# Patient Record
Sex: Male | Born: 1945 | Race: Black or African American | Hispanic: No | Marital: Single | Smoking: Never smoker
Health system: Southern US, Community
[De-identification: ages and names within clinical notes are randomized; demographics above are authoritative.]

## PROBLEM LIST (undated history)

## (undated) DIAGNOSIS — C61 Malignant neoplasm of prostate: Secondary | ICD-10-CM

## (undated) DIAGNOSIS — I639 Cerebral infarction, unspecified: Secondary | ICD-10-CM

## (undated) DIAGNOSIS — IMO0002 Reserved for concepts with insufficient information to code with codable children: Secondary | ICD-10-CM

## (undated) DIAGNOSIS — IMO0001 Reserved for inherently not codable concepts without codable children: Secondary | ICD-10-CM

## (undated) DIAGNOSIS — C7951 Secondary malignant neoplasm of bone: Secondary | ICD-10-CM

## (undated) DIAGNOSIS — I10 Essential (primary) hypertension: Secondary | ICD-10-CM

## (undated) HISTORY — DX: Reserved for inherently not codable concepts without codable children: IMO0001

## (undated) HISTORY — PX: EYE SURGERY: SHX253

## (undated) HISTORY — DX: Reserved for concepts with insufficient information to code with codable children: IMO0002

---

## 1999-07-26 ENCOUNTER — Encounter: Payer: Self-pay | Admitting: Emergency Medicine

## 1999-07-26 ENCOUNTER — Emergency Department (HOSPITAL_COMMUNITY): Admission: EM | Admit: 1999-07-26 | Discharge: 1999-07-26 | Payer: Self-pay | Admitting: *Deleted

## 2000-03-18 ENCOUNTER — Emergency Department (HOSPITAL_COMMUNITY): Admission: EM | Admit: 2000-03-18 | Discharge: 2000-03-18 | Payer: Self-pay | Admitting: Emergency Medicine

## 2001-05-31 ENCOUNTER — Emergency Department (HOSPITAL_COMMUNITY): Admission: EM | Admit: 2001-05-31 | Discharge: 2001-05-31 | Payer: Self-pay | Admitting: Emergency Medicine

## 2001-06-25 ENCOUNTER — Emergency Department (HOSPITAL_COMMUNITY): Admission: EM | Admit: 2001-06-25 | Discharge: 2001-06-25 | Payer: Self-pay | Admitting: Emergency Medicine

## 2001-12-07 ENCOUNTER — Emergency Department (HOSPITAL_COMMUNITY): Admission: EM | Admit: 2001-12-07 | Discharge: 2001-12-07 | Payer: Self-pay | Admitting: Emergency Medicine

## 2002-04-05 ENCOUNTER — Emergency Department (HOSPITAL_COMMUNITY): Admission: EM | Admit: 2002-04-05 | Discharge: 2002-04-05 | Payer: Self-pay | Admitting: Emergency Medicine

## 2003-08-18 ENCOUNTER — Emergency Department (HOSPITAL_COMMUNITY): Admission: EM | Admit: 2003-08-18 | Discharge: 2003-08-18 | Payer: Self-pay | Admitting: Emergency Medicine

## 2004-04-30 ENCOUNTER — Emergency Department (HOSPITAL_COMMUNITY): Admission: EM | Admit: 2004-04-30 | Discharge: 2004-04-30 | Payer: Self-pay | Admitting: Emergency Medicine

## 2004-05-02 ENCOUNTER — Emergency Department (HOSPITAL_COMMUNITY): Admission: EM | Admit: 2004-05-02 | Discharge: 2004-05-02 | Payer: Self-pay | Admitting: Emergency Medicine

## 2005-10-12 ENCOUNTER — Observation Stay (HOSPITAL_COMMUNITY): Admission: EM | Admit: 2005-10-12 | Discharge: 2005-10-13 | Payer: Self-pay | Admitting: Emergency Medicine

## 2007-08-04 ENCOUNTER — Emergency Department (HOSPITAL_COMMUNITY): Admission: EM | Admit: 2007-08-04 | Discharge: 2007-08-04 | Payer: Self-pay | Admitting: Emergency Medicine

## 2007-10-06 ENCOUNTER — Emergency Department (HOSPITAL_COMMUNITY): Admission: EM | Admit: 2007-10-06 | Discharge: 2007-10-06 | Payer: Self-pay | Admitting: Emergency Medicine

## 2007-10-23 ENCOUNTER — Emergency Department (HOSPITAL_COMMUNITY): Admission: EM | Admit: 2007-10-23 | Discharge: 2007-10-24 | Payer: Self-pay | Admitting: Emergency Medicine

## 2007-10-31 ENCOUNTER — Emergency Department (HOSPITAL_COMMUNITY): Admission: EM | Admit: 2007-10-31 | Discharge: 2007-10-31 | Payer: Self-pay | Admitting: Emergency Medicine

## 2008-01-09 ENCOUNTER — Emergency Department (HOSPITAL_COMMUNITY): Admission: EM | Admit: 2008-01-09 | Discharge: 2008-01-09 | Payer: Self-pay | Admitting: Emergency Medicine

## 2010-02-01 ENCOUNTER — Emergency Department (HOSPITAL_COMMUNITY): Admission: EM | Admit: 2010-02-01 | Discharge: 2010-02-01 | Payer: Self-pay | Admitting: Emergency Medicine

## 2011-01-04 NOTE — H&P (Signed)
Ricky Terry, Ricky Terry NO.:  [id number]   MEDICAL RECORD NO.:  [id number]          PATIENT TYPE:  INP   LOCATION:  1823                         FACILITY:  [facility]   PHYSICIAN:  Michaelyn Barter, M.D. DATE OF BIRTH:  01/20/1946   DATE OF ADMISSION:  10/11/2005  DATE OF DISCHARGE:                                HISTORY & PHYSICAL   PRIMARY CARE PHYSICIAN:  Dr. Andi Devon.   CHIEF COMPLAINT:  Light-headed.   HISTORY OF PRESENT ILLNESS:  The patient is a 65 year old male with a past  medical history of diabetes mellitus, who states that he became light-headed  at approximately 1 o'clock today.  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  The  episode lasted for approximately 2-3 minutes and then resolved.  He later  went home to lay down at approximately 4 o'clock.  He woke up an hour or two  later and states that he jumped up quickly to get out of bed to go to the  bathroom, and when he initially jumped up out of bed, he felt light-headed.  Again, this only lasted for approximately two minutes and had completely  resolved by the time he went to the bathroom and returned.  He did not pass  out during either these events.  He did not stumble or fall.  He denies  having any chest pain, no shortness of breath, no nausea or vomiting, fevers  or chills.  He did have two loose stools today.  No blood was present in his  stools.  He currently states that he feels back to his baseline.  He went on  to state that he has not been eating foods or drinking fluids as regularly  as he should.  He has not started any new meds recently.  No polyuria, no  dysuria.   PAST MEDICAL HISTORY:  Diabetes mellitus diagnosed 16 years ago.   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   ALLERGIES:  No known allergies.   HOME MEDICATIONS:  The patient  states he takes MediGlybe.   SOCIAL HISTORY:  1.  Cigarettes:  The patient stopped smoking approximately two years ago.      He started smoking at the age of 59.  He smoked up to 10 sticks a day.  2.  Alcohol:  The patient stopped drinking 22 years ago.  Started drinking      at the age of 64.  He drank primarily on the weekends.  3.  Street drugs:  The patient denies.   REVIEW OF SYSTEMS:  As per HPI.   PHYSICAL EXAMINATION:  The patient is awake, he is cooperative, he shows no  obvious distress.  VITALS:  Temperature 97.9, blood pressure 142/78, heart  rate 86, respirations 18, O2 saturation is 98% on room air.  HEENT:  Normocephalic, atraumatic.  Anicteric.  Extraocular movements are  intact.  Pupils are equally reactive to light.  Right eye has a prosthesis  present.  Dentures are present within the upper and lower regions of the  patient's mouth.  NECK:  Supple. No JVD.  Strong carotid upstrokes bilaterally.  No bruits.  No supraclavicular bruits.  No lymphadenopathy.  Thyroid is not palpable.  CARDIAC:  S1, S2 is present.  Regular rate and rhythm.  No murmur, gallops  or rubs.  No parasternal heave.  PMI was not appreciated.  RESPIRATORY:  Lungs are clear.  ABDOMEN:  Soft, nontender, nondistended.  Positive bowel sounds.  No masses.  EXTREMITIES:  No edema.  NEUROLOGIC:  The patient is alert and oriented x3.  Cranial nerves II-XII  are intact.  MUSCULOSKELETAL:  Upper and lower extremity strength 5/5.   LABS:  ABG was completed, pH 7.383, PCO2 41.1, bicarb 24.5.  Hemoglobin  13.3, hematocrit 39.0.  PT 11.7, INR 0.8, PTT 28.  Sodium 137, potassium  3.9, chloride 107, BUN 17, creatinine 0.8, glucose 165.  CK MB point of  care, 3.4, troponin 9, point of care less than 0.05, myoglobin point of care  93.6.  Urinalysis negative.   ASSESSMENT/PLAN:  Problem 1.  Presyncope.  The etiology of this is  questionable.  From the description that the patient gives, it sounds as  though  there may be an orthostatic component to his presentation.  We will  evaluate the patient from a cardiovascular versus noncardiovascular  perspective.  Will cycle the patient's cardiac enzymes x3 every 8 hours  apart, we will check a 2-D echocardiogram and also carotid Dopplers.  Problem 2.  EKG changes.  When I reviewed the patient's EKG, it shows T  waves within leads V1 with T wave inversions in V3, V4, V5, and V6.  Likewise leads 2, 3, and AVF also have nice sized T wave inversions.  The  acuity of this EKG, however, is questionable.  There are no old EKGs to  compare this one to.  The patient  is asymptomatic with regards to not  complaining of achest pain or shortness of breath.  Of concern, however, is  the patient is diabetic and diabetics in general tend to have atypical  presentations of cardiovascular disease.  Will again cycle the patient's  cardiac enzymes x3 every 8 hours apart.  Will repeat the EKG and will  monitor closely for now.  In addition, we will also start the patient on an  aspirin, and check a fasting lipid profile  Problem 3. History of diabetes mellitus.  Will verify the patient's home  medications and dose, then resume.  Will provide Accu-Cheks for now in  addition to sliding scale insulin.  Problem 4. Elevated blood pressure.  Will consider starting antihypertensive  medications, either ACE inhibitor or ARB.  Problem 5.  GI prophylaxis.  Will provide Protonix.      Michaelyn Barter, M.D.  Electronically Signed     OR/MEDQ  D:  10/12/2005  T:  10/12/2005  Job:  [id number]   cc:   [name]. Renae Gloss, M.D.  Fax: [phone]

## 2011-01-04 NOTE — Discharge Summary (Signed)
Ricky Terry, Ricky Terry                ACCOUNT NO.:  [id number]   MEDICAL RECORD NO.:  [id number]          PATIENT TYPE:  OBV   LOCATION:  5506                         FACILITY:  [facility]   PHYSICIAN:  Ricky Terry, M.D.DATE OF BIRTH:  July 13, 1946   DATE OF ADMISSION:  10/11/2005  DATE OF DISCHARGE:  10/13/2005                                 DISCHARGE SUMMARY   FINAL DIAGNOSIS:  1.  Presyncope.  2.  Abnormal electrocardiogram.  3.  Diabetes mellitus, uncontrolled, type 2.  4.  Hypertension.  5.  Hyperlipidemia.   PROCEDURE:  None.   CONSULTATIONS:  Cardiology consult, Dr. Orpah Cobb who is covering for Dr.  Donia Guiles.   BRIEF HISTORY:  [name] is a 65 year old African American male with a  history of diabetes mellitus who presented with light headedness on the day  of admission.  He had two episodes of light headedness, but he did not pass  out.  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  The second episode was at home  on his way to the bathroom.  There was no associated chest pain, shortness  of breath, nausea and vomiting, fever.  Hence, he was admitted for further  evaluation.   PAST MEDICAL HISTORY:  Significant for diabetes mellitus for 16 years.   EKG showed normal sinus rhythm with T-wave inversion in all leads except AVR  and AVL.  There was no ST elevation.  His cardiac markers at the point of  care were all negative.   Vital signs revealed blood pressure 142/78, respiratory rate 18, O2  saturations 98%.  The patient was not orthostatic.   HOSPITAL COURSE:  Problem 1:  Pre-syncope.  [name] was admitted to telemetry and there  was no abnormal rhythm on telemetry for 48 hours.  He was chest pain free.  The patient was not orthostatic.  Cardiac panels x 3 were all negative.  Cardiology consult was obtained.  Dr. Algie Coffer saw the patient and  recommended stress test, however, it  was cancelled because of the weekend  limitations.  The patient wanted to go home, hence, it was arranged to have  outpatient stress test with Dr. Shana Chute.   Problem 2:  Diabetes mellitus.  Hemoglobin A1C was 9.3.  It was felt that  the patient's diabetic control was not optimal.  He has been on Metformin  5/500 one four times a day, Actos 30 mg was added for treatment.   Problem 3:  Hyperlipidemia.  This is a new diagnosis.  Fasting lipid profile  revealed total cholesterol 243, triglyceride 77, HDL 61, LDL 167.  He was  started on Lipitor 40 mg daily.   Problem 4:  Hypertension.  Initial blood pressure on admission was 142/78  and it ranged between 138 and 147 systolic during the course of  hospitalization.  The decision was made to start on ARB and Avapro 150 mg  was initiated.   CONDITION ON DISCHARGE:  The patient was ambulating without dizziness or  symptoms.  He was chest pain free.   RECOMMENDATIONS:  Outpatient stress test with Dr. Donia Guiles.   FOLLOW UP:  Follow up with Dr. Renae Gloss as previously scheduled.   DISCHARGE MEDICATIONS:  Aspirin 325 mg p.o. daily, Metformin 5/500 one four  times a day as before, Lipitor 40 mg daily, Actos 30 mg daily, Avapro 150 mg  daily.   DISCHARGE LABORATORY DATA:  Sodium 138, potassium 4.8, chloride 110, bicarb  23, BUN 16, creatinine 0.6, white count 6, hemoglobin 12.2, hematocrit 36.3,  platelets 246, MCV 79, total cholesterol 273, triglycerides 77, HDL 51, LDL  157, hemoglobin A1C 9.3.      Ricky B. Corky Downs, M.D.  Electronically Signed     MBB/MEDQ  D:  10/23/2005  T:  10/23/2005  Job:  [id number]   cc:   [name]. Renae Gloss, M.D.  Fax: [phone]   Osvaldo Shipper. Spruill, M.D.  Fax: [phone]

## 2011-03-10 ENCOUNTER — Emergency Department (HOSPITAL_COMMUNITY): Payer: Self-pay

## 2011-03-10 ENCOUNTER — Emergency Department (HOSPITAL_COMMUNITY)
Admission: EM | Admit: 2011-03-10 | Discharge: 2011-03-10 | Disposition: A | Discharge status: Home / Self Care | Payer: Self-pay | Attending: Emergency Medicine | Admitting: Emergency Medicine

## 2011-03-10 DIAGNOSIS — R112 Nausea with vomiting, unspecified: Secondary | ICD-10-CM | POA: Insufficient documentation

## 2011-03-10 DIAGNOSIS — E119 Type 2 diabetes mellitus without complications: Secondary | ICD-10-CM | POA: Insufficient documentation

## 2011-03-10 DIAGNOSIS — Z79899 Other long term (current) drug therapy: Secondary | ICD-10-CM | POA: Insufficient documentation

## 2011-03-10 DIAGNOSIS — I10 Essential (primary) hypertension: Secondary | ICD-10-CM | POA: Insufficient documentation

## 2011-03-10 DIAGNOSIS — R51 Headache: Secondary | ICD-10-CM | POA: Insufficient documentation

## 2011-03-10 DIAGNOSIS — H5440 Blindness, one eye, unspecified eye: Secondary | ICD-10-CM | POA: Insufficient documentation

## 2011-03-10 DIAGNOSIS — E785 Hyperlipidemia, unspecified: Secondary | ICD-10-CM | POA: Insufficient documentation

## 2011-03-10 LAB — DIFFERENTIAL
Basophils Absolute: 0 10*3/uL (ref 0.0–0.1)
Basophils Relative: 0 % (ref 0–1)
Eosinophils Absolute: 0 10*3/uL (ref 0.0–0.7)
Eosinophils Relative: 0 % (ref 0–5)
Lymphocytes Relative: 12 % (ref 12–46)
Lymphs Abs: 0.7 10*3/uL (ref 0.7–4.0)
Monocytes Absolute: 0.1 10*3/uL (ref 0.1–1.0)
Monocytes Relative: 2 % — ABNORMAL LOW (ref 3–12)
Neutro Abs: 5.3 10*3/uL (ref 1.7–7.7)
Neutrophils Relative %: 86 % — ABNORMAL HIGH (ref 43–77)

## 2011-03-10 LAB — BASIC METABOLIC PANEL
GFR calc Af Amer: >60 mL/min (ref >60)
GFR calc non Af Amer: >60 mL/min (ref >60)
Potassium: 4.1 mEq/L (ref 3.5–5.1)
Sodium: 133 mEq/L — ABNORMAL LOW (ref 135–145)

## 2011-03-10 LAB — CBC
MCHC: 32.3 g/dL (ref 30.0–36.0)
Platelets: ADEQUATE 10*3/uL (ref 150–400)
RDW: 13.1 % (ref 11.5–15.5)

## 2011-05-09 ENCOUNTER — Emergency Department (HOSPITAL_COMMUNITY)
Admission: EM | Admit: 2011-05-09 | Discharge: 2011-05-10 | Disposition: A | Discharge status: Other Acute Inpatient Hospital | Payer: Self-pay | Source: Home / Self Care | Attending: Emergency Medicine | Admitting: Emergency Medicine

## 2011-05-09 ENCOUNTER — Emergency Department (HOSPITAL_COMMUNITY): Payer: Self-pay

## 2011-05-09 DIAGNOSIS — R5381 Other malaise: Secondary | ICD-10-CM | POA: Insufficient documentation

## 2011-05-09 DIAGNOSIS — R209 Unspecified disturbances of skin sensation: Secondary | ICD-10-CM | POA: Insufficient documentation

## 2011-05-09 DIAGNOSIS — G459 Transient cerebral ischemic attack, unspecified: Secondary | ICD-10-CM | POA: Insufficient documentation

## 2011-05-09 DIAGNOSIS — I10 Essential (primary) hypertension: Secondary | ICD-10-CM | POA: Insufficient documentation

## 2011-05-09 LAB — CBC
MCH: 26.7 pg (ref 26.0–34.0)
Platelets: 208 10*3/uL (ref 150–400)
RBC: 4.57 MIL/uL (ref 4.22–5.81)
WBC: 5.8 10*3/uL (ref 4.0–10.5)

## 2011-05-09 LAB — COMPREHENSIVE METABOLIC PANEL
Albumin: 3.5 g/dL (ref 3.5–5.2)
Alkaline Phosphatase: 40 U/L (ref 39–117)
BUN: 21 mg/dL (ref 6–23)
Calcium: 9.2 mg/dL (ref 8.4–10.5)
Potassium: 3.8 mEq/L (ref 3.5–5.1)
Sodium: 134 mEq/L — ABNORMAL LOW (ref 135–145)
Total Protein: 6.9 g/dL (ref 6.0–8.3)

## 2011-05-09 LAB — PROTIME-INR: Prothrombin Time: 12.5 seconds (ref 11.6–15.2)

## 2011-05-10 ENCOUNTER — Inpatient Hospital Stay (HOSPITAL_COMMUNITY): Payer: Self-pay

## 2011-05-10 ENCOUNTER — Inpatient Hospital Stay (HOSPITAL_COMMUNITY)
Admission: AD | Admit: 2011-05-10 | Discharge: 2011-05-11 | DRG: 069 | Disposition: A | Discharge status: Home / Self Care | Payer: Self-pay | Source: Other Acute Inpatient Hospital | Attending: Family Medicine | Admitting: Family Medicine

## 2011-05-10 DIAGNOSIS — E669 Obesity, unspecified: Secondary | ICD-10-CM | POA: Diagnosis present

## 2011-05-10 DIAGNOSIS — Z794 Long term (current) use of insulin: Secondary | ICD-10-CM

## 2011-05-10 DIAGNOSIS — Z79899 Other long term (current) drug therapy: Secondary | ICD-10-CM

## 2011-05-10 DIAGNOSIS — Z9001 Acquired absence of eye: Secondary | ICD-10-CM

## 2011-05-10 DIAGNOSIS — E8881 Metabolic syndrome: Secondary | ICD-10-CM | POA: Diagnosis present

## 2011-05-10 DIAGNOSIS — G459 Transient cerebral ischemic attack, unspecified: Principal | ICD-10-CM | POA: Diagnosis present

## 2011-05-10 DIAGNOSIS — Z9119 Patient's noncompliance with other medical treatment and regimen: Secondary | ICD-10-CM

## 2011-05-10 DIAGNOSIS — I10 Essential (primary) hypertension: Secondary | ICD-10-CM | POA: Diagnosis present

## 2011-05-10 DIAGNOSIS — E785 Hyperlipidemia, unspecified: Secondary | ICD-10-CM | POA: Diagnosis present

## 2011-05-10 DIAGNOSIS — Z7982 Long term (current) use of aspirin: Secondary | ICD-10-CM

## 2011-05-10 DIAGNOSIS — E119 Type 2 diabetes mellitus without complications: Secondary | ICD-10-CM | POA: Diagnosis present

## 2011-05-10 DIAGNOSIS — Z91199 Patient's noncompliance with other medical treatment and regimen due to unspecified reason: Secondary | ICD-10-CM

## 2011-05-10 LAB — GLUCOSE, CAPILLARY
Glucose-Capillary: 172 mg/dL — ABNORMAL HIGH (ref 70–99)
Glucose-Capillary: 292 mg/dL — ABNORMAL HIGH (ref 70–99)
Glucose-Capillary: 229 mg/dL — ABNORMAL HIGH (ref 70–99)

## 2011-05-10 LAB — HEMOGLOBIN A1C
Hgb A1c MFr Bld: 9.6 % — ABNORMAL HIGH (ref <5.7)
Mean Plasma Glucose: 229 mg/dL — ABNORMAL HIGH (ref <117)

## 2011-05-10 LAB — LIPID PANEL
HDL: 60 mg/dL (ref >39)
Total CHOL/HDL Ratio: 5.1 RATIO
Triglycerides: 181 mg/dL — ABNORMAL HIGH (ref <150)

## 2011-05-10 LAB — APTT: aPTT: 25 seconds (ref 24–37)

## 2011-05-10 LAB — CARDIAC PANEL(CRET KIN+CKTOT+MB+TROPI): Total CK: 136 U/L (ref 7–232)

## 2011-05-11 ENCOUNTER — Inpatient Hospital Stay (HOSPITAL_COMMUNITY): Payer: Self-pay

## 2011-05-11 LAB — GLUCOSE, CAPILLARY
Glucose-Capillary: 242 mg/dL — ABNORMAL HIGH (ref 70–99)
Glucose-Capillary: 214 mg/dL — ABNORMAL HIGH (ref 70–99)

## 2011-05-11 LAB — HEMOGLOBIN A1C
Hgb A1c MFr Bld: 9.9 % — ABNORMAL HIGH (ref <5.7)
Mean Plasma Glucose: 237 mg/dL — ABNORMAL HIGH (ref <117)

## 2011-05-11 LAB — COMPREHENSIVE METABOLIC PANEL
BUN: 17 mg/dL (ref 6–23)
CO2: 31 mEq/L (ref 19–32)
Calcium: 10.3 mg/dL (ref 8.4–10.5)
Creatinine, Ser: 0.83 mg/dL (ref 0.50–1.35)
GFR calc Af Amer: >60 mL/min (ref >60)
GFR calc non Af Amer: >60 mL/min (ref >60)
Glucose, Bld: 229 mg/dL — ABNORMAL HIGH (ref 70–99)
Total Bilirubin: 0.3 mg/dL (ref 0.3–1.2)

## 2011-05-11 NOTE — Consult Note (Signed)
NAMEMANDELA, Ricky Terry NO.:  [id number]  MEDICAL RECORD NO.:  [id number]  LOCATION:  3031                         FACILITY:  [facility]  PHYSICIAN:  Weston Settle, MD   DATE OF BIRTH:  July 28, 1946  DATE OF CONSULTATION:  05/10/2011 DATE OF DISCHARGE:                                CONSULTATION   REQUESTING PHYSICIAN:  [facility].  REASON FOR CONSULT:  TIA.  HISTORY OF PRESENT ILLNESS:  The patient is a 65 year old African American male who has had 2 episodes of right face, arm and leg weakness and numbness, each lasting 5 minutes yesterday.  This was associated with slurred speech and he came to the [facility] ER and then was transferred to [facility].  CT scan was performed, which I have reviewed personally, indicated no acute hemorrhages.  There is no hypodensities. There is effacement of the left lateral ventricle.  This was present in the past as well and may simply be artifactual in nature.  His CBC and complete metabolic panel are essentially normal except for glucose of 247, hemoglobin of 12 with MCV of 80, total cholesterol 305, triglycerides 181, HDL 60, LDL 209.  He was not taking any statin therapy.  He was also on no antiplatelet therapy at home.  He was not aware of any hypertension history, but did know that he was diabetic and was on treatment for that.  Cardiac enzymes were negative.  The patient was started on aspirin here in the hospital.  PAST MEDICAL HISTORY: 1. Diabetes. 2. Hyperlipidemia.  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  CURRENT MEDICATION:  In the hospital: 1. Aspirin 325 mg daily. 2. Lovenox 40 mg subcutaneously daily.  ALLERGIES:  NO KNOWN DRUG ALLERGIES.  SOCIAL HISTORY:  Denies smoking, alcohol or illicit drugs.  FAMILY HISTORY:  Noncontributory.  REVIEW OF SYSTEMS:  Reveals no fever, no meningismus, no diplopia, no dysphagia, no chest pain or  shortness of breath, no diarrhea or constipation.  All review of systems are negative.  PHYSICAL EXAMINATION:  VITAL SIGNS:  Blood pressure is 138/82, pulse of 80, temperature 97 degrees Fahrenheit. HEART:  Regular rate and rhythm.  S1 and S2.  No murmurs. LUNGS:  Clear to auscultation.  There are no carotid bruits. NEUROLOGIC:  Awake and alert.  Affect is normal.  She is fully oriented. Language is fluent.  Comprehension, naming, and repetition are intact. Pupils are equal and reactive.  Extraocular movements are intact.  Face is symmetrical.  Tongue is midline. MUSCULOSKELETAL:  Strength is 5/5 bilaterally in upper and lower extremities and all muscle groups.  Reflexes are +2 and symmetrical. Sensation is intact to all modalities.  Coordination is fully intact. There is no Babinski sign.  There is no Hoffman sign. There are no skin rashes.  His gait is normal including his tandem gait.  IMPRESSION/PLAN:  This is a 65 year old with 2 definite risk factors for stroke including elevated cholesterol and diabetes, who presents with 2 brief periods of transient ischemic attack, likely involving the left internal capsule.  His blood pressure is currently well-controlled off medication and therefore, he may never  have had an essential hypertension.  His diabetic medications should be restarted and adjusted based on his hemoglobin A1c level.  I do recommend starting a statin for his hypercholesterolemia with a goal LDL being less than 100.  The patient does not smoke and denies any use of cocaine or amphetamines.  I do recommend doing an MR angiogram of the brain to assess his intracranial vasculature for any stenosis for risk factor stratification.  He is going to be in the MR machine and MRI could also be obtained at the same time, but expect that to be normal since this was a transient ischemic attack.  He should also receive testing for carotid Doppler ultrasound and transthoracic  echocardiogram once again to establish risk-factor stratification.  The patient is started on aspirin and I agree with that, but the dose should be decreased to 81 mg a day to reduce GI toxicity.          ______________________________ Weston Settle, MD     SE/MEDQ  D:  05/10/2011  T:  05/10/2011  Job:  [id number]  Electronically Signed by Weston Settle MD on 05/11/2011 04:24:15 PM

## 2011-05-14 LAB — GLUCOSE, CAPILLARY: Glucose-Capillary: 262 mg/dL — ABNORMAL HIGH (ref 70–99)

## 2011-05-15 LAB — URINALYSIS, ROUTINE W REFLEX MICROSCOPIC
Bilirubin Urine: NEGATIVE
Ketones, ur: NEGATIVE
Nitrite: NEGATIVE
Protein, ur: NEGATIVE
Urobilinogen, UA: 0.2

## 2011-05-15 LAB — POCT I-STAT, CHEM 8: Chloride: 104

## 2011-05-20 NOTE — Discharge Summary (Signed)
[name], [name] NO.:  [id number]  MEDICAL RECORD NO.:  [id number]  LOCATION:  3031                         FACILITY:  [facility]  PHYSICIAN:  Pleas Koch, MD        DATE OF BIRTH:  1946-08-15  DATE OF ADMISSION:  05/10/2011 DATE OF DISCHARGE:                              DISCHARGE SUMMARY   DISCHARGE DIAGNOSES: 1. Transient ischemic attack, likely involving left internal capsule. 2. Diabetes mellitus with an A1c of 9.6. 3. Hyperlipidemia with LDL of 209 and total cholesterol 305,     triglycerides 181. 4. Obesity and metabolic syndrome. 5. Hypertension which is moderately controlled only. 6. Medication noncompliance.  DISCHARGE MEDICATIONS:  Are as follows, 1. Metformin 1000 mg b.i.d.  I have discontinued his Januvia. 2. Crestor 20 mg at bedtime. 3. Insulin 70/30 Novolin 5 units b.i.d. (the patient to demonstrate     ease of use of this prior to discharge). 4. Aspirin 81 mg. 5. HCTZ 12.5 mg daily. 6. Blood glucose monitor, strips and lancet. 7. Hydrochlorothiazide 25 mg daily.  PERTINENT IMAGING STUDIES: 1. CT head May 09, 2011, showed no evidence of acute     intracranial abnormality. 2. MRI of head was not performed given the patient's extreme     claustrophobia and per Neurology's discretion, it can be pursued as     an outpatient. 3. Carotid ultrasound showed no ICA stenosis bilaterally. 4. Echocardiogram done on May 10, 2011, showed LVEF of 60% to     65% grade 1 diastolic dysfunction.  No regional wall motion     abnormalities in pattern, mild LVH.  HISTORY OF PRESENT ILLNESS:  Ricky Terry is a 65 year old African American male with two episodes of facial weakness who presented to [facility] Emergency Room 30 minutes to 1 hour with slurred speech and dense right hemiparesis and denied any palpitations, chest pain, or shortness of breath.  He had fully resolved on presentation to the emergency room, hence t-PA was administered.  The  patient was admitted by [facility] for further workup.  Pertinent positives on admission blood pressure 166/92, pulse 89, respirations 16, and temperature 93.  Facial symmetry, fluent speech.  Throat clear.  Uvula midline.  Right eye enucleation.  Neurologically, Babinski intact.  Intact strength bilaterally equal.  No murmurs, rubs, or gallops.  HOSPITAL COURSE: 1. This is 65 year old who had a ABCD 2 score of 5-6 presented with     TIA, which resolved completely.  He was worked up and had above     studies, which proved negative.  Unfortunately, he was very     claustrophobic and did not have an MRI.  Neurology was consulted     regarding the case and they recommended decreasing his dose of     aspirin from 325-81 mg with aggressive risk factor modification     inclusive of hypertension control, lipid control, and diabetes     control for microvascular issues.  He will need to follow up in     Neurology in the outpatient setting. 2. Hyperlipidemia.  His LDL cholesterol was actually 209, which was     high risk for stroke in addition to  microvascular complications.     He was started on Crestor and will continue on Crestor 20 mg at     bedtime.  If he is not able to afford this, a suitable alternative     would be Pravachol 40-80 mg per discussion with primary care     physician, Andi Devon. 3. Diabetes mellitus.  The patient states that he is conversant with     using insulin, but refuses to take it because it is too expensive.     I did thorough check of his medications and spoke with pharmacy.  I     discovered Januvia actually costs 100 dollars where as a vile of     70/30 Novolin insulin cost 24,080 cents.  I am discharging the     patient on that condition that the patient can give himself insulin     twice daily with 70/30 and insulin which I am starting at 5 units     b.i.d.  I have also given him prescription for blood glucose meter,     monitor, and strips.   The patient will use this strips and monitor     and will present himself to Dr. Renae Gloss with readings of his blood     sugars so that infant can either be up or down titrated.  The     patient is agreeable with this plan. 4. Diastolic dysfunction with EF as above.  He has diastolic     dysfunction and will likely need to have aggressive risk factor     modification in view of this.  He may benefit from being on a     nitrate versus, he may benefit from being on hydralazine versus an     ACE inhibitor for afterload reduction in the near future.  However,     I will leave this up to primary care physician, Dr. Renae Gloss who     knows him lot better than I do. 5. The patient is to call Dr. Renae Gloss for an appointment in 1-2 days     and get an appointment by Wednesday.  DISCHARGE PHYSICAL EXAMINATION:  VITAL SIGNS:  Temperature is 97.8, pulse of 86, respirations 18, blood pressure 146-158 over 80-76, and O2 sat is 98% on room air. GENERAL:  The patient was alert and oriented.  No chest pain, no shortness of breath, no nausea, no weakness, and no focal deficit. LUNGS:  CTAB. HEART:  S1, S2.  No murmurs, rubs, or gallops. ABDOMEN:  Soft, nontender, nondistended. LOWER EXTREMITIES:  Not swollen, non distended.  It was a pleasure taking care of this patient.  I spent over 30 minutes in time coordinating his care and discharge.          ______________________________ Pleas Koch, MD     JS/MEDQ  D:  05/11/2011  T:  05/11/2011  Job:  [id number]  cc:   [facility]  Electronically Signed by Pleas Koch MD on 05/20/2011 04:05:03 PM

## 2011-06-02 NOTE — H&P (Signed)
Ricky Terry, HOUSAND NO.:  [id number]  MEDICAL RECORD NO.:  [id number]  LOCATION:  WLED                         FACILITY:  [facility]  PHYSICIAN:  Houston Siren, MD           DATE OF BIRTH:  07-07-1946  DATE OF ADMISSION:  05/09/2011 DATE OF DISCHARGE:                             HISTORY & PHYSICAL   PRIMARY CARE PHYSICIAN:  [name]. Renae Gloss, MD  ADVANCE DIRECTIVE:  Full code.  REASON FOR ADMISSION:  TIA versus CVA of the dominant hemisphere.  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  He reports no headache, nausea, vomiting, or diaphoresis.  He denied any palpitations, chest pain, or shortness of breath.  Evaluation in the emergency room included an EKG which showed sinus rhythm at 88 with T-wave inversions in the precordial leads.  His head CT was negative.  He has a normal white count of 5800, hemoglobin of 12.2.  His INR is 0.91 and his blood sugar is 247.  Hospitalist was asked to admit the patient for TIA and stroke workup.  Currently, he is totally asymptomatic, back to his baseline.  PAST MEDICAL HISTORY:  Hypertension, hypercholesterolemia, diabetes, status post right eye enucleation, noncompliance.  CURRENT MEDICATIONS:  Januvia, metformin.  He is not on aspirin nor on any cholesterol medication.  FAMILY HISTORY:  Strong family history of cardiovascular disease including TIA, stroke.  SOCIAL HISTORY:  He denied tobacco, alcohol, or drug use.  He has children at his bedside.  ALLERGIES:  No known drug allergies.  PHYSICAL EXAMINATION:  VITAL SIGNS:  Blood pressure 156/82, pulse of 89, respiratory rate of 16, temperature 98.3. HEENT:  He has facial symmetry with fluent  speech.  Tongue is midline. He has upper denture.  Throat is clear.  Uvula elevates with phonation. His right eye is enucleated and has prosthesis.  Left eye reactive and his extraocular muscles intact. NECK:  Supple.  I did not hear any carotid bruit. CARDIAC:  Revealed S1 and S2, regular.  There was no murmur, rub, or gallop. LUNGS:  Clear. ABDOMEN:  Obese, nondistended, nontender. EXTREMITIES:  Showed no edema.  No calf tenderness.  He has good distal pulses bilaterally. SKIN:  Warm and dry. NEUROLOGIC: Babinski is flexion.  Strength equal bilaterally. PSYCHIATRIC:  Unremarkable as well.  PERTINENT DATA:  CAT scan is negative.  White count 5800, blood glucose 247, creatinine 0.85.  EKG, sinus rhythm.  IMPRESSION:  This is a 65 year old with ABCD2 score of 5-6, presents with transient ischemic attack or cerebrovascular accident of the dominant hemisphere.  He will need to be admitted to [facility] and will need a full stroke workup.  He was given an aspirin a day and he should continue on this.  We will check his lipid profile.  He will need echo of his heart, ultrasound of his carotid, MRI and MRA of his brain.  I told him it is important  to get his LDL to less than 70, and he needs behavior changes as we discussed.  He is stable otherwise, and we will give him insulin sliding scale.     Houston Siren, MD     PL/MEDQ  D:  05/09/2011  T:  05/09/2011  Job:  [id number]  Electronically Signed by Houston Siren  on 06/02/2011 02:55:15 PM

## 2011-07-06 ENCOUNTER — Other Ambulatory Visit: Payer: Self-pay

## 2011-07-06 ENCOUNTER — Emergency Department (HOSPITAL_COMMUNITY)
Admission: EM | Admit: 2011-07-06 | Discharge: 2011-07-06 | Disposition: A | Discharge status: Home / Self Care | Payer: Self-pay | Attending: Emergency Medicine | Admitting: Emergency Medicine

## 2011-07-06 ENCOUNTER — Encounter: Payer: Self-pay | Admitting: Emergency Medicine

## 2011-07-06 DIAGNOSIS — R42 Dizziness and giddiness: Secondary | ICD-10-CM | POA: Insufficient documentation

## 2011-07-06 DIAGNOSIS — Z79899 Other long term (current) drug therapy: Secondary | ICD-10-CM | POA: Insufficient documentation

## 2011-07-06 DIAGNOSIS — E119 Type 2 diabetes mellitus without complications: Secondary | ICD-10-CM | POA: Insufficient documentation

## 2011-07-06 DIAGNOSIS — R739 Hyperglycemia, unspecified: Secondary | ICD-10-CM

## 2011-07-06 HISTORY — DX: Cerebral infarction, unspecified: I63.9

## 2011-07-06 LAB — BASIC METABOLIC PANEL
CO2: 25 mEq/L (ref 19–32)
Calcium: 9.9 mg/dL (ref 8.4–10.5)
Chloride: 98 mEq/L (ref 96–112)
Creatinine, Ser: 0.74 mg/dL (ref 0.50–1.35)
GFR calc Af Amer: >90 mL/min (ref >90)
Sodium: 134 mEq/L — ABNORMAL LOW (ref 135–145)

## 2011-07-06 LAB — CBC
HCT: 37.3 % — ABNORMAL LOW (ref 39.0–52.0)
MCV: 80.9 fL (ref 78.0–100.0)
Platelets: 218 10*3/uL (ref 150–400)
RBC: 4.61 MIL/uL (ref 4.22–5.81)
RDW: 13.4 % (ref 11.5–15.5)
WBC: 5.8 10*3/uL (ref 4.0–10.5)

## 2011-07-06 LAB — DIFFERENTIAL
Basophils Absolute: 0 10*3/uL (ref 0.0–0.1)
Eosinophils Relative: 2 % (ref 0–5)
Lymphocytes Relative: 28 % (ref 12–46)
Lymphs Abs: 1.7 10*3/uL (ref 0.7–4.0)
Neutro Abs: 3.6 10*3/uL (ref 1.7–7.7)

## 2011-07-06 LAB — GLUCOSE, CAPILLARY: Glucose-Capillary: 283 mg/dL — ABNORMAL HIGH (ref 70–99)

## 2011-07-06 LAB — POCT I-STAT TROPONIN I: Troponin i, poc: 0.01 ng/mL (ref 0.00–0.08)

## 2011-07-06 MED ORDER — HYDROCHLOROTHIAZIDE 25 MG PO TABS: 25.0000 mg | ORAL_TABLET | Freq: Every day | ORAL | Status: DC

## 2011-07-06 NOTE — ED Notes (Signed)
Pt given discharge instructions and verbalizes understanding  

## 2011-07-06 NOTE — ED Provider Notes (Addendum)
History     CSN: [id number] Arrival date & time: 07/06/2011 12:39 AM   First MD Initiated Contact with Patient 07/06/11 0057      Chief Complaint  Patient presents with  . Dizziness  . Weakness    (Consider location/radiation/quality/duration/timing/severity/associated sxs/prior treatment) HPI  Past Medical History  Diagnosis Date  . Stroke   . Diabetes mellitus     History reviewed. No pertinent past surgical history.  Family History  Problem Relation Age of Onset  . Cancer Mother   . Hypertension Mother   . Hypertension Father   . Cancer Father   . Diabetes Sister   . Hypertension Sister   . Hypertension Brother   . Diabetes Brother     History  Substance Use Topics  . Smoking status: Never Smoker   . Smokeless tobacco: Not on file  . Alcohol Use: No      Review of Systems  Allergies  Review of patient's allergies indicates no known allergies.  Home Medications   Current Outpatient Rx  Name Route Sig Dispense Refill  . HYDROCHLOROTHIAZIDE 25 MG PO TABS Oral Take 25 mg by mouth daily.      Marland Kitchen METFORMIN HCL 1000 MG PO TABS Oral Take 1,000 mg by mouth 2 (two) times daily with a meal.        BP 155/92  Pulse 97  Temp(Src) 97.9 F (36.6 C) (Oral)  Resp 18  SpO2 97%  Physical Exam  Neurological:       Grip strength is equal, no pronator drift, cranial nerves II through XII intact as tested, gait is examined, and is completely normal. Motor strength 5 out of 5 in upper and lower extremities. Reflexes. Normal. Essentially normal physical exam. Coordination is normal    ED Course  Procedures (including critical care time)  Labs Reviewed  GLUCOSE, CAPILLARY - Abnormal; Notable for the following:    Glucose-Capillary 283 (*)    All other components within normal limits  BASIC METABOLIC PANEL - Abnormal; Notable for the following:    Sodium 134 (*)    Glucose, Bld 284 (*)    All other components within normal limits  CBC - Abnormal; Notable for  the following:    Hemoglobin 12.2 (*)    HCT 37.3 (*)    All other components within normal limits  DIFFERENTIAL  POCT I-STAT TROPONIN I  POCT CBG MONITORING  I-STAT TROPONIN I   No results found.   No diagnosis found.    MDM  Pt is seen and examined;  Initial history and physical completed.  Will follow.          Markan Cazarez A. Patrica Duel, MD 07/06/11 0203  3:03 AM   Date: 07/06/2011  Rate: 87  Rhythm: normal sinus rhythm  QRS Axis: normal  Intervals: normal  ST/T Wave abnormalities: nonspecific ST changes  Conduction Disutrbances:incomplete RBBB  Narrative Interpretation:   Old EKG Reviewed: unchanged Diffuse ST elevation, which is compared to February of 2007, unchanged. Diffuse T-wave inversion also unchanged, abnormal, but unchanged EKG from previous studies.   3:10 AM The patient remains asymptomatic. Will be discharged home in stable condition. Strongly encouraged to followup with his primary care doctor. Patient knows to return to the ED for any concerns or recurring symptoms.    Ronny Korff A. Patrica Duel, MD 07/06/11 [id number]

## 2011-07-06 NOTE — ED Notes (Signed)
Pt states he is having weakness in his lower extremities and feels dizzy  Pt states he had a recent stroke so he wanted to come in for evaluation

## 2011-07-06 NOTE — ED Notes (Signed)
Pt to ED with concerns of feeling dizziness and having some weakness in his legs that have now subsided. Pt states recently had a stroke and wanted to be eval. Pt also reports an elevation in his FSBS. Pt in gown. Pt awaits res eval

## 2011-10-25 ENCOUNTER — Emergency Department (HOSPITAL_COMMUNITY): Payer: Self-pay

## 2011-10-25 ENCOUNTER — Other Ambulatory Visit: Payer: Self-pay

## 2011-10-25 ENCOUNTER — Emergency Department (HOSPITAL_COMMUNITY)
Admission: EM | Admit: 2011-10-25 | Discharge: 2011-10-25 | Disposition: A | Discharge status: Home / Self Care | Payer: Self-pay | Attending: Emergency Medicine | Admitting: Emergency Medicine

## 2011-10-25 ENCOUNTER — Encounter (HOSPITAL_COMMUNITY): Payer: Self-pay | Admitting: Emergency Medicine

## 2011-10-25 DIAGNOSIS — R42 Dizziness and giddiness: Secondary | ICD-10-CM | POA: Insufficient documentation

## 2011-10-25 DIAGNOSIS — Z79899 Other long term (current) drug therapy: Secondary | ICD-10-CM | POA: Insufficient documentation

## 2011-10-25 DIAGNOSIS — R05 Cough: Secondary | ICD-10-CM | POA: Insufficient documentation

## 2011-10-25 DIAGNOSIS — I10 Essential (primary) hypertension: Secondary | ICD-10-CM | POA: Insufficient documentation

## 2011-10-25 DIAGNOSIS — Z8673 Personal history of transient ischemic attack (TIA), and cerebral infarction without residual deficits: Secondary | ICD-10-CM | POA: Insufficient documentation

## 2011-10-25 DIAGNOSIS — R079 Chest pain, unspecified: Secondary | ICD-10-CM | POA: Insufficient documentation

## 2011-10-25 DIAGNOSIS — R059 Cough, unspecified: Secondary | ICD-10-CM | POA: Insufficient documentation

## 2011-10-25 DIAGNOSIS — E119 Type 2 diabetes mellitus without complications: Secondary | ICD-10-CM | POA: Insufficient documentation

## 2011-10-25 HISTORY — DX: Essential (primary) hypertension: I10

## 2011-10-25 LAB — POCT I-STAT TROPONIN I

## 2011-10-25 LAB — POCT I-STAT, CHEM 8
BUN: 13 mg/dL (ref 6–23)
Chloride: 104 mEq/L (ref 96–112)
Creatinine, Ser: 0.7 mg/dL (ref 0.50–1.35)
Glucose, Bld: 249 mg/dL — ABNORMAL HIGH (ref 70–99)
Hemoglobin: 12.9 g/dL — ABNORMAL LOW (ref 13.0–17.0)
Potassium: 4.2 mEq/L (ref 3.5–5.1)
Sodium: 137 mEq/L (ref 135–145)

## 2011-10-25 LAB — DIFFERENTIAL
Eosinophils Absolute: 0.1 10*3/uL (ref 0.0–0.7)
Lymphocytes Relative: 36 % (ref 12–46)
Lymphs Abs: 1.9 10*3/uL (ref 0.7–4.0)
Monocytes Relative: 6 % (ref 3–12)
Neutro Abs: 2.9 10*3/uL (ref 1.7–7.7)
Neutrophils Relative %: 56 % (ref 43–77)

## 2011-10-25 LAB — CBC
Hemoglobin: 12.2 g/dL — ABNORMAL LOW (ref 13.0–17.0)
Platelets: 201 10*3/uL (ref 150–400)
RBC: 4.7 MIL/uL (ref 4.22–5.81)
WBC: 5.2 10*3/uL (ref 4.0–10.5)

## 2011-10-25 MED ORDER — LISINOPRIL 10 MG PO TABS: 10.0000 mg | ORAL_TABLET | Freq: Every day | ORAL | Status: DC

## 2011-10-25 NOTE — ED Notes (Signed)
Pt presented to the ER with c/o dizziness and HTN, pt states this morning BP was elevated in 180, pt has medication but not taking since he has some side effects, states he did inform his PCP, but they still did not change the meds.

## 2011-10-25 NOTE — ED Provider Notes (Signed)
And is now his second injection at the below-knee and a 20 cc less than 1 History     CSN: [id number]  Arrival date & time 10/25/11  2130   First MD Initiated Contact with Patient 10/25/11 0505      Chief Complaint  Patient presents with  . Dizziness  . Hypertension    (Consider location/radiation/quality/duration/timing/severity/associated sxs/prior treatment) Patient is a 66 y.o. male presenting with hypertension. The history is provided by the patient.  Hypertension This is a new problem. Associated symptoms include chest pain. Pertinent negatives include no abdominal pain, no headaches and no shortness of breath.   patient stated he felt dizzy had hypertension since last night. He states this morning his blood pressure was elevated 280. He is posterior be on hydrochlorothiazide, states she's been having side effects and he have not changed it. He states he has had an occasional episode of dull chest pain. He does not have chest pain now. He states he feels dizzy when his blood pressure goes up. Is no headache. No localizing numbness or weakness. No confusion.  Past Medical History  Diagnosis Date  . Stroke   . Diabetes mellitus   . Hypertension     History reviewed. No pertinent past surgical history.  Family History  Problem Relation Age of Onset  . Cancer Mother   . Hypertension Mother   . Hypertension Father   . Cancer Father   . Diabetes Sister   . Hypertension Sister   . Hypertension Brother   . Diabetes Brother     History  Substance Use Topics  . Smoking status: Never Smoker   . Smokeless tobacco: Not on file  . Alcohol Use: No      Review of Systems  Constitutional: Negative for activity change and appetite change.  HENT: Negative for neck stiffness.   Eyes: Negative for pain.  Respiratory: Negative for chest tightness and shortness of breath.   Cardiovascular: Positive for chest pain. Negative for leg swelling.  Gastrointestinal: Negative for  nausea, vomiting, abdominal pain and diarrhea.  Genitourinary: Negative for flank pain.  Musculoskeletal: Negative for back pain.  Skin: Negative for rash.  Neurological: Positive for dizziness. Negative for weakness, numbness and headaches.  Psychiatric/Behavioral: Negative for behavioral problems.    Allergies  Review of patient's allergies indicates no known allergies.  Home Medications   Current Outpatient Rx  Name Route Sig Dispense Refill  . HYDROCHLOROTHIAZIDE 12.5 MG PO CAPS Oral Take 12.5 mg by mouth daily.    Marland Kitchen METFORMIN HCL 1000 MG PO TABS Oral Take 1,000 mg by mouth 2 (two) times daily with a meal.      . LISINOPRIL 10 MG PO TABS Oral Take 1 tablet (10 mg total) by mouth daily. 14 tablet 0    BP 142/66  Pulse 79  Temp(Src) 97.3 F (36.3 C) (Oral)  Resp 16  Ht 5\' 6"  (1.676 m)  Wt 161 lb (73.029 kg)  BMI 25.99 kg/m2  SpO2 99%  Physical Exam  Nursing note and vitals reviewed. Constitutional: He is oriented to person, place, and time. He appears well-developed and well-nourished.  HENT:  Head: Normocephalic and atraumatic.  Eyes: EOM are normal. Pupils are equal, round, and reactive to light.  Cardiovascular: Normal rate, regular rhythm and normal heart sounds.   No murmur heard. Pulmonary/Chest: Effort normal and breath sounds normal.  Abdominal: Soft. Bowel sounds are normal. He exhibits no distension and no mass. There is no tenderness. There is no rebound and  no guarding.  Musculoskeletal: Normal range of motion. He exhibits no edema.  Neurological: He is alert and oriented to person, place, and time. No cranial nerve deficit.  Skin: Skin is warm and dry.  Psychiatric: He has a normal mood and affect.    ED Course  Procedures (including critical care time)  Labs Reviewed  CBC - Abnormal; Notable for the following:    Hemoglobin 12.2 (*)    HCT 37.4 (*)    All other components within normal limits  POCT I-STAT, CHEM 8 - Abnormal; Notable for the  following:    Glucose, Bld 249 (*)    Hemoglobin 12.9 (*)    HCT 38.0 (*)    All other components within normal limits  DIFFERENTIAL  POCT I-STAT TROPONIN I   Dg Chest 2 View  10/25/2011  *RADIOLOGY REPORT*  Clinical Data: Mid chest pain, cough, past history of stroke, diabetes, hypertension  CHEST - 2 VIEW  Comparison: None  Findings: Normal heart size, mediastinal contours, and pulmonary vascularity. Lungs clear. No pleural effusion or pneumothorax. Osseous structures normal appearance.  IMPRESSION: Normal exam.  Original Report Authenticated By: Lollie Marrow, M.D.     1. Hypertension     Date: 10/25/2011  Rate: 76  Rhythm: normal sinus rhythm  QRS Axis: normal  Intervals: normal  ST/T Wave abnormalities: nonspecific ST/T changes  Conduction Disutrbances:none  Narrative Interpretation: ST T wave changes in multiple leads. It has been stable between police to previous EKGs though.  Old EKG Reviewed: unchanged     MDM  Hypertension at home. His been off his medications because he states it feels like the light turned in his eyes when he takes it. No signs of endorgan damage. His EKG is abnormal but unchanged. He'll be started on lisinopril and sent home to follow with Dr. Renae Gloss.        Juliet Rude. Rubin Payor, MD 10/25/11 [phone]

## 2011-10-25 NOTE — Discharge Instructions (Signed)
Arterial Hypertension Arterial hypertension (high blood pressure) is a condition of elevated pressure in your blood vessels. Hypertension over a long period of time is a risk factor for strokes, heart attacks, and heart failure. It is also the leading cause of kidney (renal) failure.  CAUSES   In Adults -- Over 90% of all hypertension has no known cause. This is called essential or primary hypertension. In the other 10% of people with hypertension, the increase in blood pressure is caused by another disorder. This is called secondary hypertension. Important causes of secondary hypertension are:   Heavy alcohol use.   Obstructive sleep apnea.   Hyperaldosterosim (Conn's syndrome).   Steroid use.   Chronic kidney failure.   Hyperparathyroidism.   Medications.   Renal artery stenosis.   Pheochromocytoma.   Cushing's disease.   Coarctation of the aorta.   Scleroderma renal crisis.   Licorice (in excessive amounts).   Drugs (cocaine, methamphetamine).  Your caregiver can explain any items above that apply to you.  In Children -- Secondary hypertension is more common and should always be considered.   Pregnancy -- Few women of childbearing age have high blood pressure. However, up to 10% of them develop hypertension of pregnancy. Generally, this will not harm the woman. It may be a sign of 3 complications of pregnancy: preeclampsia, HELLP syndrome, and eclampsia. Follow up and control with medication is necessary.  SYMPTOMS   This condition normally does not produce any noticeable symptoms. It is usually found during a routine exam.   Malignant hypertension is a late problem of high blood pressure. It may have the following symptoms:   Headaches.   Blurred vision.   End-organ damage (this means your kidneys, heart, lungs, and other organs are being damaged).   Stressful situations can increase the blood pressure. If a person with normal blood pressure has their blood  pressure go up while being seen by their caregiver, this is often termed "white coat hypertension." Its importance is not known. It may be related with eventually developing hypertension or complications of hypertension.   Hypertension is often confused with mental tension, stress, and anxiety.  DIAGNOSIS  The diagnosis is made by 3 separate blood pressure measurements. They are taken at least 1 week apart from each other. If there is organ damage from hypertension, the diagnosis may be made without repeat measurements. Hypertension is usually identified by having blood pressure readings:  Above 140/90 mmHg measured in both arms, at 3 separate times, over a couple weeks.   Over 130/80 mmHg should be considered a risk factor and may require treatment in patients with diabetes.  Blood pressure readings over 120/80 mmHg are called "pre-hypertension" even in non-diabetic patients. To get a true blood pressure measurement, use the following guidelines. Be aware of the factors that can alter blood pressure readings.  Take measurements at least 1 hour after caffeine.   Take measurements 30 minutes after smoking and without any stress. This is another reason to quit smoking - it raises your blood pressure.   Use a proper cuff size. Ask your caregiver if you are not sure about your cuff size.   Most home blood pressure cuffs are automatic. They will measure systolic and diastolic pressures. The systolic pressure is the pressure reading at the start of sounds. Diastolic pressure is the pressure at which the sounds disappear. If you are elderly, measure pressures in multiple postures. Try sitting, lying or standing.   Sit at rest for a minimum of   5 minutes before taking measurements.   You should not be on any medications like decongestants. These are found in many cold medications.   Record your blood pressure readings and review them with your caregiver.  If you have hypertension:  Your caregiver  may do tests to be sure you do not have secondary hypertension (see "causes" above).   Your caregiver may also look for signs of metabolic syndrome. This is also called Syndrome X or Insulin Resistance Syndrome. You may have this syndrome if you have type 2 diabetes, abdominal obesity, and abnormal blood lipids in addition to hypertension.   Your caregiver will take your medical and family history and perform a physical exam.   Diagnostic tests may include blood tests (for glucose, cholesterol, potassium, and kidney function), a urinalysis, or an EKG. Other tests may also be necessary depending on your condition.  PREVENTION  There are important lifestyle issues that you can adopt to reduce your chance of developing hypertension:  Maintain a normal weight.   Limit the amount of salt (sodium) in your diet.   Exercise often.   Limit alcohol intake.   Get enough potassium in your diet. Discuss specific advice with your caregiver.   Follow a DASH diet (dietary approaches to stop hypertension). This diet is rich in fruits, vegetables, and low-fat dairy products, and avoids certain fats.  PROGNOSIS  Essential hypertension cannot be cured. Lifestyle changes and medical treatment can lower blood pressure and reduce complications. The prognosis of secondary hypertension depends on the underlying cause. Many people whose hypertension is controlled with medicine or lifestyle changes can live a normal, healthy life.  RISKS AND COMPLICATIONS  While high blood pressure alone is not an illness, it often requires treatment due to its short- and long-term effects on many organs. Hypertension increases your risk for:  CVAs or strokes (cerebrovascular accident).   Heart failure due to chronically high blood pressure (hypertensive cardiomyopathy).   Heart attack (myocardial infarction).   Damage to the retina (hypertensive retinopathy).   Kidney failure (hypertensive nephropathy).  Your caregiver can  explain list items above that apply to you. Treatment of hypertension can significantly reduce the risk of complications. TREATMENT   For overweight patients, weight loss and regular exercise are recommended. Physical fitness lowers blood pressure.   Mild hypertension is usually treated with diet and exercise. A diet rich in fruits and vegetables, fat-free dairy products, and foods low in fat and salt (sodium) can help lower blood pressure. Decreasing salt intake decreases blood pressure in a 1/3 of people.   Stop smoking if you are a smoker.  The steps above are highly effective in reducing blood pressure. While these actions are easy to suggest, they are difficult to achieve. Most patients with moderate or severe hypertension end up requiring medications to bring their blood pressure down to a normal level. There are several classes of medications for treatment. Blood pressure pills (antihypertensives) will lower blood pressure by their different actions. Lowering the blood pressure by 10 mmHg may decrease the risk of complications by as much as 25%. The goal of treatment is effective blood pressure control. This will reduce your risk for complications. Your caregiver will help you determine the best treatment for you according to your lifestyle. What is excellent treatment for one person, may not be for you. HOME CARE INSTRUCTIONS   Do not smoke.   Follow the lifestyle changes outlined in the "Prevention" section.   If you are on medications, follow the directions   carefully. Blood pressure medications must be taken as prescribed. Skipping doses reduces their benefit. It also puts you at risk for problems.   Follow up with your caregiver, as directed.   If you are asked to monitor your blood pressure at home, follow the guidelines in the "Diagnosis" section above.  SEEK MEDICAL CARE IF:   You think you are having medication side effects.   You have recurrent headaches or lightheadedness.     You have swelling in your ankles.   You have trouble with your vision.  SEEK IMMEDIATE MEDICAL CARE IF:   You have sudden onset of chest pain or pressure, difficulty breathing, or other symptoms of a heart attack.   You have a severe headache.   You have symptoms of a stroke (such as sudden weakness, difficulty speaking, difficulty walking).  MAKE SURE YOU:   Understand these instructions.   Will watch your condition.   Will get help right away if you are not doing well or get worse.  Document Released: 08/05/2005 Document Revised: 07/25/2011 Document Reviewed: 03/05/2007 [employer] Patient Information 2012 [employer]. 

## 2011-10-31 ENCOUNTER — Emergency Department (HOSPITAL_COMMUNITY)
Admission: EM | Admit: 2011-10-31 | Discharge: 2011-10-31 | Discharge status: Against Medical Advice | Payer: Self-pay | Attending: Emergency Medicine | Admitting: Emergency Medicine

## 2011-10-31 DIAGNOSIS — E119 Type 2 diabetes mellitus without complications: Secondary | ICD-10-CM | POA: Insufficient documentation

## 2011-10-31 DIAGNOSIS — I10 Essential (primary) hypertension: Secondary | ICD-10-CM | POA: Insufficient documentation

## 2011-10-31 DIAGNOSIS — Z8673 Personal history of transient ischemic attack (TIA), and cerebral infarction without residual deficits: Secondary | ICD-10-CM | POA: Insufficient documentation

## 2011-12-24 ENCOUNTER — Emergency Department (HOSPITAL_COMMUNITY)
Admission: EM | Admit: 2011-12-24 | Discharge: 2011-12-25 | Disposition: A | Discharge status: Home / Self Care | Payer: Self-pay | Attending: Emergency Medicine | Admitting: Emergency Medicine

## 2011-12-24 ENCOUNTER — Encounter (HOSPITAL_COMMUNITY): Payer: Self-pay | Admitting: Emergency Medicine

## 2011-12-24 DIAGNOSIS — Z79899 Other long term (current) drug therapy: Secondary | ICD-10-CM | POA: Insufficient documentation

## 2011-12-24 DIAGNOSIS — R0981 Nasal congestion: Secondary | ICD-10-CM

## 2011-12-24 DIAGNOSIS — R059 Cough, unspecified: Secondary | ICD-10-CM | POA: Insufficient documentation

## 2011-12-24 DIAGNOSIS — R51 Headache: Secondary | ICD-10-CM | POA: Insufficient documentation

## 2011-12-24 DIAGNOSIS — R05 Cough: Secondary | ICD-10-CM | POA: Insufficient documentation

## 2011-12-24 DIAGNOSIS — J3489 Other specified disorders of nose and nasal sinuses: Secondary | ICD-10-CM | POA: Insufficient documentation

## 2011-12-24 DIAGNOSIS — Z8673 Personal history of transient ischemic attack (TIA), and cerebral infarction without residual deficits: Secondary | ICD-10-CM | POA: Insufficient documentation

## 2011-12-24 DIAGNOSIS — E119 Type 2 diabetes mellitus without complications: Secondary | ICD-10-CM | POA: Insufficient documentation

## 2011-12-24 DIAGNOSIS — I10 Essential (primary) hypertension: Secondary | ICD-10-CM | POA: Insufficient documentation

## 2011-12-24 NOTE — ED Notes (Signed)
Pt states he has nasal and head congestion making it hard for him to breathe  Pt states he has a lot of pressure in his head

## 2011-12-25 MED ORDER — FLUTICASONE PROPIONATE 50 MCG/ACT NA SUSP: 2.0000 | Freq: Every day | NASAL | Status: DC

## 2011-12-25 MED ORDER — AZITHROMYCIN 250 MG PO TABS: ORAL_TABLET | ORAL | Status: DC

## 2011-12-25 MED ORDER — CETIRIZINE-PSEUDOEPHEDRINE ER 5-120 MG PO TB12: 1.0000 | ORAL_TABLET | Freq: Every day | ORAL | Status: DC

## 2011-12-25 MED ORDER — GUAIFENESIN 100 MG/5ML PO LIQD: 100.0000 mg | ORAL | Status: DC | PRN

## 2011-12-25 MED ORDER — AZITHROMYCIN 250 MG PO TABS: ORAL_TABLET | ORAL | Status: AC

## 2011-12-25 NOTE — ED Provider Notes (Signed)
History     CSN: [id number]  Arrival date & time 12/24/11  2254   First MD Initiated Contact with Patient 12/25/11 0047      Chief Complaint  Patient presents with  . Nasal Congestion    HPI  History provided by the patient. Patient is a 66 year old male with history of hypertension and diabetes who presents complaints of nasal congestion and sinus pressure for the past 2 days. Symptoms began acutely and have been persistent. Patient reports difficulty breathing through his nose. Patient also reports having associated headache and occasional cough. Patient has been using Claritin and Afrin to help with symptoms without change. She denies history of seasonal allergies or similar symptoms in the past. Patient denies any fever, chills, sweats, nausea vomiting. Patient denies any known sick contacts.    Past Medical History  Diagnosis Date  . Stroke   . Diabetes mellitus   . Hypertension     History reviewed. No pertinent past surgical history.  Family History  Problem Relation Age of Onset  . Cancer Mother   . Hypertension Mother   . Hypertension Father   . Cancer Father   . Diabetes Sister   . Hypertension Sister   . Hypertension Brother   . Diabetes Brother     History  Substance Use Topics  . Smoking status: Never Smoker   . Smokeless tobacco: Not on file  . Alcohol Use: No      Review of Systems  Constitutional: Negative for fever, chills and appetite change.  HENT: Positive for congestion, rhinorrhea and sinus pressure. Negative for ear pain.   Respiratory: Negative for cough.   Gastrointestinal: Negative for nausea, vomiting and abdominal pain.  Neurological: Positive for headaches.    Allergies  Review of patient's allergies indicates no known allergies.  Home Medications   Current Outpatient Rx  Name Route Sig Dispense Refill  . LISINOPRIL 10 MG PO TABS Oral Take 1 tablet (10 mg total) by mouth daily. 14 tablet 0  . METFORMIN HCL 1000 MG PO TABS  Oral Take 1,000 mg by mouth 2 (two) times daily with a meal.        BP 164/82  Pulse 94  Temp(Src) 97.8 F (36.6 C) (Oral)  Resp 18  SpO2 97%  Physical Exam  Nursing note and vitals reviewed. Constitutional: He is oriented to person, place, and time. He appears well-developed and well-nourished. No distress.  HENT:  Head: Normocephalic and atraumatic.  Right Ear: Tympanic membrane normal.  Left Ear: Tympanic membrane normal.  Mouth/Throat: Oropharynx is clear and moist.       Swelling of nasal mucosa with drainage. Poor air movement bilaterally. Tenderness over bilateral maxillary sinuses.  Neck: Normal range of motion. Neck supple.  Cardiovascular: Normal rate and regular rhythm.   Pulmonary/Chest: Effort normal and breath sounds normal. No stridor.  Lymphadenopathy:    He has no cervical adenopathy.  Neurological: He is alert and oriented to person, place, and time.  Skin: Skin is warm.  Psychiatric: He has a normal mood and affect. His behavior is normal.    ED Course  Procedures     1. Nasal congestion       MDM  1:10AM Pt seen and evaluated. Patient no acute distress. Patient is afebrile and nontoxic.        Angus Seller, [place] 12/25/11 [phone]

## 2011-12-25 NOTE — Discharge Instructions (Signed)
You were seen and evaluated for your complaints of nasal congestion and pressure. Please use medications recommended by your providers today to help treat your symptoms. Please followup with your primary care provider for additional evaluation and treatment.   Saline Nose Drops  To help clear a stuffy nose, put salt water (saline) nose drops in your infant's nose. This helps to loosen the secretions in the nose. Use a bulb syringe to clean the nose out:  Before feeding.   Before putting your infant down for naps.   No more than once every 3 hours to avoid irritating your infant's nostrils.  HOME CARE  Buy nose drops at your local drug store. You can also make nose drops yourself. Mix 1 cup of water with  teaspoon of salt. Stir. Store this mixture at room temperature. Make a new batch daily.   To use the drops:   Put 1 or 2 drops in each side of infant's nose with a clean medicine dropper. Do not use this dropper for any other medicine.   Squeeze the air out of the suction bulb before inserting it into your infant's nose.   While still squeezing the bulb flat, place the tip of the bulb into a nostril. Let air come back into the bulb. The suction will pull snot out of the nose and into the bulb.   Repeat on other nostril.   Squeeze the bulb several times into a tissue and wash the bulb tip in soapy water. Store the bulb with the tip side down on paper towel.   Use the bulb syringe with only the saline drops to avoid irritating your infant's nostrils.  GET HELP RIGHT AWAY IF:  The snot changes to green or yellow.   The snot gets thicker.   Your infant is 3 months or younger with a rectal temperature of 100.4 F (38 C) or higher.   Your infant is older than 3 months with a rectal temperature of 102 F (38.9 C) or higher.   The stuffy nose lasts 10 days or longer.   There is trouble breathing or feeding.  MAKE SURE YOU:  Understand these instructions.   Will watch your  infant's condition.   Will get help right away if your infant is not doing well or gets worse.  Document Released: 06/02/2009 Document Revised: 07/25/2011 Document Reviewed: 06/02/2009 [facility] Patient Information 2012 [place], [place].  Sinusitis Sinuses are air pockets within the bones of your face. The growth of bacteria within a sinus leads to infection. The infection prevents the sinuses from draining. This infection is called sinusitis. SYMPTOMS  There will be different areas of pain depending on which sinuses have become infected.  The maxillary sinuses often produce pain beneath the eyes.   Frontal sinusitis may cause pain in the middle of the forehead and above the eyes.  Other problems (symptoms) include:  Toothaches.   Colored, pus-like (purulent) drainage from the nose.   Swelling, warmth, and tenderness over the sinus areas may be signs of infection.  TREATMENT  Sinusitis is most often determined by an exam.X-rays may be taken. If x-rays have been taken, make sure you obtain your results or find out how you are to obtain them. Your caregiver may give you medications (antibiotics). These are medications that will help kill the bacteria causing the infection. You may also be given a medication (decongestant) that helps to reduce sinus swelling.  HOME CARE INSTRUCTIONS   Only take over-the-counter or prescription medicines for  pain, discomfort, or fever as directed by your caregiver.   Drink extra fluids. Fluids help thin the mucus so your sinuses can drain more easily.   Applying either moist heat or ice packs to the sinus areas may help relieve discomfort.   Use saline nasal sprays to help moisten your sinuses. The sprays can be found at your local drugstore.  SEEK IMMEDIATE MEDICAL CARE IF:  You have a fever.   You have increasing pain, severe headaches, or toothache.   You have nausea, vomiting, or drowsiness.   You develop unusual swelling around the face or  trouble seeing.  MAKE SURE YOU:   Understand these instructions.   Will watch your condition.   Will get help right away if you are not doing well or get worse.  Document Released: 08/05/2005 Document Revised: 07/25/2011 Document Reviewed: 03/04/2007 [facility] Patient Information 2012 [place], [place].     RESOURCE GUIDE  Dental Problems  Patients with Medicaid: [facility] [phone] [address]                                           [phone] [address] Phone:  [phone]                                                  Phone:  [phone]  If unable to pay or uninsured, contact:  [facility] or [facility]. to become qualified for the adult dental clinic.  Chronic Pain Problems Contact [facility]  [phone] Patients need to be referred by their primary care doctor.  Insufficient Money for Medicine Contact [employer]:  call "211" or [facility] 610-568-9539.  No Primary Care Doctor Call [facility]  [phone] Other agencies that provide inexpensive medical care    [facility]  [phone]    [facility]  [phone]    [facility]  [phone]    [facility]  [phone]    [facility]  [phone]    [facility]  [phone]  Psychological Services [facility]  [phone] [facility] Services  [phone] [facility]   [phone] (emergency services [phone])  Substance Abuse Resources [facility]  ([phone] [facility] (463)[phone] [facility] [phone] Floydene Flock (320)[phone] Residential & Outpatient Substance Abuse Program  [phone]  Abuse/Neglect [facility] Child Abuse Hotline (517) 322-6049 [facility] Child Abuse Hotline 503-589-1831 (After Hours)  Emergency Shelter [facility] 857-490-7212  Maternity Homes Room at  the [place] [facility] ([phone] [facility] [phone]  MRSA Hotline #:   (440)361-7805    [facility] Resources  [facility]     [employer]                          [facility] Dept. [address] [place]                       [address]      [address]  [place]                                                Cristobal Goldmann Phone:  [phone]                                   Phone:  [phone]                 Phone:  [phone]  [facility] Phone:  [phone]  [facility] Child Abuse Hotline 367-069-2081 [phone] (After Hours)

## 2011-12-25 NOTE — ED Notes (Signed)
Bed:WA01<BR> Expected date:<BR> Expected time:<BR> Means of arrival:<BR> Comments:<BR> Hold

## 2011-12-25 NOTE — ED Notes (Signed)
Pt reports that he has had nasal congestion and sinus pressure for the last couple of days. Pt states that he has taken OTC allergy medicine without relief. Pt denies fever, cough, or sore throat.

## 2011-12-28 NOTE — ED Provider Notes (Signed)
Medical screening examination/treatment/procedure(s) were performed by non-physician practitioner and as supervising physician I was immediately available for consultation/collaboration.   Ashleyann Shoun E Katharin Schneider, MD 12/28/11 0737 

## 2012-01-03 ENCOUNTER — Emergency Department (HOSPITAL_COMMUNITY)
Admission: EM | Admit: 2012-01-03 | Discharge: 2012-01-03 | Disposition: A | Discharge status: Home / Self Care | Payer: Self-pay | Attending: Emergency Medicine | Admitting: Emergency Medicine

## 2012-01-03 DIAGNOSIS — E119 Type 2 diabetes mellitus without complications: Secondary | ICD-10-CM | POA: Insufficient documentation

## 2012-01-03 DIAGNOSIS — H6090 Unspecified otitis externa, unspecified ear: Secondary | ICD-10-CM

## 2012-01-03 DIAGNOSIS — I10 Essential (primary) hypertension: Secondary | ICD-10-CM | POA: Insufficient documentation

## 2012-01-03 DIAGNOSIS — H9209 Otalgia, unspecified ear: Secondary | ICD-10-CM | POA: Insufficient documentation

## 2012-01-03 DIAGNOSIS — H9190 Unspecified hearing loss, unspecified ear: Secondary | ICD-10-CM | POA: Insufficient documentation

## 2012-01-03 DIAGNOSIS — H60399 Other infective otitis externa, unspecified ear: Secondary | ICD-10-CM | POA: Insufficient documentation

## 2012-01-03 DIAGNOSIS — Z79899 Other long term (current) drug therapy: Secondary | ICD-10-CM | POA: Insufficient documentation

## 2012-01-03 DIAGNOSIS — Z8673 Personal history of transient ischemic attack (TIA), and cerebral infarction without residual deficits: Secondary | ICD-10-CM | POA: Insufficient documentation

## 2012-01-03 DIAGNOSIS — R220 Localized swelling, mass and lump, head: Secondary | ICD-10-CM | POA: Insufficient documentation

## 2012-01-03 MED ORDER — IBUPROFEN 200 MG PO TABS
400.0000 mg | ORAL_TABLET | Freq: Once | ORAL | Status: AC
  Administered 2012-01-03: 400 mg via ORAL
  Filled 2012-01-03: qty 2

## 2012-01-03 MED ORDER — OFLOXACIN 0.3 % OT SOLN: 5.0000 [drp] | Freq: Two times a day (BID) | OTIC | Status: AC

## 2012-01-03 MED ORDER — OXYCODONE-ACETAMINOPHEN 5-325 MG PO TABS
2.0000 | ORAL_TABLET | Freq: Once | ORAL | Status: AC
  Administered 2012-01-03: 2 via ORAL
  Filled 2012-01-03: qty 2

## 2012-01-03 MED ORDER — AMOXICILLIN 500 MG PO CAPS: 1000.0000 mg | ORAL_CAPSULE | Freq: Two times a day (BID) | ORAL | Status: AC

## 2012-01-03 NOTE — Discharge Instructions (Signed)
Draining Ear Fluid (drainage) can come from your ear. This may be wax, yellowish-white fluid (pus), blood, or other fluids. An infection, injury, or irritation may cause fluid to drain from your ear.  HOME CARE  Only take medicine as told by your doctor. This may include ear drops.   Do not rub inside your ear with cotton-tipped swabs.   Do not swim until your doctor says it is okay.   Before you take a shower, cover a cotton ball with petroleum jelly. Put it in your ear. This will keep water out.   Stay away from smoke.   Make sure your shots (vaccinations) are up to date.   Wash your hands well.   Keep all doctor visits as told.  GET HELP RIGHT AWAY IF:   You have very bad ear pain or a headache.   You have a fever.   The patient is older than 3 months with a rectal temperature of 102 F (38.9 C) or higher.   The patient is 83 months old or younger with a rectal temperature of 100.4 F (38 C) or higher.   You throw up (vomit).   You feel dizzy.   You have twitching or shaking (seizure).   You have new hearing loss.   You have more fluid coming from the ear.   You have pain, a fever, or fluid drainage that does not get better within 48 hours of taking medine.   You are more tired than normal.  MAKE SURE YOU:   Understand these instructions.   Will watch your condition.   Will get help right away if you are not doing well or get worse.  Document Released: 01/23/2010 Document Revised: 07/25/2011 Document Reviewed: 01/23/2010 [facility] Patient Information 2012 [place], [place].Otitis Externa Swimmer's ear (otitis externa) is an infection in the outer ear canal. It can be caused by a germ or a fungus. It may be caused by:  Swimming in dirty water.   Water that stays in the ear after swimming.  HOME CARE  Put drops in the ear canal as told by your doctor.   Only take medicine as told by your doctor.  GET HELP RIGHT AWAY IF:   You have a temperature by mouth  above 102 F (38.9 C), not controlled by medicine.   There is ear pain after 3 days.  MAKE SURE YOU:   Understand these instructions.   Will watch this condition.   Will get help right away if you are not doing well or get worse.  Document Released: 01/22/2008 Document Revised: 07/25/2011 Document Reviewed: 01/22/2008 [facility] Patient Information 2012 [place], [place].

## 2012-01-03 NOTE — ED Provider Notes (Signed)
History    [name] with L ear pain. Onset while sleepign yesterday. Constant. Slight decrease hearing. No tinnitus. Some drainage from same ear. No fever or chills. No n/v.  CSN: [id number]  Arrival date & time 01/03/12  1046   First MD Initiated Contact with Patient 01/03/12 1052      Chief Complaint  Patient presents with  . Otalgia    (Consider location/radiation/quality/duration/timing/severity/associated sxs/prior treatment) HPI  Past Medical History  Diagnosis Date  . Stroke   . Diabetes mellitus   . Hypertension     No past surgical history on file.  Family History  Problem Relation Age of Onset  . Cancer Mother   . Hypertension Mother   . Hypertension Father   . Cancer Father   . Diabetes Sister   . Hypertension Sister   . Hypertension Brother   . Diabetes Brother     History  Substance Use Topics  . Smoking status: Never Smoker   . Smokeless tobacco: Not on file  . Alcohol Use: No      Review of Systems   Review of symptoms negative unless otherwise noted in HPI.   Allergies  Review of patient's allergies indicates no known allergies.  Home Medications   Current Outpatient Rx  Name Route Sig Dispense Refill  . CETIRIZINE-PSEUDOEPHEDRINE ER 5-120 MG PO TB12 Oral Take 1 tablet by mouth daily. 30 tablet 0  . FLUTICASONE PROPIONATE 50 MCG/ACT NA SUSP Nasal Place 2 sprays into the nose daily. 16 g 0  . LISINOPRIL 10 MG PO TABS Oral Take 1 tablet (10 mg total) by mouth daily. 14 tablet 0  . METFORMIN HCL 1000 MG PO TABS Oral Take 1,000 mg by mouth 2 (two) times daily with a meal.      . AMOXICILLIN 500 MG PO CAPS Oral Take 2 capsules (1,000 mg total) by mouth 2 (two) times daily. 10 capsule 0  . OFLOXACIN 0.3 % OT SOLN Otic Place 5 drops in ear(s) 2 (two) times daily. 5 mL 0    For 5 days    BP 146/59  Pulse 114  Temp(Src) 97.8 F (36.6 C) (Oral)  Resp 20  SpO2 98%  Physical Exam  Nursing note and vitals reviewed. Constitutional: He appears  well-developed and well-nourished. No distress.  HENT:  Head: Normocephalic and atraumatic.       R ext aud canal and Tm normal. L ext aud canal with purulent material and some mild canal edema. Visualized portion of TM appears intact.  Eyes: Conjunctivae are normal. Pupils are equal, round, and reactive to light. Right eye exhibits no discharge. Left eye exhibits no discharge.  Neck: Neck supple.  Cardiovascular: Normal rate, regular rhythm and normal heart sounds.  Exam reveals no gallop and no friction rub.   No murmur heard. Pulmonary/Chest: Effort normal and breath sounds normal. No respiratory distress.  Musculoskeletal: He exhibits no edema and no tenderness.  Neurological: He is alert.  Skin: Skin is warm and dry.  Psychiatric: He has a normal mood and affect. His behavior is normal. Thought content normal.    ED Course  Procedures (including critical care time)  Labs Reviewed - No data to display No results found.   1. Otitis externa       MDM  [name] with L otitis externa. Abx and outpt fu. Return instructions discussed.       Raeford Razor, MD 01/03/12 [phone]

## 2012-01-03 NOTE — ED Notes (Signed)
Onset  Yesterday, severe left earache. States had pink drainage earlier today. States had a bad sinus infection 3-4 days ago. Was treated with abx.

## 2012-01-05 ENCOUNTER — Encounter (HOSPITAL_COMMUNITY): Payer: Self-pay

## 2012-01-05 ENCOUNTER — Emergency Department (HOSPITAL_COMMUNITY)
Admission: EM | Admit: 2012-01-05 | Discharge: 2012-01-05 | Disposition: A | Discharge status: Home / Self Care | Payer: Self-pay | Attending: Emergency Medicine | Admitting: Emergency Medicine

## 2012-01-05 DIAGNOSIS — R739 Hyperglycemia, unspecified: Secondary | ICD-10-CM

## 2012-01-05 DIAGNOSIS — Z79899 Other long term (current) drug therapy: Secondary | ICD-10-CM | POA: Insufficient documentation

## 2012-01-05 DIAGNOSIS — R42 Dizziness and giddiness: Secondary | ICD-10-CM | POA: Insufficient documentation

## 2012-01-05 DIAGNOSIS — I10 Essential (primary) hypertension: Secondary | ICD-10-CM | POA: Insufficient documentation

## 2012-01-05 DIAGNOSIS — H6690 Otitis media, unspecified, unspecified ear: Secondary | ICD-10-CM | POA: Insufficient documentation

## 2012-01-05 DIAGNOSIS — E1169 Type 2 diabetes mellitus with other specified complication: Secondary | ICD-10-CM | POA: Insufficient documentation

## 2012-01-05 LAB — DIFFERENTIAL
Basophils Absolute: 0 10*3/uL (ref 0.0–0.1)
Basophils Relative: 0 % (ref 0–1)
Eosinophils Relative: 0 % (ref 0–5)
Monocytes Absolute: 0.4 10*3/uL (ref 0.1–1.0)
Neutro Abs: 5.9 10*3/uL (ref 1.7–7.7)

## 2012-01-05 LAB — COMPREHENSIVE METABOLIC PANEL
AST: 14 U/L (ref 0–37)
Albumin: 3.4 g/dL — ABNORMAL LOW (ref 3.5–5.2)
Calcium: 9.4 mg/dL (ref 8.4–10.5)
Chloride: 97 mEq/L (ref 96–112)
Creatinine, Ser: 0.73 mg/dL (ref 0.50–1.35)
Total Protein: 7.7 g/dL (ref 6.0–8.3)

## 2012-01-05 LAB — CBC
HCT: 36.4 % — ABNORMAL LOW (ref 39.0–52.0)
MCHC: 32.7 g/dL (ref 30.0–36.0)
RDW: 13 % (ref 11.5–15.5)

## 2012-01-05 LAB — GLUCOSE, CAPILLARY: Glucose-Capillary: 308 mg/dL — ABNORMAL HIGH (ref 70–99)

## 2012-01-05 MED ORDER — MECLIZINE HCL 25 MG PO TABS: 25.0000 mg | ORAL_TABLET | Freq: Three times a day (TID) | ORAL | Status: AC | PRN

## 2012-01-05 MED ORDER — ONDANSETRON HCL 4 MG/2ML IJ SOLN
4.0000 mg | Freq: Once | INTRAMUSCULAR | Status: AC
  Administered 2012-01-05: 4 mg via INTRAVENOUS
  Filled 2012-01-05: qty 2

## 2012-01-05 MED ORDER — SODIUM CHLORIDE 0.9 % IV SOLN
1000.0000 mL | Freq: Once | INTRAVENOUS | Status: AC
  Administered 2012-01-05: 1000 mL via INTRAVENOUS

## 2012-01-05 MED ORDER — SODIUM CHLORIDE 0.9 % IV SOLN: 1000.0000 mL | INTRAVENOUS | Status: DC

## 2012-01-05 MED ORDER — ONDANSETRON 8 MG PO TBDP: 8.0000 mg | ORAL_TABLET | Freq: Three times a day (TID) | ORAL | Status: AC | PRN

## 2012-01-05 MED ORDER — MECLIZINE HCL 25 MG PO TABS
25.0000 mg | ORAL_TABLET | Freq: Once | ORAL | Status: AC
  Administered 2012-01-05: 25 mg via ORAL
  Filled 2012-01-05: qty 1

## 2012-01-05 MED ORDER — INSULIN REGULAR HUMAN 100 UNIT/ML IJ SOLN: 6.0000 [IU] | Freq: Once | INTRAMUSCULAR | Status: DC

## 2012-01-05 MED ORDER — HYDROCODONE-ACETAMINOPHEN 5-325 MG PO TABS: 1.0000 | ORAL_TABLET | Freq: Four times a day (QID) | ORAL | Status: AC | PRN

## 2012-01-05 MED ORDER — MORPHINE SULFATE 4 MG/ML IJ SOLN
4.0000 mg | Freq: Once | INTRAMUSCULAR | Status: AC
  Administered 2012-01-05: 4 mg via INTRAVENOUS
  Filled 2012-01-05: qty 1

## 2012-01-05 MED ORDER — INSULIN ASPART 100 UNIT/ML ~~LOC~~ SOLN
6.0000 [IU] | Freq: Once | SUBCUTANEOUS | Status: AC
  Administered 2012-01-05: 6 [IU] via SUBCUTANEOUS
  Filled 2012-01-05: qty 1

## 2012-01-05 NOTE — ED Notes (Signed)
Pt reports he was prescribed amoxicillin on 5/17 for a left ear infection and became nauseated after taking these pills. Pt reports he has vomiting 5 times today and currently denies nausea. Pt states he has been unable to keep his medications down today. Pt reports dizziness without vertigo.  Pt also reports generalized weakness and denies pain.

## 2012-01-05 NOTE — ED Provider Notes (Signed)
History     CSN: [id number]  Arrival date & time 01/05/12  1600   First MD Initiated Contact with Patient 01/05/12 1653      Chief Complaint  Patient presents with  . Weakness  . Emesis  . Nausea     HPI Patient presents to the emergency room with complaints of weakness, nausea and vomiting. He saw his doctor in the past week and was diagnosed with an ear infection. He has been taking amoxicillin and Floxin eardrops. Patient states he continues to have drainage from his ear. He did have trouble with nausea vomiting. He did notice that when he moved his head the symptoms became worse. He was not clearly having any trouble with the room spinning or a sensation of movement. Patient denies any fevers, chills, cough. He has not had any focal neurologic deficits. Patient does feel better when he is still. Past Medical History  Diagnosis Date  . Stroke   . Diabetes mellitus   . Hypertension     History reviewed. No pertinent past surgical history.  Family History  Problem Relation Age of Onset  . Cancer Mother   . Hypertension Mother   . Hypertension Father   . Cancer Father   . Diabetes Sister   . Hypertension Sister   . Hypertension Brother   . Diabetes Brother     History  Substance Use Topics  . Smoking status: Never Smoker   . Smokeless tobacco: Not on file  . Alcohol Use: No      Review of Systems  All other systems reviewed and are negative.    Allergies  Review of patient's allergies indicates no known allergies.  Home Medications   Current Outpatient Rx  Name Route Sig Dispense Refill  . AMOXICILLIN 500 MG PO CAPS Oral Take 2 capsules (1,000 mg total) by mouth 2 (two) times daily. 10 capsule 0  . CETIRIZINE-PSEUDOEPHEDRINE ER 5-120 MG PO TB12 Oral Take 1 tablet by mouth daily. 30 tablet 0  . FLUTICASONE PROPIONATE 50 MCG/ACT NA SUSP Nasal Place 2 sprays into the nose daily. 16 g 0  . IBUPROFEN 200 MG PO TABS Oral Take 200 mg by mouth every 6 (six)  hours as needed. Pain    . LISINOPRIL 10 MG PO TABS Oral Take 1 tablet (10 mg total) by mouth daily. 14 tablet 0  . METFORMIN HCL 1000 MG PO TABS Oral Take 1,000 mg by mouth 2 (two) times daily with a meal.      . OFLOXACIN 0.3 % OT SOLN Otic Place 5 drops in ear(s) 2 (two) times daily. 5 mL 0    For 5 days  . HYDROCODONE-ACETAMINOPHEN 5-325 MG PO TABS Oral Take 1-2 tablets by mouth every 6 (six) hours as needed for pain. 16 tablet 0  . MECLIZINE HCL 25 MG PO TABS Oral Take 1 tablet (25 mg total) by mouth 3 (three) times daily as needed. 21 tablet 21  . ONDANSETRON 8 MG PO TBDP Oral Take 1 tablet (8 mg total) by mouth every 8 (eight) hours as needed for nausea. 20 tablet 0    BP 141/66  Pulse 84  Temp(Src) 97.7 F (36.5 C) (Oral)  Resp 16  SpO2 96%  Physical Exam  Nursing note and vitals reviewed. Constitutional: He appears well-developed and well-nourished. No distress.  HENT:  Head: Normocephalic and atraumatic.  Right Ear: Tympanic membrane and external ear normal.  Left Ear: External ear normal. No foreign bodies. No mastoid tenderness. A  middle ear effusion is present.       Fluid within the left external auditory canal, unable to visualize the tympanic membrane or landmarks  Eyes: Conjunctivae are normal. Right eye exhibits no discharge. Left eye exhibits no discharge. No scleral icterus.  Neck: Neck supple. No tracheal deviation present.  Cardiovascular: Normal rate, regular rhythm and intact distal pulses.   Pulmonary/Chest: Effort normal and breath sounds normal. No stridor. No respiratory distress. He has no wheezes. He has no rales.  Abdominal: Soft. Bowel sounds are normal. He exhibits no distension. There is no tenderness. There is no rebound and no guarding.  Musculoskeletal: He exhibits no edema and no tenderness.  Neurological: He is alert. He has normal strength. No sensory deficit. Cranial nerve deficit:  no gross defecits noted. He exhibits normal muscle tone. He  displays no seizure activity. Coordination normal.  Skin: Skin is warm and dry. No rash noted.  Psychiatric: He has a normal mood and affect.    ED Course  Procedures (including critical care time)  Labs Reviewed  GLUCOSE, CAPILLARY - Abnormal; Notable for the following:    Glucose-Capillary 308 (*)    All other components within normal limits  CBC - Abnormal; Notable for the following:    Hemoglobin 11.9 (*)    HCT 36.4 (*)    MCH 25.9 (*)    All other components within normal limits  DIFFERENTIAL - Abnormal; Notable for the following:    Neutrophils Relative 78 (*)    All other components within normal limits  COMPREHENSIVE METABOLIC PANEL - Abnormal; Notable for the following:    Glucose, Bld 360 (*)    Albumin 3.4 (*)    Total Bilirubin 0.2 (*)    All other components within normal limits   No results found.   1. Otitis media   2. Vertigo   3. Hyperglycemia       MDM  Patient's symptoms are suggestive of an ear infection associated with injury or symptoms/vertigo. The patient does not have any mastoid tenderness. There is no swelling or redness to suggest a mastoiditis. Patient does have hyperglycemia. He is already on metformin. I will give him a dose of insulin subcutaneously. He also will be discharged home with prescription for meclizine and antinausea medications.        Celene Kras, MD 01/05/12 [phone]

## 2012-01-05 NOTE — ED Notes (Signed)
Pt in from home with weakness, nausea, and vomiting states onset this am denies pain states he took amoxicillin for an ear infection states that is when sx began

## 2012-06-23 ENCOUNTER — Encounter (HOSPITAL_COMMUNITY): Payer: Self-pay | Admitting: Emergency Medicine

## 2012-06-23 ENCOUNTER — Emergency Department (HOSPITAL_COMMUNITY): Payer: Self-pay

## 2012-06-23 ENCOUNTER — Emergency Department (HOSPITAL_COMMUNITY)
Admission: EM | Admit: 2012-06-23 | Discharge: 2012-06-23 | Disposition: A | Discharge status: Home / Self Care | Payer: Self-pay | Attending: Emergency Medicine | Admitting: Emergency Medicine

## 2012-06-23 DIAGNOSIS — M79609 Pain in unspecified limb: Secondary | ICD-10-CM | POA: Insufficient documentation

## 2012-06-23 DIAGNOSIS — Z79899 Other long term (current) drug therapy: Secondary | ICD-10-CM | POA: Insufficient documentation

## 2012-06-23 DIAGNOSIS — X58XXXA Exposure to other specified factors, initial encounter: Secondary | ICD-10-CM | POA: Insufficient documentation

## 2012-06-23 DIAGNOSIS — I10 Essential (primary) hypertension: Secondary | ICD-10-CM | POA: Insufficient documentation

## 2012-06-23 DIAGNOSIS — I6350 Cerebral infarction due to unspecified occlusion or stenosis of unspecified cerebral artery: Secondary | ICD-10-CM | POA: Insufficient documentation

## 2012-06-23 DIAGNOSIS — S058X9A Other injuries of unspecified eye and orbit, initial encounter: Secondary | ICD-10-CM | POA: Insufficient documentation

## 2012-06-23 DIAGNOSIS — Y939 Activity, unspecified: Secondary | ICD-10-CM | POA: Insufficient documentation

## 2012-06-23 DIAGNOSIS — E119 Type 2 diabetes mellitus without complications: Secondary | ICD-10-CM | POA: Insufficient documentation

## 2012-06-23 DIAGNOSIS — Y929 Unspecified place or not applicable: Secondary | ICD-10-CM | POA: Insufficient documentation

## 2012-06-23 DIAGNOSIS — S0500XA Injury of conjunctiva and corneal abrasion without foreign body, unspecified eye, initial encounter: Secondary | ICD-10-CM

## 2012-06-23 LAB — COMPREHENSIVE METABOLIC PANEL
ALT: 12 U/L (ref 0–53)
Alkaline Phosphatase: 59 U/L (ref 39–117)
Chloride: 97 mEq/L (ref 96–112)
GFR calc Af Amer: >90 mL/min (ref >90)
Glucose, Bld: 334 mg/dL — ABNORMAL HIGH (ref 70–99)
Potassium: 4.7 mEq/L (ref 3.5–5.1)
Sodium: 135 mEq/L (ref 135–145)
Total Bilirubin: 0.2 mg/dL — ABNORMAL LOW (ref 0.3–1.2)
Total Protein: 7.3 g/dL (ref 6.0–8.3)

## 2012-06-23 LAB — CBC WITH DIFFERENTIAL/PLATELET
Eosinophils Absolute: 0 10*3/uL (ref 0.0–0.7)
Hemoglobin: 13.3 g/dL (ref 13.0–17.0)
Lymphocytes Relative: 24 % (ref 12–46)
Lymphs Abs: 1.7 10*3/uL (ref 0.7–4.0)
MCH: 26.2 pg (ref 26.0–34.0)
Neutro Abs: 5.1 10*3/uL (ref 1.7–7.7)
Neutrophils Relative %: 69 % (ref 43–77)
Platelets: 235 10*3/uL (ref 150–400)
RBC: 5.07 MIL/uL (ref 4.22–5.81)
WBC: 7.4 10*3/uL (ref 4.0–10.5)

## 2012-06-23 LAB — URINALYSIS, ROUTINE W REFLEX MICROSCOPIC
Bilirubin Urine: NEGATIVE
Hgb urine dipstick: NEGATIVE
Specific Gravity, Urine: 1.043 — ABNORMAL HIGH (ref 1.005–1.030)
pH: 5 (ref 5.0–8.0)

## 2012-06-23 LAB — URINE MICROSCOPIC-ADD ON

## 2012-06-23 MED ORDER — FLUORESCEIN SODIUM 1 MG OP STRP
1.0000 | ORAL_STRIP | Freq: Once | OPHTHALMIC | Status: AC
  Administered 2012-06-23: 1 via OPHTHALMIC
  Filled 2012-06-23: qty 1

## 2012-06-23 MED ORDER — PROPARACAINE HCL 0.5 % OP SOLN
1.0000 [drp] | Freq: Once | OPHTHALMIC | Status: AC
  Administered 2012-06-23: 1 [drp] via OPHTHALMIC
  Filled 2012-06-23: qty 15

## 2012-06-23 MED ORDER — HYDROMORPHONE HCL PF 1 MG/ML IJ SOLN
1.0000 mg | Freq: Once | INTRAMUSCULAR | Status: AC
  Administered 2012-06-23: 1 mg via INTRAVENOUS
  Filled 2012-06-23: qty 1

## 2012-06-23 MED ORDER — ERYTHROMYCIN 5 MG/GM OP OINT
1.0000 "application " | TOPICAL_OINTMENT | Freq: Four times a day (QID) | OPHTHALMIC | Status: DC
  Administered 2012-06-23: 1 via OPHTHALMIC
  Filled 2012-06-23: qty 3.5

## 2012-06-23 MED ORDER — METFORMIN HCL 1000 MG PO TABS: 1000.0000 mg | ORAL_TABLET | Freq: Two times a day (BID) | ORAL | Status: DC

## 2012-06-23 MED ORDER — LISINOPRIL 10 MG PO TABS: 10.0000 mg | ORAL_TABLET | Freq: Every day | ORAL | Status: DC

## 2012-06-23 MED ORDER — HYDROCODONE-ACETAMINOPHEN 5-325 MG PO TABS: 1.0000 | ORAL_TABLET | Freq: Four times a day (QID) | ORAL | Status: DC | PRN

## 2012-06-23 NOTE — ED Notes (Addendum)
States bright lights bothering eyes since yesterday, also left side of face hurts when he lays on it. States hasn't taken BP meds for a couple days because it causes him to see flashing lights.

## 2012-06-23 NOTE — ED Provider Notes (Signed)
History     CSN: [id number]  Arrival date & time 06/23/12  1501   First MD Initiated Contact with Patient 06/23/12 1610      Chief Complaint  Patient presents with  . Photophobia    (Consider location/radiation/quality/duration/timing/severity/associated sxs/prior treatment) HPI The patient presents with concerns over ongoing high discomfort.  He notes over the past 2 days has had intermittent pain in his left thigh.  The pain is sharp, radiating towards the occiput.  The pain has worsened with light exposure, though present with no clear precipitant, and without any clear exacerbating or alleviating factors.  He denies acuity changes, other headache, confusion, disorientation, unilateral weakness or any facial asymmetry. 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  Past Medical History  Diagnosis Date  . Stroke   . Diabetes mellitus   . Hypertension     History reviewed. No pertinent past surgical history.  Family History  Problem Relation Age of Onset  . Cancer Mother   . Hypertension Mother   . Hypertension Father   . Cancer Father   . Diabetes Sister   . Hypertension Sister   . Hypertension Brother   . Diabetes Brother     History  Substance Use Topics  . Smoking status: Never Smoker   . Smokeless tobacco: Not on file  . Alcohol Use: No      Review of Systems  Constitutional:       Per HPI, otherwise negative  HENT:       Per HPI, otherwise negative  Eyes: Negative.   Respiratory:       Per HPI, otherwise negative  Cardiovascular:       Per HPI, otherwise negative  Gastrointestinal: Negative for vomiting.  Genitourinary: Negative.   Musculoskeletal:       Per HPI, otherwise negative  Skin: Negative.   Neurological: Negative for syncope.    Allergies  Review of patient's allergies indicates no known allergies.  Home Medications   Current Outpatient Rx  Name   Route  Sig  Dispense  Refill  . LISINOPRIL 10 MG PO TABS   Oral   Take 10 mg by mouth daily.         Marland Kitchen METFORMIN HCL 1000 MG PO TABS   Oral   Take 1,000 mg by mouth 2 (two) times daily with a meal.             BP 141/71  Pulse 98  Temp 98.6 F (37 C) (Oral)  Resp 16  SpO2 98%  Physical Exam  Nursing note and vitals reviewed. Constitutional: He is oriented to person, place, and time. He appears well-developed. No distress.  HENT:  Nose: Nose normal.       No ttp about the L temporal area, nor any facial deformities.  Eyes: Conjunctivae normal and EOM are normal.  Slit lamp exam:      The left eye shows corneal abrasion and fluorescein uptake. The left eye shows no corneal flare, no corneal ulcer, no foreign body, no hyphema and no hypopyon.       R eye is arteficial  Cardiovascular: Normal rate and regular rhythm.   Pulmonary/Chest: Effort normal. No stridor. No respiratory distress.  Abdominal: He exhibits no distension.  Musculoskeletal: He exhibits no edema.  Neurological: He is alert and oriented to person, place, and time.  Skin: Skin is warm and dry.  Psychiatric: He has a  normal mood and affect.    ED Course  Procedures (including critical care time)   Labs Reviewed  CBC WITH DIFFERENTIAL  COMPREHENSIVE METABOLIC PANEL  LIPASE, BLOOD  URINALYSIS, ROUTINE W REFLEX MICROSCOPIC  TROPONIN I   No results found.   No diagnosis found.   Date: 06/23/2012  Rate: 83  Rhythm: normal sinus rhythm  QRS Axis: normal  Intervals: normal  ST/T Wave abnormalities: nonspecific T wave changes  Conduction Disutrbances:none  Narrative Interpretation:   Old EKG Reviewed: unchanged  ABNORMAL - no changes from 10/2011  Cardiac: 75sr, normal  O2- 99%ra, normal   MDM  The patient presents with concerns of ongoing left arm pain, headache, as well as hypertension.  On exam the patient is in no distress.  He has no unilateral neurologic deficiencies, nor any facial  asymmetry. There is low suspicion of stroke. He also has no acuity loss of the affected eye, nor any temporal ttp, reassuring for low suspicion of temporal arteritis.  The patient does have a significant corneal abrasion on his left eye.  The remainder of his labs, EKG, chest x-ray are all relatively reassuring.  Although the patient notes he has not been taking his blood pressure medication, there is no evidence of endorgan effects.  Given the patient's prior surgical repair of his left eye cataract, we discussed the need for next a followup with his ophthalmologist.  He was provided analgesics, erythromycin, discharged in stable condition.       Gerhard Munch, MD 06/23/12 [phone]

## 2012-06-23 NOTE — ED Notes (Signed)
Pt verbalizes understanding 

## 2012-09-29 ENCOUNTER — Emergency Department (HOSPITAL_COMMUNITY)
Admission: EM | Admit: 2012-09-29 | Discharge: 2012-09-29 | Disposition: A | Discharge status: Home / Self Care | Payer: Self-pay | Attending: Emergency Medicine | Admitting: Emergency Medicine

## 2012-09-29 ENCOUNTER — Encounter (HOSPITAL_COMMUNITY): Payer: Self-pay | Admitting: Emergency Medicine

## 2012-09-29 ENCOUNTER — Emergency Department (HOSPITAL_COMMUNITY): Payer: Self-pay

## 2012-09-29 DIAGNOSIS — Z79899 Other long term (current) drug therapy: Secondary | ICD-10-CM | POA: Insufficient documentation

## 2012-09-29 DIAGNOSIS — I10 Essential (primary) hypertension: Secondary | ICD-10-CM | POA: Insufficient documentation

## 2012-09-29 DIAGNOSIS — R1013 Epigastric pain: Secondary | ICD-10-CM | POA: Insufficient documentation

## 2012-09-29 DIAGNOSIS — R739 Hyperglycemia, unspecified: Secondary | ICD-10-CM

## 2012-09-29 DIAGNOSIS — Z8673 Personal history of transient ischemic attack (TIA), and cerebral infarction without residual deficits: Secondary | ICD-10-CM | POA: Insufficient documentation

## 2012-09-29 DIAGNOSIS — R110 Nausea: Secondary | ICD-10-CM | POA: Insufficient documentation

## 2012-09-29 DIAGNOSIS — Z91199 Patient's noncompliance with other medical treatment and regimen due to unspecified reason: Secondary | ICD-10-CM | POA: Insufficient documentation

## 2012-09-29 DIAGNOSIS — IMO0001 Reserved for inherently not codable concepts without codable children: Secondary | ICD-10-CM | POA: Insufficient documentation

## 2012-09-29 DIAGNOSIS — R109 Unspecified abdominal pain: Secondary | ICD-10-CM

## 2012-09-29 DIAGNOSIS — Z9119 Patient's noncompliance with other medical treatment and regimen: Secondary | ICD-10-CM | POA: Insufficient documentation

## 2012-09-29 DIAGNOSIS — R229 Localized swelling, mass and lump, unspecified: Secondary | ICD-10-CM | POA: Insufficient documentation

## 2012-09-29 LAB — URINE MICROSCOPIC-ADD ON

## 2012-09-29 LAB — GLUCOSE, CAPILLARY
Glucose-Capillary: 349 mg/dL — ABNORMAL HIGH (ref 70–99)
Glucose-Capillary: 270 mg/dL — ABNORMAL HIGH (ref 70–99)

## 2012-09-29 LAB — URINALYSIS, ROUTINE W REFLEX MICROSCOPIC
Glucose, UA: >1000 mg/dL — AB
Hgb urine dipstick: NEGATIVE
Ketones, ur: NEGATIVE mg/dL
Protein, ur: NEGATIVE mg/dL

## 2012-09-29 LAB — COMPREHENSIVE METABOLIC PANEL
ALT: 18 U/L (ref 0–53)
AST: 14 U/L (ref 0–37)
Alkaline Phosphatase: 60 U/L (ref 39–117)
CO2: 26 mEq/L (ref 19–32)
Chloride: 95 mEq/L — ABNORMAL LOW (ref 96–112)
GFR calc non Af Amer: >90 mL/min (ref >90)
Sodium: 132 mEq/L — ABNORMAL LOW (ref 135–145)
Total Bilirubin: 0.2 mg/dL — ABNORMAL LOW (ref 0.3–1.2)

## 2012-09-29 LAB — TYPE AND SCREEN: ABO/RH(D): A POS

## 2012-09-29 LAB — ABO/RH: ABO/RH(D): A POS

## 2012-09-29 MED ORDER — DOCUSATE SODIUM 100 MG PO CAPS: 100.0000 mg | ORAL_CAPSULE | Freq: Two times a day (BID) | ORAL | Status: DC

## 2012-09-29 MED ORDER — IOHEXOL 300 MG/ML  SOLN
100.0000 mL | Freq: Once | INTRAMUSCULAR | Status: AC | PRN
  Administered 2012-09-29: 100 mL via INTRAVENOUS

## 2012-09-29 MED ORDER — SODIUM CHLORIDE 0.9 % IV SOLN
INTRAVENOUS | Status: DC
  Administered 2012-09-29: 2.9 [IU]/h via INTRAVENOUS
  Filled 2012-09-29: qty 1

## 2012-09-29 MED ORDER — POLYETHYLENE GLYCOL 3350 17 GM/SCOOP PO POWD: 17.0000 g | Freq: Every day | ORAL | Status: DC

## 2012-09-29 MED ORDER — HYDROCODONE-ACETAMINOPHEN 5-325 MG PO TABS: 1.0000 | ORAL_TABLET | ORAL | Status: DC | PRN

## 2012-09-29 MED ORDER — IOHEXOL 300 MG/ML  SOLN
50.0000 mL | Freq: Once | INTRAMUSCULAR | Status: AC | PRN
  Administered 2012-09-29: 50 mL via ORAL

## 2012-09-29 MED ORDER — PROMETHAZINE HCL 25 MG PO TABS: 25.0000 mg | ORAL_TABLET | Freq: Four times a day (QID) | ORAL | Status: DC | PRN

## 2012-09-29 MED ORDER — SODIUM CHLORIDE 0.9 % IV SOLN: 1000.0000 mL | INTRAVENOUS | Status: DC

## 2012-09-29 MED ORDER — METFORMIN HCL 1000 MG PO TABS: 1000.0000 mg | ORAL_TABLET | Freq: Two times a day (BID) | ORAL | Status: DC

## 2012-09-29 MED ORDER — SODIUM CHLORIDE 0.9 % IV SOLN
1000.0000 mL | Freq: Once | INTRAVENOUS | Status: AC
  Administered 2012-09-29: 1000 mL via INTRAVENOUS

## 2012-09-29 NOTE — ED Provider Notes (Signed)
Medical screening examination/treatment/procedure(s) were conducted as a shared visit with non-physician practitioner(s) and myself.  I personally evaluated the patient during the encounter  9:56 PM The patient is tolerating fluids at this time.  He has had nausea and upper abdominal pain for 2 days.  He has a mild elevation in his lipase.  This is likely mild but now resolving pancreatitis.  The patient has been tolerating fluids in the emergency department.  He was able to eat some food today.  CT scans without significant abnormality.  Discharge home in good condition.  Home with pain medicine and nausea medicine.  0.4 hour clear liquid diet.  He was hyperglycemic without DKA on arrival.  After IV fluids his blood sugars come down.  His hyperglycemia secondary to noncompliance with his medications because he had run out of his metformin.  The patient was written a prescription for metformin  Lyanne Co, MD 09/29/12 2156

## 2012-09-29 NOTE — ED Provider Notes (Signed)
History     CSN: [id number]  Arrival date & time 09/29/12  1554   First MD Initiated Contact with Patient 09/29/12 1652      Chief Complaint  Patient presents with  . Abdominal Pain    Upper abdomen pain and tenderness    (Consider location/radiation/quality/duration/timing/severity/associated sxs/prior treatment) HPI 67 year old male presents emergency department with chief complaint of nausea and abdominal pain.  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  His girlfriend urged him to come in today after he has had nausea and abdominal pain over the past 2 days.  He states that he was doubled over in pain earlier today, but that his pain resolved and he was able to eat a full meal at a local [employer] later.  He denies any nausea or pain at this time.   He denies any fevers, chills, soaking night sweats, unexplained weight loss.  He has a distant history of alcohol abuse but has not had any alcohol, tobacco, illicit drug use over the past 30 years.  Patient denies decrease in appetite, fatigue.  He complains of some abdominal distention.  Next urinary symptoms.  Patient denies vomiting, melena hematochezia or diarrhea. Past Medical History  Diagnosis Date  . Stroke   . Diabetes mellitus   . Hypertension     Past Surgical History  Procedure Laterality Date  . Eye surgery      Family History  Problem Relation Age of Onset  . Cancer Mother   . Hypertension Mother   . Hypertension Father   . Cancer Father   . Diabetes Sister   . Hypertension Sister   . Hypertension Brother   . Diabetes Brother     History  Substance Use Topics  . Smoking status: Never Smoker   . Smokeless tobacco: Not on file  . Alcohol Use: No      Review of Systems  Ten systems reviewed and are negative for acute change, except as noted in the HPI.   Allergies  Review of patient's allergies indicates no known allergies.  Home Medications   Current Outpatient Rx  Name  Route  Sig   Dispense  Refill  . metFORMIN (GLUCOPHAGE) 1000 MG tablet   Oral   Take 1 tablet (1,000 mg total) by mouth 2 (two) times daily with a meal.   60 tablet   0     BP 152/89  Pulse 99  Temp(Src) 97.9 F (36.6 C) (Oral)  Resp 17  SpO2 99%  Physical Exam  Physical Exam  Nursing note and vitals reviewed. Constitutional: He appears well-developed and well-nourished. No distress.  HENT:  Head: Normocephalic and atraumatic.  Eyes: Conjunctivae normal are normal. No scleral icterus.  Neck: Normal range of motion. Neck supple.  Cardiovascular: Normal rate, regular rhythm and normal heart sounds.   Pulmonary/Chest: Effort normal and breath sounds normal. No respiratory distress.  Abdominal: Palpable mass just inferior to the xiphoid process.  Tympanic distention.  Greater in bilateral upper quadrants.  Fluid wave negative.  Exquisite tendeness to palpation in the epigastrium.  No peritoneal signs.   Musculoskeletal: He exhibits no edema.  Neurological: He is alert.  Skin: Skin is warm and dry. He is not diaphoretic.  Psychiatric: His behavior is normal.    ED Course  Procedures (including critical care time)  Labs Reviewed  COMPREHENSIVE METABOLIC PANEL - Abnormal; Notable for the following:    Sodium 132 (*)    Chloride 95 (*)    Glucose,  Bld 594 (*)    Albumin 3.3 (*)    Total Bilirubin 0.2 (*)    All other components within normal limits  LIPASE, BLOOD - Abnormal; Notable for the following:    Lipase 164 (*)    All other components within normal limits  URINALYSIS, ROUTINE W REFLEX MICROSCOPIC - Abnormal; Notable for the following:    Specific Gravity, Urine 1.036 (*)    Glucose, UA >1000 (*)    All other components within normal limits  GLUCOSE, CAPILLARY - Abnormal; Notable for the following:    Glucose-Capillary 365 (*)    All other components within normal limits  GLUCOSE, CAPILLARY - Abnormal; Notable for the following:    Glucose-Capillary 349 (*)    All other  components within normal limits  GLUCOSE, CAPILLARY - Abnormal; Notable for the following:    Glucose-Capillary 270 (*)    All other components within normal limits  URINE MICROSCOPIC-ADD ON  TYPE AND SCREEN  ABO/RH   No results found.  CT Abdomen Pelvis W Contrast (Final result)  Result time: 09/29/12 19:41:53    Final result by Rad Results In Interface (09/29/12 19:41:53)    Narrative:   *RADIOLOGY REPORT*  Clinical Data: Upper abdominal pain. Sub-xyphoid swelling and tenderness. Intermittent nausea.  CT ABDOMEN AND PELVIS WITH CONTRAST  Technique: Multidetector CT imaging of the abdomen and pelvis was performed following the standard protocol during bolus administration of intravenous contrast.  Contrast: OMNIPAQUE IOHEXOL 300 MG/ML SOLN, 50mL OMNIPAQUE IOHEXOL 300 MG/ML SOLN  Comparison: None.  Findings: The liver, spleen, and adrenal glands appear normal. Slightly prominent dorsal pancreatic duct noted with suspicion for pancreas divisum. No peripancreatic stranding observed. Gallbladder mildly contracted. Mildly elongated xyphoid process noted without significant inflammation or abnormality in the vicinity of the xyphoid or just below the xyphoid.  No pathologic retroperitoneal or porta hepatis adenopathy is identified.  Appendix normal. No dilated small bowel noted. Prominence of stool throughout the colon suggests constipation. No pathologic pelvic adenopathy is identified.  Moderately distended urinary bladder noted. Prostate gland enlarged.  There is degenerative facet arthropathy bilaterally at L5-S1 causing considerable bilateral foraminal stenosis. There is also a central disc protrusion at the L5-S1 level. Mildly prominent but symmetric seminal vesicles noted.  The kidneys appear unremarkable, as do the proximal ureters.  No ureteral abnormality observed.  IMPRESSION:  1. No specific abnormality observed in the  xyphoid/subxyphoid region. 2. 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 3. Prominence of stool throughout the colon suggests constipation. 4. Prominent prostate gland. Moderately distended urinary bladder may be incidental. 5. Facet arthropathy causes bilateral foraminal stenosis at L5-S1, and there is also a central disc protrusion at this level.   Original Report Authenticated By: Gaylyn Rong, M.D.      1. Abdominal pain   2. Nausea   3. Hyperglycemia       MDM  6:06 PM BP 152/89  Pulse 99  Temp(Src) 97.9 F (36.6 C) (Oral)  Resp 17  SpO2 99% Patient with abdominal distention and mass.  He has no complaints of nausea or vomiting at this time.  We'll obtain labs and CT scan for further evaluation.      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  Patient has hyperglycemia and elevated lipase.  Begun on fluids and insulin drip.  No DKA. Patient care turned over to Dr. Patria Mane and PA Berkley Harvey who are aware of  the patient and have agreed to assume care of the patient.  The patient has no c/o pain or nausea at this time.  He is tolerating PO fluids.  Patient admits to noncompliance with his metformin as he was out of hi medications for the past week.  Arthor Captain, PA-C 10/02/12 [phone]

## 2012-09-29 NOTE — ED Notes (Signed)
Patient transported to CT 

## 2012-09-29 NOTE — ED Notes (Signed)
2 day hx of upper abdominal pain. Swollen, tender, raised area noted below xyphoid. C/o intermittent nausea

## 2012-10-02 NOTE — ED Provider Notes (Signed)
Medical screening examination/treatment/procedure(s) were conducted as a shared visit with non-physician practitioner(s) and myself.  I personally evaluated the patient during the encounter  Please see my other note for full details  Lyanne Co, MD 10/02/12 2342

## 2013-01-26 ENCOUNTER — Emergency Department (HOSPITAL_COMMUNITY)
Admission: EM | Admit: 2013-01-26 | Discharge: 2013-01-26 | Disposition: A | Discharge status: Home / Self Care | Payer: Self-pay | Attending: Emergency Medicine | Admitting: Emergency Medicine

## 2013-01-26 ENCOUNTER — Encounter (HOSPITAL_COMMUNITY): Payer: Self-pay

## 2013-01-26 DIAGNOSIS — Z8673 Personal history of transient ischemic attack (TIA), and cerebral infarction without residual deficits: Secondary | ICD-10-CM | POA: Insufficient documentation

## 2013-01-26 DIAGNOSIS — I10 Essential (primary) hypertension: Secondary | ICD-10-CM | POA: Insufficient documentation

## 2013-01-26 DIAGNOSIS — Z87828 Personal history of other (healed) physical injury and trauma: Secondary | ICD-10-CM | POA: Insufficient documentation

## 2013-01-26 DIAGNOSIS — E119 Type 2 diabetes mellitus without complications: Secondary | ICD-10-CM | POA: Insufficient documentation

## 2013-01-26 DIAGNOSIS — M25476 Effusion, unspecified foot: Secondary | ICD-10-CM | POA: Insufficient documentation

## 2013-01-26 DIAGNOSIS — M25473 Effusion, unspecified ankle: Secondary | ICD-10-CM | POA: Insufficient documentation

## 2013-01-26 DIAGNOSIS — M25472 Effusion, left ankle: Secondary | ICD-10-CM

## 2013-01-26 MED ORDER — NAPROXEN 500 MG PO TABS: 500.0000 mg | ORAL_TABLET | Freq: Two times a day (BID) | ORAL | Status: DC

## 2013-01-26 NOTE — ED Provider Notes (Signed)
History     CSN: [id number]  Arrival date & time 01/26/13  0218   First MD Initiated Contact with Patient 01/26/13 0253      Chief Complaint  Patient presents with  . Leg Pain   HPI  History provided by the patient. Patient is a 67 year old male with history of hypertension, diabetes, CVA and left ankle fracture who presents with complaints of left ankle and foot swelling. Patient first began to notice some swelling 2 days ago while sitting in church. He also reported having an occasional sharp pains at the base of his great toe. Pain was not constant and only occurred occasionally while walking. He denied having any injury or trauma to the foot or ankle. He denies having similar symptoms previously after having an old injury several years ago fracturing his ankle. He denies any swelling up into the proximal ankle or calf area. Denies any skin changes. Denies any chest pain or shortness of breath. He has used some elevation and ice at home which seems to help with swelling some. No other aggravating or alleviating factors. No other associated symptoms.    Past Medical History  Diagnosis Date  . Stroke   . Diabetes mellitus   . Hypertension     Past Surgical History  Procedure Laterality Date  . Eye surgery      Family History  Problem Relation Age of Onset  . Cancer Mother   . Hypertension Mother   . Hypertension Father   . Cancer Father   . Diabetes Sister   . Hypertension Sister   . Hypertension Brother   . Diabetes Brother     History  Substance Use Topics  . Smoking status: Never Smoker   . Smokeless tobacco: Not on file  . Alcohol Use: No      Review of Systems  Respiratory: Negative for shortness of breath.   Cardiovascular: Positive for leg swelling. Negative for chest pain.  Skin: Negative for rash.  Neurological: Negative for weakness and numbness.  All other systems reviewed and are negative.    Allergies  Review of patient's allergies indicates  no known allergies.  Home Medications   Current Outpatient Rx  Name  Route  Sig  Dispense  Refill  . HYDROcodone-acetaminophen (NORCO/VICODIN) 5-325 MG per tablet   Oral   Take 1 tablet by mouth every 4 (four) hours as needed for pain.   15 tablet   0   . metFORMIN (GLUCOPHAGE) 1000 MG tablet   Oral   Take 1 tablet (1,000 mg total) by mouth 2 (two) times daily with a meal.   60 tablet   0   . promethazine (PHENERGAN) 25 MG tablet   Oral   Take 1 tablet (25 mg total) by mouth every 6 (six) hours as needed for nausea.   30 tablet   0     BP 160/77  Pulse 91  Temp(Src) 98.5 F (36.9 C) (Oral)  Resp 18  Ht 5\' 7"  (1.702 m)  Wt 165 lb (74.844 kg)  BMI 25.84 kg/m2  SpO2 98%  Physical Exam  Nursing note and vitals reviewed. Constitutional: He is oriented to person, place, and time. He appears well-developed and well-nourished. No distress.  HENT:  Head: Normocephalic.  Cardiovascular: Normal rate and regular rhythm.   No murmur heard. Pulmonary/Chest: Effort normal and breath sounds normal. No respiratory distress. He has no wheezes. He has no rales.  Musculoskeletal: Normal range of motion. He exhibits edema. He exhibits no  tenderness.  There is mild swelling around the left ankle and dorsal foot. There is no tenderness to palpation or gross deformity. Normal dorsal pedal pulses. Normal sensations and cap refill in toes.  No pain or swelling up the proximal ankle or calf area.  Neurological: He is alert and oriented to person, place, and time.  Skin: Skin is warm. No rash noted. No erythema.  Psychiatric: He has a normal mood and affect. His behavior is normal.    ED Course  Procedures        1. Ankle swelling, left       MDM  3:20 AM patient seen and evaluated. Patient resting comfortably does not complain of any pain at this time. Appears well in no acute distress.  Pt with no injury or trauma.  No significant pains.  Appearance is very low suspicion  for DVT at this time.  Will order US doppler to completely rule this out.  Otherwise will have pt continue RICE treatments.  Pt agrees with this plan.      Angus Seller, PA-C 01/26/13 [phone]

## 2013-01-26 NOTE — ED Notes (Signed)
Pt states he developed swelling to his LLE on Sunday while he was sitting at [employer].  Pt states his leg still feels tight.  States he rested it yesterday after working and still noted that he had some swelling.

## 2013-01-29 NOTE — ED Provider Notes (Signed)
Medical screening examination/treatment/procedure(s) were performed by non-physician practitioner and as supervising physician I was immediately available for consultation/collaboration.   Tayvon Culley E Toree Edling, MD 01/29/13 2000 

## 2013-03-08 ENCOUNTER — Encounter (HOSPITAL_COMMUNITY): Payer: Self-pay | Admitting: *Deleted

## 2013-03-08 ENCOUNTER — Emergency Department (HOSPITAL_COMMUNITY)
Admission: EM | Admit: 2013-03-08 | Discharge: 2013-03-09 | Disposition: A | Discharge status: Home / Self Care | Payer: Self-pay | Attending: Emergency Medicine | Admitting: Emergency Medicine

## 2013-03-08 DIAGNOSIS — R739 Hyperglycemia, unspecified: Secondary | ICD-10-CM

## 2013-03-08 DIAGNOSIS — Z9114 Patient's other noncompliance with medication regimen: Secondary | ICD-10-CM

## 2013-03-08 DIAGNOSIS — Z8673 Personal history of transient ischemic attack (TIA), and cerebral infarction without residual deficits: Secondary | ICD-10-CM | POA: Insufficient documentation

## 2013-03-08 DIAGNOSIS — E1169 Type 2 diabetes mellitus with other specified complication: Secondary | ICD-10-CM | POA: Insufficient documentation

## 2013-03-08 DIAGNOSIS — Z91199 Patient's noncompliance with other medical treatment and regimen due to unspecified reason: Secondary | ICD-10-CM | POA: Insufficient documentation

## 2013-03-08 DIAGNOSIS — R42 Dizziness and giddiness: Secondary | ICD-10-CM | POA: Insufficient documentation

## 2013-03-08 DIAGNOSIS — M542 Cervicalgia: Secondary | ICD-10-CM | POA: Insufficient documentation

## 2013-03-08 DIAGNOSIS — Z9119 Patient's noncompliance with other medical treatment and regimen: Secondary | ICD-10-CM | POA: Insufficient documentation

## 2013-03-08 DIAGNOSIS — I10 Essential (primary) hypertension: Secondary | ICD-10-CM | POA: Insufficient documentation

## 2013-03-08 NOTE — ED Notes (Signed)
States he has been feeling lightheaded since Saturday- denies pain-

## 2013-03-09 ENCOUNTER — Encounter (HOSPITAL_COMMUNITY): Payer: Self-pay

## 2013-03-09 LAB — CBC WITH DIFFERENTIAL/PLATELET
Eosinophils Absolute: 0 10*3/uL (ref 0.0–0.7)
Hemoglobin: 13.1 g/dL (ref 13.0–17.0)
Lymphs Abs: 2 10*3/uL (ref 0.7–4.0)
MCH: 26.3 pg (ref 26.0–34.0)
Monocytes Relative: 5 % (ref 3–12)
Neutro Abs: 3.3 10*3/uL (ref 1.7–7.7)
Neutrophils Relative %: 59 % (ref 43–77)
Platelets: 232 10*3/uL (ref 150–400)
RBC: 4.98 MIL/uL (ref 4.22–5.81)
WBC: 5.6 10*3/uL (ref 4.0–10.5)

## 2013-03-09 LAB — COMPREHENSIVE METABOLIC PANEL
ALT: 16 U/L (ref 0–53)
BUN: 20 mg/dL (ref 6–23)
CO2: 26 mEq/L (ref 19–32)
Calcium: 9.6 mg/dL (ref 8.4–10.5)
Creatinine, Ser: 0.78 mg/dL (ref 0.50–1.35)
GFR calc Af Amer: >90 mL/min (ref >90)
GFR calc non Af Amer: >90 mL/min (ref >90)
Glucose, Bld: 430 mg/dL — ABNORMAL HIGH (ref 70–99)
Sodium: 132 mEq/L — ABNORMAL LOW (ref 135–145)
Total Protein: 6.4 g/dL (ref 6.0–8.3)

## 2013-03-09 LAB — URINALYSIS, ROUTINE W REFLEX MICROSCOPIC
Bilirubin Urine: NEGATIVE
Leukocytes, UA: NEGATIVE
Nitrite: NEGATIVE
Specific Gravity, Urine: 1.037 — ABNORMAL HIGH (ref 1.005–1.030)
Urobilinogen, UA: 0.2 mg/dL (ref 0.0–1.0)
pH: 5.5 (ref 5.0–8.0)

## 2013-03-09 LAB — URINE MICROSCOPIC-ADD ON: Urine-Other: NONE SEEN

## 2013-03-09 LAB — GLUCOSE, CAPILLARY: Glucose-Capillary: 232 mg/dL — ABNORMAL HIGH (ref 70–99)

## 2013-03-09 MED ORDER — SODIUM CHLORIDE 0.9 % IV BOLUS (SEPSIS)
1000.0000 mL | Freq: Once | INTRAVENOUS | Status: AC
  Administered 2013-03-09: 1000 mL via INTRAVENOUS

## 2013-03-09 MED ORDER — LISINOPRIL 20 MG PO TABS: 10.0000 mg | ORAL_TABLET | Freq: Every day | ORAL | Status: DC

## 2013-03-09 MED ORDER — HYDROCHLOROTHIAZIDE 25 MG PO TABS: 25.0000 mg | ORAL_TABLET | Freq: Every day | ORAL | Status: DC

## 2013-03-09 MED ORDER — METFORMIN HCL 1000 MG PO TABS: 500.0000 mg | ORAL_TABLET | Freq: Two times a day (BID) | ORAL | Status: DC

## 2013-03-09 MED ORDER — ONDANSETRON HCL 4 MG/2ML IJ SOLN
4.0000 mg | Freq: Once | INTRAMUSCULAR | Status: AC
  Administered 2013-03-09: 4 mg via INTRAVENOUS
  Filled 2013-03-09: qty 2

## 2013-03-09 MED ORDER — MECLIZINE HCL 25 MG PO TABS: 25.0000 mg | ORAL_TABLET | Freq: Three times a day (TID) | ORAL | Status: DC | PRN

## 2013-03-09 MED ORDER — INSULIN ASPART 100 UNIT/ML ~~LOC~~ SOLN
10.0000 [IU] | Freq: Once | SUBCUTANEOUS | Status: AC
  Administered 2013-03-09: 10 [IU] via INTRAVENOUS
  Filled 2013-03-09: qty 1

## 2013-03-09 MED ORDER — ARTIFICIAL TEARS OP OINT
TOPICAL_OINTMENT | Freq: Once | OPHTHALMIC | Status: AC
  Administered 2013-03-09: 03:00:00 via OPHTHALMIC
  Filled 2013-03-09: qty 3.5

## 2013-03-09 NOTE — ED Provider Notes (Signed)
History    CSN: [id number] Arrival date & time 03/08/13  2314  First MD Initiated Contact with Patient 03/08/13 2356     Chief Complaint  Patient presents with  . Dizziness   HPI Ricky Terry is a 67 y.o. male presenting with "dizziness."  He says he's had this years ago, nothing recently, he notices this first when he wakes up in the morning and changes position, as is sensation of the world spinning, and this goes away over time, it usually disappears over the course of the day. He's had no syncope, the sensation that he might pass out. Patient is a diabetic, he is status post small CVA, he also has hypertension, is noncompliant with his medications. He says he can't take insulin because it makes him "to vomit like a woman who is pregnant" and has not taken any blood pressure medicine the last 4 months because he cannot afford it. Patient is taking metformin. He denies any coughing, chest pain, fever, chills, dysuria, abdominal pain.  He does describe right-sided cervical paraspinous muscle tenderness and tightness it is worse when he turns his head. No headaches. He says he thinks this is due to driving long distances on Saturday-he drove to [place]. Patient would like to get some of his medications refilled including his blood pressure medicines and his metformin.  Past Medical History  Diagnosis Date  . Stroke   . Diabetes mellitus   . Hypertension    Past Surgical History  Procedure Laterality Date  . Eye surgery     Family History  Problem Relation Age of Onset  . Cancer Mother   . Hypertension Mother   . Hypertension Father   . Cancer Father   . Diabetes Sister   . Hypertension Sister   . Hypertension Brother   . Diabetes Brother    History  Substance Use Topics  . Smoking status: Never Smoker   . Smokeless tobacco: Not on file  . Alcohol Use: No    Review of Systems At least 10pt or greater review of systems completed and are negative except where specified in  the HPI.  Allergies  Review of patient's allergies indicates no known allergies.  Home Medications   Current Outpatient Rx  Name  Route  Sig  Dispense  Refill  . naproxen (NAPROSYN) 500 MG tablet   Oral   Take 1 tablet (500 mg total) by mouth 2 (two) times daily.   30 tablet   0    BP 155/77  Pulse 88  Temp(Src) 97.9 F (36.6 C) (Oral)  Resp 20  SpO2 97% Physical Exam  PHYSICAL EXAM: VITAL SIGNS:  . Filed Vitals:   03/08/13 2317  BP: 155/77  Pulse: 88  Temp: 97.9 F (36.6 C)  TempSrc: Oral  Resp: 20  SpO2: 97%   CONSTITUTIONAL: Awake, oriented, appears non-toxic HENT: Atraumatic, normocephalic, oral mucosa pink and moist, airway patent. Nares patent without drainage. External ears normal. 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  NECK: Trachea midline, non-tender, supple CARDIOVASCULAR: Normal heart rate, Normal rhythm, No murmurs, rubs, gallops PULMONARY/CHEST: Clear to auscultation, no rhonchi, wheezes, or rales. Symmetrical breath sounds. CHEST WALL: No lesions. Non-tender. ABDOMINAL: Non-distended, soft, non-tender - no rebound or guarding.  BS normal. NEUROLOGIC: ZH:YQMVHQ fields intact. PERRLA, EOMI.  Facial sensation equal to light touch bilaterally.  Good muscle bulk in the masseter muscle and good lateral movement of the jaw.  Facial expressions equal and good strength with  smile/frown and puffed cheeks.  Hearing grossly intact to finger rub test.  Uvula, tongue are midline with no deviation. Symmetrical palate elevation.  Trapezius and SCM muscles are 5/5 strength bilaterally.   DTR: Brachioradialis, biceps, patellar, Achilles tendon reflexes 2+ bilaterally.  No clonus. Strength: 5/5 strength flexors and extensors in the upper and lower extremities.  Grip strength, finger adduction/abduction 5/5. Sensation: Sensation intact distally to light touch, proprioception using position testing of 2nd digit and great toe EXTREMITIES: No clubbing,  cyanosis, or edema SKIN: Warm, Dry, No erythema, No rash   ED Course  Procedures (including critical care time) Labs Reviewed  GLUCOSE, CAPILLARY - Abnormal; Notable for the following:    Glucose-Capillary 367 (*)    All other components within normal limits  COMPREHENSIVE METABOLIC PANEL - Abnormal; Notable for the following:    Sodium 132 (*)    Glucose, Bld 430 (*)    Albumin 2.9 (*)    Total Bilirubin 0.1 (*)    All other components within normal limits  URINALYSIS, ROUTINE W REFLEX MICROSCOPIC - Abnormal; Notable for the following:    Specific Gravity, Urine 1.037 (*)    Glucose, UA >1000 (*)    All other components within normal limits  GLUCOSE, CAPILLARY - Abnormal; Notable for the following:    Glucose-Capillary 232 (*)    All other components within normal limits  CBC WITH DIFFERENTIAL  URINE MICROSCOPIC-ADD ON   No results found. 1. Vertigo   2. Hyperglycemia without ketosis   3. H/O medication noncompliance     MDM  Ricky Terry is a 67 y.o. male history of medication noncompliance and hyperglycemia presents with dizziness. In reviewing the patient's record, he also has a history of vertigo and was prescribed meclizine in the past. Patient currently not taking his antihypertensives, blood pressures actually not that bad, 155/77, glucose is elevated however. Neurologic exam is unremarkable, do not think this patient has hypertensive emergency secondary to elevated blood pressure again as his blood pressures are 155/77. Patient likely has some dehydration secondary to hyperglycemia. 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  Patient has hyperglycemia, he is not ketotic, not acidotic. Patient feeling much better after some IV hydration as well as insulin.  Patient says he typically  gets nauseous when using insulin, I suggest that perhaps he can use an antinausea medicine angle back to taking insulin as it is a much better control her of his blood sugar. I have refilled his prescriptions for antihypertensives and given him precautions with hydrochlorothiazide specifically for hypokalemia, and lisinopril specifically for angioedema. He can return to the emergency department for any adverse reactions. He is to followup with his primary care physician Dr. Renae Gloss in one to 2 weeks for recheck of his BMP to check potassium and glucose.  Patient understands accepts medical plan as it's been dictated, answered his questions to his satisfaction.  Jones Skene, MD 03/09/13 [phone]

## 2013-04-23 ENCOUNTER — Encounter (HOSPITAL_COMMUNITY): Payer: Self-pay | Admitting: Emergency Medicine

## 2013-04-23 ENCOUNTER — Observation Stay (HOSPITAL_COMMUNITY)
Admission: EM | Admit: 2013-04-23 | Discharge: 2013-04-25 | Disposition: A | Discharge status: Home / Self Care | Payer: Medicare Other | Attending: Internal Medicine | Admitting: Internal Medicine

## 2013-04-23 DIAGNOSIS — I10 Essential (primary) hypertension: Secondary | ICD-10-CM | POA: Insufficient documentation

## 2013-04-23 DIAGNOSIS — E785 Hyperlipidemia, unspecified: Secondary | ICD-10-CM | POA: Insufficient documentation

## 2013-04-23 DIAGNOSIS — R29898 Other symptoms and signs involving the musculoskeletal system: Secondary | ICD-10-CM | POA: Insufficient documentation

## 2013-04-23 DIAGNOSIS — G459 Transient cerebral ischemic attack, unspecified: Principal | ICD-10-CM | POA: Insufficient documentation

## 2013-04-23 DIAGNOSIS — E119 Type 2 diabetes mellitus without complications: Secondary | ICD-10-CM | POA: Insufficient documentation

## 2013-04-23 DIAGNOSIS — Z8673 Personal history of transient ischemic attack (TIA), and cerebral infarction without residual deficits: Secondary | ICD-10-CM | POA: Insufficient documentation

## 2013-04-23 DIAGNOSIS — F40298 Other specified phobia: Secondary | ICD-10-CM | POA: Insufficient documentation

## 2013-04-23 LAB — URINALYSIS, ROUTINE W REFLEX MICROSCOPIC
Glucose, UA: >1000 mg/dL — AB
Hgb urine dipstick: NEGATIVE
Leukocytes, UA: NEGATIVE
Specific Gravity, Urine: 1.031 — ABNORMAL HIGH (ref 1.005–1.030)
Urobilinogen, UA: 0.2 mg/dL (ref 0.0–1.0)

## 2013-04-23 LAB — CBC WITH DIFFERENTIAL/PLATELET
Basophils Absolute: 0 10*3/uL (ref 0.0–0.1)
Eosinophils Absolute: 0.1 10*3/uL (ref 0.0–0.7)
Eosinophils Relative: 1 % (ref 0–5)
Lymphocytes Relative: 38 % (ref 12–46)
MCH: 26.7 pg (ref 26.0–34.0)
MCV: 78.8 fL (ref 78.0–100.0)
Neutrophils Relative %: 54 % (ref 43–77)
Platelets: 223 10*3/uL (ref 150–400)
RDW: 13.3 % (ref 11.5–15.5)
WBC: 6 10*3/uL (ref 4.0–10.5)

## 2013-04-23 LAB — BASIC METABOLIC PANEL
Calcium: 9.2 mg/dL (ref 8.4–10.5)
GFR calc non Af Amer: >90 mL/min (ref >90)
Potassium: 3.7 mEq/L (ref 3.5–5.1)
Sodium: 133 mEq/L — ABNORMAL LOW (ref 135–145)

## 2013-04-23 LAB — URINE MICROSCOPIC-ADD ON

## 2013-04-23 NOTE — ED Notes (Addendum)
Pt states today @ 1700 became lightheaded, L arm felt heavy, denies numbness. Pt states he layed down now symptoms resolved. Pt states last time he had these symptoms he was + for CVA. Pt A & O, steady gait, denies pain

## 2013-04-23 NOTE — ED Notes (Signed)
Pt states he has no pain but he does have heaviness in left arm and dizziness,  States his blood "sugars" are usually high he checks them every 2 to 3 days.  Pt states he doesn't know his A1C

## 2013-04-23 NOTE — ED Notes (Signed)
Pt ambulated to bathroom without assistance 

## 2013-04-23 NOTE — ED Notes (Signed)
Pt aware of need of urine sample. Urinal at bedside 

## 2013-04-24 ENCOUNTER — Observation Stay (HOSPITAL_COMMUNITY): Payer: Medicare Other

## 2013-04-24 ENCOUNTER — Encounter (HOSPITAL_COMMUNITY): Payer: Self-pay | Admitting: Internal Medicine

## 2013-04-24 DIAGNOSIS — E119 Type 2 diabetes mellitus without complications: Secondary | ICD-10-CM

## 2013-04-24 DIAGNOSIS — I10 Essential (primary) hypertension: Secondary | ICD-10-CM

## 2013-04-24 DIAGNOSIS — G459 Transient cerebral ischemic attack, unspecified: Secondary | ICD-10-CM | POA: Diagnosis present

## 2013-04-24 DIAGNOSIS — I517 Cardiomegaly: Secondary | ICD-10-CM

## 2013-04-24 LAB — GLUCOSE, CAPILLARY: Glucose-Capillary: 270 mg/dL — ABNORMAL HIGH (ref 70–99)

## 2013-04-24 MED ORDER — INSULIN ASPART 100 UNIT/ML ~~LOC~~ SOLN
5.0000 [IU] | Freq: Once | SUBCUTANEOUS | Status: AC
  Administered 2013-04-24: 5 [IU] via SUBCUTANEOUS

## 2013-04-24 MED ORDER — HEPARIN SODIUM (PORCINE) 5000 UNIT/ML IJ SOLN
5000.0000 [IU] | Freq: Three times a day (TID) | INTRAMUSCULAR | Status: DC
  Administered 2013-04-24 – 2013-04-25 (×5): 5000 [IU] via SUBCUTANEOUS
  Filled 2013-04-24 (×7): qty 1

## 2013-04-24 MED ORDER — ATORVASTATIN CALCIUM 20 MG PO TABS
20.0000 mg | ORAL_TABLET | Freq: Every day | ORAL | Status: DC
  Administered 2013-04-24 – 2013-04-25 (×2): 20 mg via ORAL
  Filled 2013-04-24 (×3): qty 1

## 2013-04-24 MED ORDER — HYDROCHLOROTHIAZIDE 25 MG PO TABS
25.0000 mg | ORAL_TABLET | Freq: Every day | ORAL | Status: DC
  Administered 2013-04-24 – 2013-04-25 (×2): 25 mg via ORAL
  Filled 2013-04-24 (×2): qty 1

## 2013-04-24 MED ORDER — METFORMIN HCL 500 MG PO TABS
500.0000 mg | ORAL_TABLET | Freq: Two times a day (BID) | ORAL | Status: DC
  Administered 2013-04-24: 500 mg via ORAL
  Filled 2013-04-24 (×3): qty 1

## 2013-04-24 MED ORDER — METFORMIN HCL 500 MG PO TABS
500.0000 mg | ORAL_TABLET | Freq: Two times a day (BID) | ORAL | Status: DC
  Filled 2013-04-24: qty 1

## 2013-04-24 MED ORDER — IOHEXOL 350 MG/ML SOLN
50.0000 mL | Freq: Once | INTRAVENOUS | Status: AC | PRN
  Administered 2013-04-24: 50 mL via INTRAVENOUS

## 2013-04-24 MED ORDER — LISINOPRIL 10 MG PO TABS
10.0000 mg | ORAL_TABLET | Freq: Every day | ORAL | Status: DC
  Administered 2013-04-24 – 2013-04-25 (×2): 10 mg via ORAL
  Filled 2013-04-24 (×2): qty 1

## 2013-04-24 MED ORDER — INSULIN ASPART 100 UNIT/ML ~~LOC~~ SOLN
0.0000 [IU] | Freq: Three times a day (TID) | SUBCUTANEOUS | Status: DC
  Administered 2013-04-24: 15 [IU] via SUBCUTANEOUS
  Administered 2013-04-24: 5 [IU] via SUBCUTANEOUS
  Administered 2013-04-24: 3 [IU] via SUBCUTANEOUS
  Administered 2013-04-25 (×2): 15 [IU] via SUBCUTANEOUS

## 2013-04-24 MED ORDER — CLOPIDOGREL BISULFATE 75 MG PO TABS
75.0000 mg | ORAL_TABLET | Freq: Every day | ORAL | Status: DC
  Administered 2013-04-24 – 2013-04-25 (×2): 75 mg via ORAL
  Filled 2013-04-24 (×2): qty 1

## 2013-04-24 NOTE — ED Provider Notes (Signed)
CSN: [id number]     Arrival date & time 04/23/13  1942 History   First MD Initiated Contact with Patient 04/23/13 2003     Chief Complaint  Patient presents with  . Dizziness   (Consider location/radiation/quality/duration/timing/severity/associated sxs/prior Treatment) HPI Comments: Pt comes in with cc of LUE weakness. Has hx of stroke, DM, HTN - no residuals per patient. Pt reports that around 6 he had sudden onset of LUE heaviness. Sx lasted for about 15 minutes. No other sx with that. Denies numbness, tingling, weakness, headaches, visual complains, gait problems. No chest pain, dib. Symptoms resolved spontaneously.   The history is provided by the patient.    Past Medical History  Diagnosis Date  . Stroke   . Diabetes mellitus   . Hypertension    Past Surgical History  Procedure Laterality Date  . Eye surgery     Family History  Problem Relation Age of Onset  . Cancer Mother   . Hypertension Mother   . Hypertension Father   . Cancer Father   . Diabetes Sister   . Hypertension Sister   . Hypertension Brother   . Diabetes Brother    History  Substance Use Topics  . Smoking status: Never Smoker   . Smokeless tobacco: Not on file  . Alcohol Use: No    Review of Systems  Constitutional: Negative for fever, chills and activity change.  HENT: Negative for neck pain.   Eyes: Negative for visual disturbance.  Respiratory: Negative for cough, chest tightness and shortness of breath.   Cardiovascular: Negative for chest pain.  Gastrointestinal: Negative for abdominal distention.  Genitourinary: Negative for dysuria, enuresis and difficulty urinating.  Musculoskeletal: Negative for arthralgias.  Neurological: Positive for dizziness. Negative for syncope, weakness, light-headedness, numbness and headaches.  Psychiatric/Behavioral: Negative for confusion.    Allergies  Review of patient's allergies indicates no known allergies.  Home Medications   Current Outpatient  Rx  Name  Route  Sig  Dispense  Refill  . aspirin 81 MG chewable tablet   Oral   Chew 81 mg by mouth daily.         . hydrochlorothiazide (HYDRODIURIL) 25 MG tablet   Oral   Take 1 tablet (25 mg total) by mouth daily.   30 tablet   0   . lisinopril (PRINIVIL,ZESTRIL) 20 MG tablet   Oral   Take 0.5 tablets (10 mg total) by mouth daily.   30 tablet   0   . metFORMIN (GLUCOPHAGE) 1000 MG tablet   Oral   Take 0.5 tablets (500 mg total) by mouth 2 (two) times daily with a meal.   60 tablet   0   . meclizine (ANTIVERT) 25 MG tablet   Oral   Take 1 tablet (25 mg total) by mouth 3 (three) times daily as needed for dizziness.   30 tablet   0   . naproxen (NAPROSYN) 500 MG tablet   Oral   Take 1 tablet (500 mg total) by mouth 2 (two) times daily.   30 tablet   0    BP 143/78  Pulse 91  Temp(Src) 97.7 F (36.5 C) (Oral)  Resp 16  Ht 5\' 6"  (1.676 m)  Wt 165 lb (74.844 kg)  BMI 26.64 kg/m2  SpO2 98% Physical Exam  Nursing note and vitals reviewed. Constitutional: He is oriented to person, place, and time. He appears well-developed.  HENT:  Head: Normocephalic and atraumatic.  Eyes: Conjunctivae and EOM are normal. Pupils are equal,  round, and reactive to light.  Neck: Normal range of motion. Neck supple.  Cardiovascular: Normal rate and regular rhythm.   Pulmonary/Chest: Effort normal and breath sounds normal.  Abdominal: Soft. Bowel sounds are normal. He exhibits no distension. There is no tenderness. There is no rebound and no guarding.  Neurological: He is alert and oriented to person, place, and time. No cranial nerve deficit. Coordination normal.  Cerebellar exam is normal (finger to nose) Sensory exam normal for bilateral upper and lower extremities - and patient is able to discriminate between sharp and dull. Motor exam is 4+/5 for bilateral upper and lower extremity, may be slight grip weakness on the LUE.   Skin: Skin is warm.    ED Course  Procedures  (including critical care time) Labs Review Labs Reviewed  GLUCOSE, CAPILLARY - Abnormal; Notable for the following:    Glucose-Capillary 281 (*)    All other components within normal limits  CBC WITH DIFFERENTIAL - Abnormal; Notable for the following:    Hemoglobin 12.5 (*)    HCT 36.9 (*)    All other components within normal limits  BASIC METABOLIC PANEL - Abnormal; Notable for the following:    Sodium 133 (*)    Glucose, Bld 304 (*)    All other components within normal limits  URINALYSIS, ROUTINE W REFLEX MICROSCOPIC - Abnormal; Notable for the following:    Specific Gravity, Urine 1.031 (*)    Glucose, UA >1000 (*)    Protein, ur 100 (*)    All other components within normal limits  TROPONIN I  URINE MICROSCOPIC-ADD ON   Imaging Review No results found.  MDM   1. TIA (transient ischemic attack)   2. DM (diabetes mellitus)   3. HTN (hypertension)     Pt comes in with cc of left upper extremity heaviness. No cardiac sx. Hx of strokes - and so called Neuro for concerns for TIA. Dr. Roseanne Reno recommends admission. ABCD2 score is 3.  Will admit and transfer as per Neuro request.   Derwood Kaplan, MD 04/24/13 [phone]

## 2013-04-24 NOTE — Progress Notes (Signed)
*  PRELIMINARY RESULTS* Echocardiogram 2D Echocardiogram has been performed.  Ricky Terry 04/24/2013, 10:12 AM

## 2013-04-24 NOTE — Evaluation (Signed)
Occupational Therapy Evaluation Patient Details Name: Ricky Terry MRN: [id number] DOB: Sep 24, 1945 Today's Date: 04/24/2013 Time: 1191-4782 OT Time Calculation (min): 13 min  OT Assessment / Plan / Recommendation History of present illness Adm for transient LUE weakness and dizzines, being worked up for TIA.    Clinical Impression   Pt admitted with above. Pt reports symptoms have resolved. Presenting to OT back at baseline. Educated pt on stroke signs and symptoms. No further acute OT needed. Signing off.    OT Assessment  Patient does not need any further OT services    Follow Up Recommendations  No OT follow up    Barriers to Discharge      Equipment Recommendations  None recommended by OT    Recommendations for Other Services    Frequency       Precautions / Restrictions Precautions Precautions: Fall Restrictions Weight Bearing Restrictions: No   Pertinent Vitals/Pain See vitals    ADL  Upper Body Bathing: Performed;Independent Where Assessed - Upper Body Bathing: Unsupported sitting Lower Body Bathing: Simulated;Independent Where Assessed - Lower Body Bathing: Unsupported sit to stand Upper Body Dressing: Performed;Independent Where Assessed - Upper Body Dressing: Unsupported sitting Lower Body Dressing: Performed;Independent Where Assessed - Lower Body Dressing: Unsupported sit to stand Toilet Transfer: Simulated;Independent Toilet Transfer Method: Sit to Barista:  (bed) Tub/Shower Transfer: Performed;Independent Tub/Shower Transfer Method: Ambulating Transfers/Ambulation Related to ADLs: independent ADL Comments: Educated pt on stroke signs and symptoms.    OT Diagnosis:    OT Problem List:   OT Treatment Interventions:     OT Goals(Current goals can be found in the care plan section)    Visit Information  Last OT Received On: 04/24/13 Assistance Needed: +1 History of Present Illness: Adm for transient LUE weakness and  dizzines, being worked up for TIA.        Prior Functioning     Home Living Family/patient expects to be discharged to:: Private residence Living Arrangements: Alone Available Help at Discharge: Family;Available PRN/intermittently Type of Home: Apartment Home Access: Level entry Home Layout: One level Home Equipment: None Prior Function Level of Independence: Independent Comments: drives for meals on wheels Communication Communication: No difficulties Dominant Hand: Right         Vision/Perception Vision - History Baseline Vision: Wears glasses all the time Patient Visual Report: No change from baseline   Cognition  Cognition Arousal/Alertness: Awake/alert Behavior During Therapy: WFL for tasks assessed/performed Overall Cognitive Status: Within Functional Limits for tasks assessed    Extremity/Trunk Assessment Upper Extremity Assessment Upper Extremity Assessment: Overall WFL for tasks assessed Lower Extremity Assessment Lower Extremity Assessment: Overall WFL for tasks assessed     Mobility Bed Mobility Bed Mobility: Supine to Sit Supine to Sit: 7: Independent Transfers Transfers: Sit to Stand;Stand to Sit Sit to Stand: 7: Independent;From bed Stand to Sit: 7: Independent;To bed     Exercise     Balance High Level Balance High Level Balance Activites: Backward walking;Direction changes;Turns;Sudden stops;Head turns High Level Balance Comments: no difficulties with above challenges   End of Session OT - End of Session Activity Tolerance: Patient tolerated treatment well Patient left: in bed Nurse Communication: Mobility status  GO Functional Assessment Tool Used: clinical judgement Functional Limitation: Self care Self Care Current Status (N5621): 0 percent impaired, limited or restricted Self Care Goal Status (H0865): 0 percent impaired, limited or restricted Self Care Discharge Status [phone]): 0 percent impaired, limited or restricted    04/24/2013 Cipriano Mile OTR/L  Pager [phone] Office [phone]  Cipriano Mile 04/24/2013, 4:16 PM

## 2013-04-24 NOTE — Consult Note (Signed)
Referring Physician: Dr. Izola Price    Chief Complaint: Transient weakness of left upper extremity.  HPI: Ricky Terry is an 67 y.o. male with a history of diabetes mellitus, hypertension and hyperlipidemia as well as history of TIA in 2012, presenting with transient weakness involving left upper extremity with onset at 5 PM today. Symptoms lasted 5-10 minutes then resolved with no recurrence. Patient has been taking aspirin 81 mg per day. CT scan of his head showed no acute intracranial abnormality. NIH stroke score at this point is 0. He had no associated changes in speech and had no lower extremity symptoms.  LSN: 5 PM on 04/23/2013 tPA Given: No: Symptoms resolved MRankin: 0  Past Medical History  Diagnosis Date  . Stroke   . Diabetes mellitus   . Hypertension     Family History  Problem Relation Age of Onset  . Cancer Mother   . Hypertension Mother   . Hypertension Father   . Cancer Father   . Diabetes Sister   . Hypertension Sister   . Hypertension Brother   . Diabetes Brother      Medications:  I have reviewed the patient's current medications. Prior to Admission:  Prescriptions prior to admission  Medication Sig Dispense Refill  . aspirin 81 MG chewable tablet Chew 81 mg by mouth daily.      . hydrochlorothiazide (HYDRODIURIL) 25 MG tablet Take 1 tablet (25 mg total) by mouth daily.  30 tablet  0  . lisinopril (PRINIVIL,ZESTRIL) 20 MG tablet Take 0.5 tablets (10 mg total) by mouth daily.  30 tablet  0  . metFORMIN (GLUCOPHAGE) 1000 MG tablet Take 0.5 tablets (500 mg total) by mouth 2 (two) times daily with a meal.  60 tablet  0  . meclizine (ANTIVERT) 25 MG tablet Take 1 tablet (25 mg total) by mouth 3 (three) times daily as needed for dizziness.  30 tablet  0  . naproxen (NAPROSYN) 500 MG tablet Take 1 tablet (500 mg total) by mouth 2 (two) times daily.  30 tablet  0    ROS: History obtained from the patient  General ROS: negative for - chills, fatigue, fever,  night sweats, weight gain or weight loss Psychological ROS: negative for - behavioral disorder, hallucinations, memory difficulties, mood swings or suicidal ideation Ophthalmic ROS: negative for - blurry vision, double vision, eye pain or loss of vision ENT ROS: negative for - epistaxis, nasal discharge, oral lesions, sore throat, tinnitus or vertigo Allergy and Immunology ROS: negative for - hives or itchy/watery eyes Hematological and Lymphatic ROS: negative for - bleeding problems, bruising or swollen lymph nodes Endocrine ROS: negative for - galactorrhea, hair pattern changes, polydipsia/polyuria or temperature intolerance Respiratory ROS: negative for - cough, hemoptysis, shortness of breath or wheezing Cardiovascular ROS: negative for - chest pain, dyspnea on exertion, edema or irregular heartbeat Gastrointestinal ROS: negative for - abdominal pain, diarrhea, hematemesis, nausea/vomiting or stool incontinence Genito-Urinary ROS: negative for - dysuria, hematuria, incontinence or urinary frequency/urgency Musculoskeletal ROS: negative for - joint swelling or muscular weakness Neurological ROS: as noted in HPI Dermatological ROS: negative for rash and skin lesion changes  Physical Examination: Blood pressure 141/53, pulse 86, temperature 98.7 F (37.1 C), temperature source Oral, resp. rate 18, height 5\' 6"  (1.676 m), weight 74.844 kg (165 lb), SpO2 100.00%.  Neurologic Examination: Mental Status: Alert, oriented, thought content appropriate.  Speech fluent without evidence of aphasia. Able to follow commands without difficulty. Cranial Nerves: II-Visual fields were normal. III/IV/VI-the pupils of  normal size and reacted normally to light; as prosthesis on the right. Extraocular movements of left were normal. V/VII-no facial numbness and no facial weakness. VIII-normal. X-normal speech. Motor: 5/5 bilaterally with normal tone and bulk Sensory: Normal throughout. Deep Tendon  Reflexes: 2+ and symmetric. Plantars: Mute bilaterally Cerebellar: Normal finger-to-nose testing. Carotid auscultation: Normal  Ct Head Wo Contrast  04/24/2013   *RADIOLOGY REPORT*  Clinical Data: Lightheaded, dizziness, left arm heaviness since 1700, history stroke, hypertension, diabetes  CT HEAD WITHOUT CONTRAST  Technique:  Contiguous axial images were obtained from the base of the skull through the vertex without contrast.  Comparison: 05/09/2011  Findings: Asymmetry of the frontal horns of lateral ventricles, smaller on left, unchanged. Ventricular morphology otherwise normal. No midline shift or mass effect. No intracranial hemorrhage, mass lesion, or evidence of acute infarction. No extra-axial fluid collections. Bones and sinuses unremarkable. Right eye prosthesis noted.  IMPRESSION: No acute intracranial abnormalities.   Original Report Authenticated By: Ulyses Southward, M.D.    Assessment: 67 y.o. male with significant risk factors for stroke, presenting with probable recurrent transient ischemic attack.  Stroke Risk Factors - diabetes mellitus, family history, hyperlipidemia and hypertension  Plan: 1. HgbA1c, fasting lipid panel 2. MRI, MRA  of the brain without contrast-refused due to extremity claustrophobia 3. PT consult, OT consult, Speech consult 4. Echocardiogram 5. Carotid dopplers 6. Prophylactic therapy-Antiplatelet med: Plavix 75 mg per day 7. Risk factor modification 8. Telemetry monitoring     C.R. Roseanne Reno, MD [facility] [phone]  04/24/2013, 5:37 AM

## 2013-04-24 NOTE — Progress Notes (Signed)
Utilization Review completed.  

## 2013-04-24 NOTE — Evaluation (Signed)
Physical Therapy Evaluation Patient Details Name: Ricky Terry MRN: [id number] DOB: [birth year] Today's Date: 04/24/2013 Time: 3086-5784 PT Time Calculation (min): 9 min  PT Assessment / Plan / Recommendation History of Present Illness  Adm for transient LUE weakness and dizzines, being worked up for TIA.   Clinical Impression  Presents to PT back at baseline. No further concerns with mobility or weakness. Educated him on sxs of CVA as well as benefits of regular aerobic exercise to prevent future stroke or TIA.     PT Assessment  Patent does not need any further PT services    Follow Up Recommendations  No PT follow up    Does the patient have the potential to tolerate intense rehabilitation      Barriers to Discharge        Equipment Recommendations  None recommended by PT    Recommendations for Other Services     Frequency      Precautions / Restrictions Precautions Precautions: Fall Restrictions Weight Bearing Restrictions: No   Pertinent Vitals/Pain Denies pain     Mobility  Bed Mobility Bed Mobility: Supine to Sit Supine to Sit: 7: Independent Transfers Transfers: Stand to Sit;Sit to Stand Sit to Stand: 7: Independent Stand to Sit: 7: Independent Ambulation/Gait Ambulation/Gait Assistance: 7: Independent Ambulation Distance (Feet): 200 Feet Assistive device: None Gait Pattern: Within Functional Limits Gait velocity: WFL Stairs: Yes Stairs Assistance: 7: Independent Stair Management Technique: No rails Number of Stairs: 5 Modified Rankin (Stroke Patients Only) Pre-Morbid Rankin Score: No symptoms Modified Rankin: No symptoms        PT Goals(Current goals can be found in the care plan section) Acute Rehab PT Goals PT Goal Formulation: No goals set, d/c therapy  Visit Information  Last PT Received On: 04/24/13 Assistance Needed: +1 History of Present Illness: Adm for transient LUE weakness and dizzines, being worked up for TIA.         Prior Functioning  Home Living Family/patient expects to be discharged to:: Private residence Living Arrangements: Alone Available Help at Discharge: Family;Available PRN/intermittently Type of Home: Apartment Home Access: Level entry Home Layout: One level Home Equipment: None Prior Function Level of Independence: Independent Comments: drives for meals on wheels Communication Communication: No difficulties    Cognition  Cognition Arousal/Alertness: Awake/alert Behavior During Therapy: WFL for tasks assessed/performed Overall Cognitive Status: Within Functional Limits for tasks assessed    Extremity/Trunk Assessment Upper Extremity Assessment Upper Extremity Assessment: Defer to OT evaluation Lower Extremity Assessment Lower Extremity Assessment: Overall WFL for tasks assessed   Balance High Level Balance High Level Balance Activites: Backward walking;Direction changes;Turns;Sudden stops;Head turns High Level Balance Comments: no difficulties with above challenges  End of Session PT - End of Session Equipment Utilized During Treatment: Gait belt Activity Tolerance: Patient tolerated treatment well Patient left: Other (comment) (in room )  GP Functional Limitation: Mobility: Walking and moving around Mobility: Walking and Moving Around Current Status [phone]): 0 percent impaired, limited or restricted Mobility: Walking and Moving Around Goal Status [phone]): 0 percent impaired, limited or restricted Mobility: Walking and Moving Around Discharge Status [phone]): 0 percent impaired, limited or restricted   Katelyn Kohlmeyer H 04/24/2013, 3:42 PM

## 2013-04-24 NOTE — H&P (Signed)
[facility] History and Physical  Ricky Terry ZOX:[id number] DOB: [birth year]    PCP:   Alva Garnet., MD   Chief Complaint: left upper extremity weakness for 10 minutes.  HPI: Ricky Terry is an 67 y.o. 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  He didn't have any slurred speech nor facial droop.  He denied palpitation, HA, lower extremity weakness.  Symptoms resolved upon arrival to ER (not TPA candidate).  He was admitted 2012, and at that time, coratid US showed higher velocity of the Right MCA and Right Basilar arteries of unclear clinical significant.  He has claustrophobia and didn't agree to MRI at that time.  He still refuses MRI at this time, despite being offered sedatives.  He understand the limitation of CT vs MRI and is still adamant about this.  He denied tobacco or drug use, and said he has been compliant with ASA.  EDP consulted [facility] and asked hospitalist to admit for further TIA work up.  Rewiew of Systems:  Constitutional: Negative for malaise, fever and chills. No significant weight loss or weight gain Eyes: Negative for eye pain, redness and discharge,  visual changes, or flashes of light. ENMT: Negative for ear pain, hoarseness, nasal congestion, sinus pressure and sore throat. No headaches; tinnitus, drooling, or problem swallowing. Cardiovascular: Negative for chest pain, palpitations, diaphoresis, dyspnea and peripheral edema. ; No orthopnea, PND Respiratory: Negative for cough, hemoptysis, wheezing and stridor. No pleuritic chestpain. Gastrointestinal: Negative for nausea, vomiting, diarrhea, constipation, abdominal pain, melena, blood in stool, hematemesis, jaundice and rectal bleeding.    Genitourinary: Negative for frequency, dysuria,  incontinence,flank pain and hematuria; Musculoskeletal: Negative for back pain and neck pain. Negative for swelling and trauma.;  Skin: . Negative for pruritus, rash, abrasions, bruising and skin lesion.; ulcerations Neuro: Negative for headache, lightheadedness and neck stiffness. Negative  altered level of consciousness , altered mental status, extremity weakness, burning feet, involuntary movement, seizure and syncope.  Psych: negative for anxiety, depression, insomnia, tearfulness, panic attacks, hallucinations, paranoia, suicidal or homicidal ideation    Past Medical History  Diagnosis Date  . Stroke   . Diabetes mellitus   . Hypertension     Past Surgical History  Procedure Laterality Date  . Eye surgery      Medications:  HOME MEDS: Prior to Admission medications   Medication Sig Start Date End Date Taking? Authorizing Provider  aspirin 81 MG chewable tablet Chew 81 mg by mouth daily.   Yes Historical Provider, MD  hydrochlorothiazide (HYDRODIURIL) 25 MG tablet Take 1 tablet (25 mg total) by mouth daily. 03/09/13  Yes John-Adam Bonk, MD  lisinopril (PRINIVIL,ZESTRIL) 20 MG tablet Take 0.5 tablets (10 mg total) by mouth daily. 03/09/13  Yes John-Adam Bonk, MD  metFORMIN (GLUCOPHAGE) 1000 MG tablet Take 0.5 tablets (500 mg total) by mouth 2 (two) times daily with a meal. 03/09/13  Yes John-Adam Bonk, MD  meclizine (ANTIVERT) 25 MG tablet Take 1 tablet (25 mg total) by mouth 3 (three) times daily as needed for dizziness. 03/09/13   John-Adam Bonk, MD  naproxen (NAPROSYN) 500 MG tablet Take 1 tablet (500 mg total) by mouth 2 (two) times daily. 01/26/13   Angus Seller, PA-C     Allergies:  No Known Allergies  Social History:  reports that he has never smoked. He does not have any smokeless tobacco history on file. He reports that he does not drink alcohol or use illicit drugs.  Family History: Family History  Problem Relation Age of Onset  . Cancer Mother   . Hypertension  Mother   . Hypertension Father   . Cancer Father   . Diabetes Sister   . Hypertension Sister   . Hypertension Brother   . Diabetes Brother      Physical Exam: Filed Vitals:   04/23/13 1959  BP: 143/78  Pulse: 91  Temp: 97.7 F (36.5 C)  TempSrc: Oral  Resp: 16  Height: 5\' 6"  (1.676 m)  Weight: 74.844 kg (165 lb)  SpO2: 98%   Blood pressure 143/78, pulse 91, temperature 97.7 F (36.5 C), temperature source Oral, resp. rate 16, height 5\' 6"  (1.676 m), weight 74.844 kg (165 lb), SpO2 98.00%.  GEN:  Pleasant patient lying in the stretcher in no acute distress; cooperative with exam. PSYCH:  alert and oriented x4; does not appear anxious or depressed; affect is appropriate. HEENT: Mucous membranes pink and anicteric; OD:  S/p enucleation. EOM intact OS.;no cervical lymphadenopathy nor thyromegaly or carotid bruit; no JVD; There were no stridor. Neck is very supple. Breasts:: Not examined CHEST WALL: No tenderness CHEST: Normal respiration, clear to auscultation bilaterally.  HEART: Regular rate and rhythm.  There are no murmur, rub, or gallops.   BACK: No kyphosis or scoliosis; no CVA tenderness ABDOMEN: soft and non-tender; no masses, no organomegaly, normal abdominal bowel sounds; no pannus; no intertriginous candida. There is no rebound and no distention. Rectal Exam: Not done EXTREMITIES: No bone or joint deformity; age-appropriate arthropathy of the hands and knees; no edema; no ulcerations.  There is no calf tenderness. Genitalia: not examined PULSES: 2+ and symmetric SKIN: Normal hydration no rash or ulceration CNS: Cranial nerves 2-12 grossly intact no focal lateralizing neurologic deficit.  Speech is fluent; uvula elevated with phonation, facial symmetry and tongue midline. DTR are normal bilaterally, cerebella exam is intact, barbinski is negative and strength is slightly weakner on the left. No sensory loss.   Labs on Admission:  Basic Metabolic Panel:  Recent  Labs Lab 04/23/13 2053  NA 133*  K 3.7  CL 100  CO2 23  GLUCOSE 304*  BUN 17  CREATININE 0.73  CALCIUM 9.2   Liver Function Tests: No results found for this basename: AST, ALT, ALKPHOS, BILITOT, PROT, ALBUMIN,  in the last 168 hours No results found for this basename: LIPASE, AMYLASE,  in the last 168 hours No results found for this basename: AMMONIA,  in the last 168 hours CBC:  Recent Labs Lab 04/23/13 2053  WBC 6.0  NEUTROABS 3.3  HGB 12.5*  HCT 36.9*  MCV 78.8  PLT 223   Cardiac Enzymes:  Recent Labs Lab 04/23/13 2053  TROPONINI <0.30    CBG:  Recent Labs Lab 04/23/13 2010  GLUCAP 281*     Radiological Exams on Admission: No results found.  Assessment/Plan Present on Admission:  . TIA (transient ischemic attack) . DM (diabetes mellitus) . HTN (hypertension) EYE ENUCLEATION OD CLAUSTROPHOBIA.  PLAN: Will admit and transfer him to [facility] for further TIA work up.  He will get repeated carotid doppler (tortuosity of the vessels can mimic higher velocity especially with no evidence of obtructive plaques)  an ECHO.  Will get plain head CT, but no MRI.  Neurology will make further recommendation.  Because he essentially failed ASA, will  change it to Plavix.  For his DM, will continue Metformin, and add SSI.  He is stable, full code, and will be admitted to [facility] service.  Thank you for allowing me to participate in his care, once more.  Other plans as per orders.  Code Status: FULL Unk Lightning, MD. [facility] Pager [phone] 7pm to 7am.  04/24/2013, 12:20 AM

## 2013-04-24 NOTE — Progress Notes (Signed)
Stroke Team Progress Note  HISTORY Ricky Terry is a 67 y.o. male with a history of diabetes mellitus, hypertension and hyperlipidemia as well as history of TIA in 2012, presenting with transient weakness involving left upper extremity with onset at 5 PM 04/23/2013. Symptoms lasted 5-10 minutes then resolved with no recurrence. Patient had been taking aspirin 81 mg per day. CT scan of his head showed no acute intracranial abnormality. NIH stroke score was seen by Dr. Roseanne Reno was 0. He had no associated changes in speech and had no lower extremity symptoms.   LSN: 5 PM on 04/23/2013  tPA Given: No: Symptoms resolved  MRankin: 0 .  SUBJECTIVE   OBJECTIVE Most recent Vital Signs: Filed Vitals:   04/23/13 1959 04/24/13 0141 04/24/13 0307 04/24/13 0522  BP: 143/78 163/86 138/60 141/53  Pulse: 91 81 84 86  Temp: 97.7 F (36.5 C) 97.6 F (36.4 C) 98.4 F (36.9 C) 98.7 F (37.1 C)  TempSrc: Oral Oral Oral Oral  Resp: 16 18 18 18   Height: 5\' 6"  (1.676 m)     Weight: 74.844 kg (165 lb)     SpO2: 98% 98% 97% 100%   CBG (last 3)   Recent Labs  04/23/13 2010 04/24/13 0113  GLUCAP 281* 270*    IV Fluid Intake:     MEDICATIONS  . clopidogrel  75 mg Oral Q breakfast  . heparin  5,000 Units Subcutaneous Q8H  . hydrochlorothiazide  25 mg Oral Daily  . insulin aspart  0-15 Units Subcutaneous TID WC  . lisinopril  10 mg Oral Daily  . metFORMIN  500 mg Oral BID WC   PRN:    Diet:  Carb Control thin liquids Activity:  Bathroom privileges with assistance DVT Prophylaxis: Subcutaneous heparin  CLINICALLY SIGNIFICANT STUDIES Basic Metabolic Panel:   Recent Labs Lab 04/23/13 2053  NA 133*  K 3.7  CL 100  CO2 23  GLUCOSE 304*  BUN 17  CREATININE 0.73  CALCIUM 9.2   Liver Function Tests: No results found for this basename: AST, ALT, ALKPHOS, BILITOT, PROT, ALBUMIN,  in the last 168 hours CBC:   Recent Labs Lab 04/23/13 2053  WBC 6.0  NEUTROABS 3.3  HGB 12.5*  HCT  36.9*  MCV 78.8  PLT 223   Coagulation: No results found for this basename: LABPROT, INR,  in the last 168 hours Cardiac Enzymes:   Recent Labs Lab 04/23/13 2053  TROPONINI <0.30   Urinalysis:   Recent Labs Lab 04/23/13 2227  COLORURINE YELLOW  LABSPEC 1.031*  PHURINE 5.5  GLUCOSEU >1000*  HGBUR NEGATIVE  BILIRUBINUR NEGATIVE  KETONESUR NEGATIVE  PROTEINUR 100*  UROBILINOGEN 0.2  NITRITE NEGATIVE  LEUKOCYTESUR NEGATIVE   Lipid Panel    Component Value Date/Time   CHOL 305* 05/10/2011 0405   TRIG 181* 05/10/2011 0405   HDL 60 05/10/2011 0405   CHOLHDL 5.1 05/10/2011 0405   VLDL 36 05/10/2011 0405   LDLCALC 209* 05/10/2011 0405   HgbA1C  Lab Results  Component Value Date   HGBA1C 9.9* 05/11/2011    Urine Drug Screen:   No results found for this basename: labopia,  cocainscrnur,  labbenz,  amphetmu,  thcu,  labbarb    Alcohol Level: No results found for this basename: ETH,  in the last 168 hours  Ct Head Wo Contrast 04/24/2013 No acute intracranial abnormalities.      MRI of the brain  canceled  MRA of the brain  canceled  2D Echocardiogram  pending  Carotid Doppler  pending  CXR    EKG  sinus rhythm rate 80 beats per minute  Therapy Recommendations pending  Physical Exam    Neurologic Examination:  Mental Status:  Alert, oriented, thought content appropriate. Speech fluent without evidence of aphasia. Able to follow commands without difficulty.  Cranial Nerves:  II-Visual fields were normal.  III/IV/VI-the pupils of normal size and reacted normally to light; as prosthesis on the right. Extraocular movements of left were normal.  V/VII-no facial numbness and no facial weakness.  VIII-normal.  X-normal speech.  Motor: 5/5 bilaterally with normal tone and bulk  Sensory: Normal throughout.  Deep Tendon Reflexes: 2+ and symmetric.  Plantars: Mute bilaterally  Cerebellar: Normal finger-to-nose testing.  Carotid auscultation:  Normal    ASSESSMENT Mr. Ricky Terry is a 67 y.o. male presenting with transient left upper extremity paresis.Suspect right brain TIA. TPA was not given as the patient's symptoms quickly resolved prior to being seen. CT scan of the head was negative for infarct.  On aspirin 81 mg orally every day prior to admission. Now on clopidogrel 75 mg orally every day for secondary stroke prevention. Patient with resultant resolution of deficits. Work up underway.   TIA in 2012  Diabetes mellitus - hemoglobin A1c 9.9  Hypertension - lisinopril and hydrochlorothiazide  Hyperlipidemia - cholesterol 305 LDL 209 - no statin prior to admission. Currently not on a statin.  Previous stroke  The patient declined MRI secondary to claustrophobia.  Status post right enucleation.  Hospital day # 1  TREATMENT/PLAN  Continue clopidogrel 75 mg orally every day for secondary stroke prevention.  Will start Lipitor  Await 2-D echo and carotid Dopplers.  Awake therapist's evaluations  Risk factor modifications  Will order CTA since pt. Declines MRI secondary to claustrophobia.   Delton See PA-C [facility] Pager [phone] 04/24/2013, 8:08 AM  I have personally obtained a history, examined the patient, evaluated imaging results, and formulated the assessment and plan of care. I agree with the above. Delia Heady, MD

## 2013-04-24 NOTE — Progress Notes (Signed)
Pt seen and examined at bedside, please see earlier admission note by Dr. Conley Rolls. Pt admitted for stroke work up. Refusing MRI. 2 D ECHO and carotid doppler pending. Anticipate d/c tomorrow AM.  Debbora Presto, MD  [facility] Pager [phone]  If 7PM-7AM, please contact night-coverage [email or web address] Password [email or web address]

## 2013-04-25 DIAGNOSIS — G459 Transient cerebral ischemic attack, unspecified: Secondary | ICD-10-CM

## 2013-04-25 LAB — CBC
Hemoglobin: 13.2 g/dL (ref 13.0–17.0)
RBC: 4.87 MIL/uL (ref 4.22–5.81)

## 2013-04-25 LAB — BASIC METABOLIC PANEL
CO2: 25 mEq/L (ref 19–32)
Glucose, Bld: 300 mg/dL — ABNORMAL HIGH (ref 70–99)
Potassium: 3.7 mEq/L (ref 3.5–5.1)
Sodium: 131 mEq/L — ABNORMAL LOW (ref 135–145)

## 2013-04-25 LAB — GLUCOSE, CAPILLARY
Glucose-Capillary: 394 mg/dL — ABNORMAL HIGH (ref 70–99)
Glucose-Capillary: 376 mg/dL — ABNORMAL HIGH (ref 70–99)

## 2013-04-25 LAB — LIPID PANEL
LDL Cholesterol: UNDETERMINED mg/dL (ref 0–99)
Triglycerides: 1339 mg/dL — ABNORMAL HIGH (ref <150)
VLDL: UNDETERMINED mg/dL (ref 0–40)

## 2013-04-25 MED ORDER — CLOPIDOGREL BISULFATE 75 MG PO TABS: 75.0000 mg | ORAL_TABLET | Freq: Every day | ORAL | Status: DC

## 2013-04-25 MED ORDER — ATORVASTATIN CALCIUM 20 MG PO TABS: 20.0000 mg | ORAL_TABLET | Freq: Every day | ORAL | Status: DC

## 2013-04-25 NOTE — Progress Notes (Signed)
Stroke Team Progress Note  HISTORY Ricky Terry is a 67 y.o. male with a history of diabetes mellitus, hypertension and hyperlipidemia as well as history of TIA in 2012, presenting with transient weakness involving left upper extremity with onset at 5 PM 04/23/2013. Symptoms lasted 5-10 minutes then resolved with no recurrence. Patient had been taking aspirin 81 mg per day. CT scan of his head showed no acute intracranial abnormality. NIH stroke score when seen by Dr. Roseanne Reno was 0. He had no associated changes in speech and had no lower extremity symptoms.   LSN: 5 PM on 04/23/2013  tPA Given: No: Symptoms resolved  MRankin: 0 .  SUBJECTIVE The patient's wife is present this morning. The patient has no complaints. He feels he is back to baseline.  OBJECTIVE Most recent Vital Signs: Filed Vitals:   04/24/13 1740 04/24/13 2200 04/25/13 0200 04/25/13 0600  BP: 133/62 140/76 135/72 120/66  Pulse: 87 85 82 73  Temp: 97.6 F (36.4 C) 98 F (36.7 C) 98 F (36.7 C) 97.4 F (36.3 C)  TempSrc: Oral     Resp: 18 18 18 18   Height:      Weight:    71.4 kg (157 lb 6.5 oz)  SpO2: 100% 99% 100% 96%   CBG (last 3)   Recent Labs  04/24/13 1706 04/24/13 2154 04/25/13 0706  GLUCAP 154* 349* 394*    IV Fluid Intake:     MEDICATIONS  . atorvastatin  20 mg Oral q1800  . clopidogrel  75 mg Oral Q breakfast  . heparin  5,000 Units Subcutaneous Q8H  . hydrochlorothiazide  25 mg Oral Daily  . insulin aspart  0-15 Units Subcutaneous TID WC  . lisinopril  10 mg Oral Daily  . [START ON 04/26/2013] metFORMIN  500 mg Oral BID WC   PRN:    Diet:  Carb Control thin liquids Activity:  Bathroom privileges with assistance DVT Prophylaxis: Subcutaneous heparin  CLINICALLY SIGNIFICANT STUDIES Basic Metabolic Panel:   Recent Labs Lab 04/23/13 2053 04/25/13 0535  NA 133* 131*  K 3.7 3.7  CL 100 97  CO2 23 25  GLUCOSE 304* 300*  BUN 17 16  CREATININE 0.73 0.77  CALCIUM 9.2 9.6   Liver  Function Tests: No results found for this basename: AST, ALT, ALKPHOS, BILITOT, PROT, ALBUMIN,  in the last 168 hours CBC:   Recent Labs Lab 04/23/13 2053 04/25/13 0535  WBC 6.0 5.8  NEUTROABS 3.3  --   HGB 12.5* 13.2  HCT 36.9* 38.4*  MCV 78.8 78.9  PLT 223 210   Coagulation: No results found for this basename: LABPROT, INR,  in the last 168 hours Cardiac Enzymes:   Recent Labs Lab 04/23/13 2053  TROPONINI <0.30   Urinalysis:   Recent Labs Lab 04/23/13 2227  COLORURINE YELLOW  LABSPEC 1.031*  PHURINE 5.5  GLUCOSEU >1000*  HGBUR NEGATIVE  BILIRUBINUR NEGATIVE  KETONESUR NEGATIVE  PROTEINUR 100*  UROBILINOGEN 0.2  NITRITE NEGATIVE  LEUKOCYTESUR NEGATIVE   Lipid Panel    Component Value Date/Time   CHOL 305* 05/10/2011 0405   TRIG 181* 05/10/2011 0405   HDL 60 05/10/2011 0405   CHOLHDL 5.1 05/10/2011 0405   VLDL 36 05/10/2011 0405   LDLCALC 209* 05/10/2011 0405   HgbA1C  Lab Results  Component Value Date   HGBA1C 9.9* 05/11/2011    Urine Drug Screen:   No results found for this basename: labopia,  cocainscrnur,  labbenz,  amphetmu,  thcu,  labbarb  Alcohol Level: No results found for this basename: ETH,  in the last 168 hours  Ct Head Wo Contrast 04/24/2013 No acute intracranial abnormalities.      MRI of the brain  Canceled.  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  Mild diffuse cerebrovascular disease. Please see full report for details.  2D Echocardiogram  ejection fraction 60-65%. No cardiac source of embolism identified.  Carotid Doppler  pending  CXR    EKG  sinus rhythm rate 80 beats per minute  Therapy Recommendations -  No physical or occupational therapy followup recommended  Physical Exam    Neurologic Examination:  Mental Status:  Alert, oriented, thought content appropriate. Speech fluent without  evidence of aphasia. Able to follow commands without difficulty.  Cranial Nerves:  II-Visual fields were normal.  III/IV/VI-the pupils of normal size and reacted normally to light; as prosthesis on the right. Extraocular movements of left were normal.  V/VII-no facial numbness and no facial weakness.  VIII-normal.  X-normal speech.  Motor: 5/5 bilaterally with normal tone and bulk  Sensory: Normal throughout.  Deep Tendon Reflexes: 2+ and symmetric.  Plantars: Mute bilaterally  Cerebellar: Normal finger-to-nose testing.  Carotid auscultation: Normal    ASSESSMENT Mr. Ricky Terry is a 67 y.o. male presenting with transient left upper extremity paresis.Suspect right brain TIA. TPA was not given as the patient's symptoms quickly resolved prior to being seen. CT scan of the head was negative for infarct.  On aspirin 81 mg orally every day prior to admission. Now on clopidogrel 75 mg orally every day for secondary stroke prevention. Patient with resultant resolution of deficits. Work up underway.   TIA in 2012  Diabetes mellitus - hemoglobin A1c 9.9  Hypertension - lisinopril and hydrochlorothiazide  Hyperlipidemia - cholesterol 305 LDL 209 - no statin prior to admission. Currently not on a statin.  Previous stroke  The patient declined MRI secondary to claustrophobia.  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  Hospital day # 2  TREATMENT/PLAN  Continue clopidogrel 75 mg orally every day for secondary stroke prevention.  Lipitor added  Await carotid Dopplers.  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 - Hold metformin for 48 hours after contrast.  Patient referred discharge after carotid Dopplers.   Followup Dr. Pearlean Brownie 2 months.   The stroke team will sign off at this time. Please call with questions or if we can be of further  assistance.  Delton See PA-C [facility] Pager [phone] 04/25/2013, 8:16 AM  I have personally obtained a history, examined the patient, evaluated imaging results, and formulated the assessment and plan of care. I agree with the above.  Delia Heady, MD

## 2013-04-25 NOTE — Progress Notes (Signed)
Pt IV removed, discharge instructions given and education completed. Pt reports giving urine sample at [facility] and did not wish to give another but was ready to be discharge. Pt had to concerns and RN addressed questions about follow up appts and new and old medications. Pt reported being comfortable with discharge.

## 2013-04-25 NOTE — Progress Notes (Signed)
Utilization Review completed.  

## 2013-04-25 NOTE — Progress Notes (Signed)
VASCULAR LAB PRELIMINARY  PRELIMINARY  PRELIMINARY  PRELIMINARY  Carotid Dopplers completed.    Preliminary report:  1-39% ICA stenosis.  Vertebral artery flow is antegrade.  Elva Breaker, RVT 04/25/2013, 9:51 AM

## 2013-04-25 NOTE — Discharge Summary (Signed)
Physician Discharge Summary  Ricky Terry:[id number] DOB: Jan 20, 1946 DOA: 04/23/2013  PCP: Alva Garnet., MD  Admit date: 04/23/2013 Discharge date: 04/25/2013  Recommendations for Outpatient Follow-up:  1. Pt will need to follow up with PCP in 2-3 weeks post discharge 2. Please obtain BMP to evaluate electrolytes and kidney function 3. Please also check CBC to evaluate Hg and Hct levels 4. Please note that pt has an appointment to follow up with Dr. Pearlean Brownie, stroke specialist  5. Please note that pt has had A1C and lipid panel check piror to discharge but did not want to wait for the results, please discuss the results on follow up visit 6. There is questionable compliance with Metformin but A1C is not known and therefor this was not discussed as pt insisted on leaving before the results came back, may need Insulin if A1C is > 9-10 7. He was discharge on statin as well but lipid panel report has not been received prior to discharge    Discharge Diagnoses:  Principal Problem:   TIA (transient ischemic attack) Active Problems:   DM (diabetes mellitus)   HTN (hypertension)  Discharge Condition: Stable  Diet recommendation: Heart healthy diet discussed in details   Brief narrative:  67 y.o. 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 He didn't have any slurred speech nor facial droop. He denied palpitation, HA, lower extremity weakness. Symptoms resolved upon arrival to ER (not TPA candidate). He was admitted 2012, and at that time, coratid US showed higher velocity of the Right MCA and Right Basilar arteries of unclear clinical significant. He has claustrophobia and didn't agree to MRI at that time. He still refuses MRI at this time, despite being offered sedatives. He understand the limitation  of CT vs MRI and is still adamant about this. He denied tobacco or drug use, and said he has been compliant with ASA. EDP consulted [facility] and asked hospitalist to admit for further TIA work up.   Principal Problem:  TIA (transient ischemic attack)  - stroke noted in the right cuadte part of hte brain, pt made aware - neurology team foolowed nad provided recommendations - pt will be doischarged home as his sympotm now resolved  - he was started on statin and educated on compliance  - he was also advised to continue Lisinopril  - he was also advised to continue Plavix and aspirin  Active Problems:  DM (diabetes mellitus)  - metformin for now but needs to have follow up on A1C that was obtained prior to discharge  HTN (hypertension)  - continue Lisinopril and HCTZ  Consultants:  Neurology  Procedures/Studies:  Ct Angio Head W/cm &/or Wo Cm 04/24/2013 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 Moderate narrowing and irregularity of the cavernous segment of the right internal carotid artery. Mild narrowing and irregularity of the cavernous segment of the left internal carotid artery. The Mild narrowing M1 segment middle cerebral artery bilaterally. Mild narrowing right middle cerebral artery bifurcation. Mild narrowing and irregularity middle cerebral artery branch vessels bilaterally. Mild narrowing irregularity A1 segment right anterior cerebral artery. Mild narrowing A2 segment anterior cerebral artery bilaterally. Mild narrowing and irregularity of the basilar artery.  Ct Head Wo Contrast  04/24/2013 No acute intracranial abnormalities.  Antibiotics:  None   Code Status: Full  Family Communication: Pt at bedside       Discharge Exam: Filed Vitals:   04/25/13 0600  BP: 120/66  Pulse: 73  Temp: 97.4 F (36.3 C)  Resp: 18   Filed Vitals:   04/24/13 1740 04/24/13 2200 04/25/13 0200 04/25/13  0600  BP: 133/62 140/76 135/72 120/66  Pulse: 87 85 82 73  Temp: 97.6 F (36.4 C) 98 F (36.7 C) 98 F (36.7 C) 97.4 F (36.3 C)  TempSrc: Oral     Resp: 18 18 18 18   Height:      Weight:    71.4 kg (157 lb 6.5 oz)  SpO2: 100% 99% 100% 96%    General: Pt is alert, follows commands appropriately, not in acute distress Cardiovascular: Regular rate and rhythm, S1/S2 +, no murmurs, no rubs, no gallops Respiratory: Clear to auscultation bilaterally, no wheezing, no crackles, no rhonchi Abdominal: Soft, non tender, non distended, bowel sounds +, no guarding Extremities: no edema, no cyanosis, pulses palpable bilaterally DP and PT Neuro: Grossly nonfocal  Discharge Instructions  Discharge Orders   Future Orders Complete By Expires   Diet - low sodium heart healthy  As directed    Increase activity slowly  As directed        Medication List         aspirin 81 MG chewable tablet  Chew 81 mg by mouth daily.     atorvastatin 20 MG tablet  Commonly known as:  LIPITOR  Take 1 tablet (20 mg total) by mouth daily at 6 PM.     clopidogrel 75 MG tablet  Commonly known as:  PLAVIX  Take 1 tablet (75 mg total) by mouth daily with breakfast.     hydrochlorothiazide 25 MG tablet  Commonly known as:  HYDRODIURIL  Take 1 tablet (25 mg total) by mouth daily.     lisinopril 20 MG tablet  Commonly known as:  PRINIVIL,ZESTRIL  Take 0.5 tablets (10 mg total) by mouth daily.     meclizine 25 MG tablet  Commonly known as:  ANTIVERT  Take 1 tablet (25 mg total) by mouth 3 (three) times daily as needed for dizziness.     metFORMIN 1000 MG tablet  Commonly known as:  GLUCOPHAGE  Take 0.5 tablets (500 mg total) by mouth 2 (two) times daily with a meal.     naproxen 500 MG tablet  Commonly known as:  NAPROSYN  Take 1 tablet (500 mg total) by mouth 2 (two) times daily.           Follow-up Information   Follow up with Gates Rigg, MD In 2 months. (Call Monday or Tuesday for  an appointment in 2 months)    Specialties:  Neurology, Radiology   Contact information:   [address] [place] [address] [phone]       Follow up with Alva Garnet., MD In 2 weeks.   Specialty:  Internal Medicine   Contact information:   [address] [place] [address] [phone]       Follow up with Debbora Presto, MD. (As needed if symptoms worsen)    Specialty:  Internal Medicine   Contact information:   [address] [place] [address] [phone]        The results of significant diagnostics from this hospitalization (including imaging, microbiology, ancillary and laboratory) are listed below for reference.  Microbiology: No results found for this or any previous visit (from the past 240 hour(s)).   Labs: Basic Metabolic Panel:  Recent Labs Lab 04/23/13 2053 04/25/13 0535  NA 133* 131*  K 3.7 3.7  CL 100 97  CO2 23 25  GLUCOSE 304* 300*  BUN 17 16  CREATININE 0.73 0.77  CALCIUM 9.2 9.6   CBC:  Recent Labs Lab 04/23/13 2053 04/25/13 0535  WBC 6.0 5.8  NEUTROABS 3.3  --   HGB 12.5* 13.2  HCT 36.9* 38.4*  MCV 78.8 78.9  PLT 223 210   Cardiac Enzymes:  Recent Labs Lab 04/23/13 2053  TROPONINI <0.30   CBG:  Recent Labs Lab 04/24/13 1153 04/24/13 1706 04/24/13 2154 04/25/13 0706 04/25/13 1116  GLUCAP 236* 154* 349* 394* 376*     SIGNED: Time coordinating discharge: Over 30 minutes  Debbora Presto, MD  [facility] 04/25/2013, 12:53 PM Pager [phone]  If 7PM-7AM, please contact night-coverage [email or web address] Password [email or web address]

## 2013-06-10 ENCOUNTER — Ambulatory Visit (INDEPENDENT_AMBULATORY_CARE_PROVIDER_SITE_OTHER): Payer: Self-pay | Admitting: Physician Assistant

## 2013-06-10 VITALS — BP 138/76 | HR 100 | Temp 97.9°F | Resp 20 | Ht 64.25 in | Wt 154.8 lb

## 2013-06-10 DIAGNOSIS — Z7189 Other specified counseling: Secondary | ICD-10-CM

## 2013-06-10 NOTE — Progress Notes (Signed)
   [address] [place] [address]   Phone [phone]  Subjective:    Patient ID: Ricky Terry, male    DOB: 05/10/1946, 67 y.o.   MRN: [id number]  HPI Pt presents to clinic to be excused from jury duty.  He states that his Metformin makes him sleepy.  He has a PCP but he owes her money and she will not fill out the form for him.   Review of Systems     Objective:   Physical Exam        Assessment & Plan:  I d/w pt that Metformin does not typically cause drowsiness and that would not be a good excuse to miss jury duty and that I was unable to provide the excuse for the patient.  I refunded his money.

## 2013-09-15 ENCOUNTER — Ambulatory Visit: Payer: Medicare Other

## 2013-09-22 ENCOUNTER — Ambulatory Visit: Payer: Medicare Other

## 2013-09-29 ENCOUNTER — Ambulatory Visit: Payer: Medicare Other

## 2013-10-27 ENCOUNTER — Ambulatory Visit: Payer: Medicare Other

## 2013-11-03 ENCOUNTER — Ambulatory Visit: Payer: Medicare Other

## 2013-11-10 ENCOUNTER — Ambulatory Visit: Payer: Medicare Other

## 2013-12-09 ENCOUNTER — Other Ambulatory Visit (HOSPITAL_COMMUNITY): Payer: Self-pay | Admitting: Urology

## 2013-12-09 DIAGNOSIS — C61 Malignant neoplasm of prostate: Secondary | ICD-10-CM

## 2013-12-14 ENCOUNTER — Ambulatory Visit (HOSPITAL_COMMUNITY)
Admission: RE | Admit: 2013-12-14 | Discharge: 2013-12-14 | Disposition: A | Discharge status: Home / Self Care | Payer: Medicare Other | Source: Ambulatory Visit | Attending: Urology | Admitting: Urology

## 2013-12-14 ENCOUNTER — Encounter (HOSPITAL_COMMUNITY)
Admission: RE | Admit: 2013-12-14 | Discharge: 2013-12-14 | Disposition: A | Discharge status: Home / Self Care | Payer: Medicare Other | Source: Ambulatory Visit | Attending: Urology | Admitting: Urology

## 2013-12-14 DIAGNOSIS — C7952 Secondary malignant neoplasm of bone marrow: Secondary | ICD-10-CM

## 2013-12-14 DIAGNOSIS — C61 Malignant neoplasm of prostate: Secondary | ICD-10-CM | POA: Insufficient documentation

## 2013-12-14 DIAGNOSIS — C7951 Secondary malignant neoplasm of bone: Secondary | ICD-10-CM | POA: Insufficient documentation

## 2013-12-14 MED ORDER — TECHNETIUM TC 99M MEDRONATE IV KIT
25.8000 | PACK | Freq: Once | INTRAVENOUS | Status: AC | PRN
  Administered 2013-12-14: 25.8 via INTRAVENOUS

## 2014-02-12 ENCOUNTER — Emergency Department (HOSPITAL_COMMUNITY)
Admission: EM | Admit: 2014-02-12 | Discharge: 2014-02-13 | Disposition: A | Discharge status: Home / Self Care | Payer: Medicare Other | Attending: Emergency Medicine | Admitting: Emergency Medicine

## 2014-02-12 DIAGNOSIS — I10 Essential (primary) hypertension: Secondary | ICD-10-CM | POA: Insufficient documentation

## 2014-02-12 DIAGNOSIS — R42 Dizziness and giddiness: Secondary | ICD-10-CM | POA: Insufficient documentation

## 2014-02-12 DIAGNOSIS — R739 Hyperglycemia, unspecified: Secondary | ICD-10-CM

## 2014-02-12 DIAGNOSIS — Z8673 Personal history of transient ischemic attack (TIA), and cerebral infarction without residual deficits: Secondary | ICD-10-CM | POA: Insufficient documentation

## 2014-02-12 DIAGNOSIS — E119 Type 2 diabetes mellitus without complications: Secondary | ICD-10-CM | POA: Insufficient documentation

## 2014-02-12 DIAGNOSIS — Z79899 Other long term (current) drug therapy: Secondary | ICD-10-CM | POA: Insufficient documentation

## 2014-02-12 DIAGNOSIS — Z7902 Long term (current) use of antithrombotics/antiplatelets: Secondary | ICD-10-CM | POA: Insufficient documentation

## 2014-02-13 ENCOUNTER — Encounter (HOSPITAL_COMMUNITY): Payer: Self-pay | Admitting: Emergency Medicine

## 2014-02-13 LAB — I-STAT CHEM 8, ED
BUN: 16 mg/dL (ref 6–23)
CALCIUM ION: 1.18 mmol/L (ref 1.13–1.30)
CHLORIDE: 102 meq/L (ref 96–112)
Creatinine, Ser: 0.9 mg/dL (ref 0.50–1.35)
Glucose, Bld: 275 mg/dL — ABNORMAL HIGH (ref 70–99)
HEMATOCRIT: 40 % (ref 39.0–52.0)
Hemoglobin: 13.6 g/dL (ref 13.0–17.0)
Potassium: 5 mEq/L (ref 3.7–5.3)
Sodium: 137 mEq/L (ref 137–147)
TCO2: 23 mmol/L (ref 0–100)

## 2014-02-13 LAB — CBG MONITORING, ED: Glucose-Capillary: 269 mg/dL — ABNORMAL HIGH (ref 70–99)

## 2014-02-13 NOTE — ED Notes (Signed)
Gave Patient 240 ml of black coffee

## 2014-02-13 NOTE — ED Provider Notes (Signed)
CSN: [id number]     Arrival date & time 02/12/14  2253 History   First MD Initiated Contact with Patient 02/13/14 0128     Chief Complaint  Patient presents with  . Dizziness     (Consider location/radiation/quality/duration/timing/severity/associated sxs/prior Treatment) HPI Comments: 68 year old male with history of diabetes and high blood pressure presents after a transient episode of lightheadedness prior to arrival. Patient took his metformin as he normally does however did not check his blood sugar and fell lightheaded with standing which improved after lying flat. Patient has had this briefly in the past. Currently patient has no symptoms and "feels great ".  Patient denies chest pain, shortness of breath, vertigo symptoms, head injuries, blood in the stools or other new medication. Patient tolerating liquids through the day. No cardiac history.  Patient is a 68 y.o. male presenting with dizziness. The history is provided by the patient.  Dizziness Associated symptoms: no chest pain, no headaches, no shortness of breath and no vomiting     Past Medical History  Diagnosis Date  . Stroke   . Diabetes mellitus   . Hypertension    Past Surgical History  Procedure Laterality Date  . Eye surgery     Family History  Problem Relation Age of Onset  . Cancer Mother   . Hypertension Mother   . Hypertension Father   . Cancer Father   . Diabetes Sister   . Hypertension Sister   . Hypertension Brother   . Diabetes Brother    History  Substance Use Topics  . Smoking status: Never Smoker   . Smokeless tobacco: Not on file  . Alcohol Use: No    Review of Systems  Constitutional: Negative for fever and chills.  HENT: Negative for congestion.   Eyes: Negative for visual disturbance.  Respiratory: Negative for shortness of breath.   Cardiovascular: Negative for chest pain.  Gastrointestinal: Negative for vomiting and abdominal pain.  Genitourinary: Negative for dysuria and  flank pain.  Musculoskeletal: Negative for back pain, neck pain and neck stiffness.  Skin: Negative for rash.  Neurological: Positive for light-headedness. Negative for headaches.      Allergies  Review of patient's allergies indicates no known allergies.  Home Medications   Prior to Admission medications   Medication Sig Start Date End Date Taking? Authorizing Provider  acetaminophen (TYLENOL) 500 MG tablet Take 1,000 mg by mouth every 6 (six) hours as needed (for pain.).   Yes Historical Provider, MD  atorvastatin (LIPITOR) 40 MG tablet Take 40 mg by mouth at bedtime.   Yes Historical Provider, MD  cholecalciferol (VITAMIN D) 400 UNITS TABS tablet Take 400 Units by mouth daily.   Yes Historical Provider, MD  clopidogrel (PLAVIX) 75 MG tablet Take 1 tablet (75 mg total) by mouth daily with breakfast. 04/25/13  Yes Theodis Blaze, MD  losartan (COZAAR) 50 MG tablet Take 50 mg by mouth daily.   Yes Historical Provider, MD  metFORMIN (GLUCOPHAGE) 1000 MG tablet Take 1,000 mg by mouth 2 (two) times daily.   Yes Historical Provider, MD   BP 161/77  Pulse 88  Temp(Src) 97.5 F (36.4 C) (Oral)  Resp 20  Wt 165 lb (74.844 kg)  SpO2 100% Physical Exam  Nursing note and vitals reviewed. Constitutional: He is oriented to person, place, and time. He appears well-developed and well-nourished.  HENT:  Head: Normocephalic and atraumatic.  Eyes: Conjunctivae are normal. Right eye exhibits no discharge. Left eye exhibits no discharge.  Neck: Normal  range of motion. Neck supple. No tracheal deviation present.  Cardiovascular: Normal rate and regular rhythm.   Pulmonary/Chest: Effort normal and breath sounds normal.  Abdominal: Soft. He exhibits no distension. There is no tenderness. There is no guarding.  Musculoskeletal: He exhibits no edema.  Neurological: He is alert and oriented to person, place, and time.  5+ strength in UE and LE with f/e at major joints. Sensation to palpation intact in  UE and LE. CNs 2-12 grossly intact.  EOMFI.  PERRL.   Finger nose and coordination intact bilateral.   Visual fields intact to finger testing.   Skin: Skin is warm. No rash noted.  Psychiatric: He has a normal mood and affect.    ED Course  Procedures (including critical care time) Labs Review Labs Reviewed  CBG MONITORING, ED - Abnormal; Notable for the following:    Glucose-Capillary 269 (*)    All other components within normal limits  I-STAT CHEM 8, ED - Abnormal; Notable for the following:    Glucose, Bld 275 (*)    All other components within normal limits    Imaging Review No results found.   EKG Interpretation None      MDM   Final diagnoses:  Hyperglycemia  Lightheadedness   Well-appearing male early asymptomatic. Glucose mildly elevated on arrival however patient does not have symptoms of DKA. Discussed obtaining chem 8 to check hemoglobin and potassium. Followup outpatient discussed and oral fluids given.  Patient has dealt a gap of 2 in very well-appearing followup outpatient discussed. Results and differential diagnosis were discussed with the patient/parent/guardian. Close follow up outpatient was discussed, comfortable with the plan.   Medications - No data to display  Filed Vitals:   02/12/14 2356  BP: 161/77  Pulse: 88  Temp: 97.5 F (36.4 C)  TempSrc: Oral  Resp: 20  Weight: 165 lb (74.844 kg)  SpO2: 100%        Mariea Clonts, MD 02/13/14 [phone]

## 2014-02-13 NOTE — ED Notes (Addendum)
Pt c/o lightheadedness after taking medication and lying down, pt states he felt like his sugar was low but did not check sugar at home. A & O, NAD

## 2014-02-13 NOTE — Discharge Instructions (Signed)
Stay well hydrated. If you were given medicines take as directed.  If you are on coumadin or contraceptives realize their levels and effectiveness is altered by many different medicines.  If you have any reaction (rash, tongues swelling, other) to the medicines stop taking and see a physician.   Please follow up as directed and return to the ER or see a physician for new or worsening symptoms.  Thank you. Filed Vitals:   02/12/14 2356 02/13/14 0236  BP: 161/77 189/92  Pulse: 88 87  Temp: 97.5 F (36.4 C)   TempSrc: Oral   Resp: 20 16  Weight: 165 lb (74.844 kg)   SpO2: 100% 100%

## 2014-03-27 ENCOUNTER — Encounter (HOSPITAL_COMMUNITY): Payer: Self-pay | Admitting: Emergency Medicine

## 2014-03-27 ENCOUNTER — Emergency Department (HOSPITAL_COMMUNITY)
Admission: EM | Admit: 2014-03-27 | Discharge: 2014-03-27 | Disposition: A | Discharge status: Home / Self Care | Payer: Medicare Other | Attending: Emergency Medicine | Admitting: Emergency Medicine

## 2014-03-27 DIAGNOSIS — R5381 Other malaise: Secondary | ICD-10-CM | POA: Diagnosis present

## 2014-03-27 DIAGNOSIS — E119 Type 2 diabetes mellitus without complications: Secondary | ICD-10-CM | POA: Insufficient documentation

## 2014-03-27 DIAGNOSIS — I10 Essential (primary) hypertension: Secondary | ICD-10-CM | POA: Diagnosis not present

## 2014-03-27 DIAGNOSIS — R739 Hyperglycemia, unspecified: Secondary | ICD-10-CM

## 2014-03-27 DIAGNOSIS — E860 Dehydration: Secondary | ICD-10-CM | POA: Diagnosis not present

## 2014-03-27 DIAGNOSIS — R42 Dizziness and giddiness: Secondary | ICD-10-CM | POA: Diagnosis not present

## 2014-03-27 DIAGNOSIS — Z8673 Personal history of transient ischemic attack (TIA), and cerebral infarction without residual deficits: Secondary | ICD-10-CM | POA: Insufficient documentation

## 2014-03-27 DIAGNOSIS — Z79899 Other long term (current) drug therapy: Secondary | ICD-10-CM | POA: Insufficient documentation

## 2014-03-27 DIAGNOSIS — Z7902 Long term (current) use of antithrombotics/antiplatelets: Secondary | ICD-10-CM | POA: Diagnosis not present

## 2014-03-27 DIAGNOSIS — R5383 Other fatigue: Secondary | ICD-10-CM

## 2014-03-27 LAB — CBC WITH DIFFERENTIAL/PLATELET
BASOS PCT: 0 % (ref 0–1)
Basophils Absolute: 0 10*3/uL (ref 0.0–0.1)
Eosinophils Absolute: 0 10*3/uL (ref 0.0–0.7)
Eosinophils Relative: 0 % (ref 0–5)
HCT: 37.8 % — ABNORMAL LOW (ref 39.0–52.0)
Hemoglobin: 12.4 g/dL — ABNORMAL LOW (ref 13.0–17.0)
Lymphocytes Relative: 22 % (ref 12–46)
Lymphs Abs: 1.6 10*3/uL (ref 0.7–4.0)
MCH: 25.5 pg — ABNORMAL LOW (ref 26.0–34.0)
MCHC: 32.8 g/dL (ref 30.0–36.0)
MCV: 77.8 fL — ABNORMAL LOW (ref 78.0–100.0)
Monocytes Absolute: 0.4 10*3/uL (ref 0.1–1.0)
Monocytes Relative: 6 % (ref 3–12)
NEUTROS PCT: 72 % (ref 43–77)
Neutro Abs: 5 10*3/uL (ref 1.7–7.7)
PLATELETS: 273 10*3/uL (ref 150–400)
RBC: 4.86 MIL/uL (ref 4.22–5.81)
RDW: 14.2 % (ref 11.5–15.5)
WBC: 7.1 10*3/uL (ref 4.0–10.5)

## 2014-03-27 LAB — BASIC METABOLIC PANEL
ANION GAP: 18 — AB (ref 5–15)
BUN: 30 mg/dL — ABNORMAL HIGH (ref 6–23)
CO2: 22 mEq/L (ref 19–32)
Calcium: 10.8 mg/dL — ABNORMAL HIGH (ref 8.4–10.5)
Chloride: 98 mEq/L (ref 96–112)
Creatinine, Ser: 1.12 mg/dL (ref 0.50–1.35)
GFR, EST AFRICAN AMERICAN: 78 mL/min — AB (ref >90)
GFR, EST NON AFRICAN AMERICAN: 67 mL/min — AB (ref >90)
Glucose, Bld: 287 mg/dL — ABNORMAL HIGH (ref 70–99)
POTASSIUM: 5 meq/L (ref 3.7–5.3)
SODIUM: 138 meq/L (ref 137–147)

## 2014-03-27 LAB — CBG MONITORING, ED: GLUCOSE-CAPILLARY: 271 mg/dL — AB (ref 70–99)

## 2014-03-27 NOTE — ED Notes (Signed)
PA West at bedside  

## 2014-03-27 NOTE — ED Provider Notes (Signed)
CSN: [id number]     Arrival date & time 03/27/14  2016 History   First MD Initiated Contact with Patient 03/27/14 2043     Chief Complaint  Patient presents with  . Weakness     (Consider location/radiation/quality/duration/timing/severity/associated sxs/prior Treatment) HPI  Pt with hx DM, HTN, stroke, presents with episode of lightheadedness and weakness that occurred today while outside in the heat helping a friend change a tire. Felt that he almost passed out.  Has had 2 days of waking up sweating at night.  Has also had pressure without pain in his head.  Currently he feels "great" and is completely asymptomatic.  Started glipizide in addition to his normal medications 2 days ago.  Denies fevers, chills, body aches, focal neurologic deficits including focal weakness, CP, SOB cough, URI symptoms, abdominal pain, N/V/D, urinary symptoms, leg swelling.  Prior to his symptoms today he ate a cheeseburger, french fries, and drank a [employer] soda.    Past Medical History  Diagnosis Date  . Stroke   . Diabetes mellitus   . Hypertension    Past Surgical History  Procedure Laterality Date  . Eye surgery     Family History  Problem Relation Age of Onset  . Cancer Mother   . Hypertension Mother   . Hypertension Father   . Cancer Father   . Diabetes Sister   . Hypertension Sister   . Hypertension Brother   . Diabetes Brother    History  Substance Use Topics  . Smoking status: Never Smoker   . Smokeless tobacco: Not on file  . Alcohol Use: No    Review of Systems  All other systems reviewed and are negative.     Allergies  Review of patient's allergies indicates no known allergies.  Home Medications   Prior to Admission medications   Medication Sig Start Date End Date Taking? Authorizing Provider  acetaminophen (TYLENOL) 500 MG tablet Take 1,000 mg by mouth every 6 (six) hours as needed (for pain.).    Historical Provider, MD  atorvastatin (LIPITOR) 40 MG tablet Take 40 mg  by mouth at bedtime.    Historical Provider, MD  cholecalciferol (VITAMIN D) 400 UNITS TABS tablet Take 400 Units by mouth daily.    Historical Provider, MD  clopidogrel (PLAVIX) 75 MG tablet Take 1 tablet (75 mg total) by mouth daily with breakfast. 04/25/13   Theodis Blaze, MD  losartan (COZAAR) 50 MG tablet Take 50 mg by mouth daily.    Historical Provider, MD  metFORMIN (GLUCOPHAGE) 1000 MG tablet Take 1,000 mg by mouth 2 (two) times daily.    Historical Provider, MD   BP 139/65  Pulse 99  Temp(Src) 97.8 F (36.6 C) (Oral)  Resp 20  Wt 152 lb (68.947 kg)  SpO2 98% Physical Exam  Nursing note and vitals reviewed. Constitutional: He appears well-developed and well-nourished. No distress.  HENT:  Head: Normocephalic and atraumatic.  Eyes: Conjunctivae are normal.  Neck: Neck supple.  Cardiovascular: Normal rate and regular rhythm.   Pulmonary/Chest: Effort normal and breath sounds normal. No respiratory distress. He has no wheezes. He has no rales.  Abdominal: Soft. He exhibits no distension and no mass. There is no tenderness. There is no rebound and no guarding.  Neurological: He is alert. He exhibits normal muscle tone.  CN II-XII intact, EOMs intact, no pronator drift, grip strengths equal bilaterally; strength 5/5 in all extremities, sensation intact in all extremities; finger to nose, heel to shin, rapid alternating movements  normal; gait is normal.     Skin: He is not diaphoretic.  Psychiatric: He has a normal mood and affect. His behavior is normal. Thought content normal.    ED Course  Procedures (including critical care time) Labs Review Labs Reviewed  CBC WITH DIFFERENTIAL - Abnormal; Notable for the following:    Hemoglobin 12.4 (*)    HCT 37.8 (*)    MCV 77.8 (*)    MCH 25.5 (*)    All other components within normal limits  BASIC METABOLIC PANEL - Abnormal; Notable for the following:    Glucose, Bld 287 (*)    BUN 30 (*)    Calcium 10.8 (*)    GFR calc non Af  Amer 67 (*)    GFR calc Af Amer 78 (*)    Anion gap 18 (*)    All other components within normal limits  CBG MONITORING, ED - Abnormal; Notable for the following:    Glucose-Capillary 271 (*)    All other components within normal limits    Imaging Review No results found.   EKG Interpretation   Date/Time:  Sunday March 27 2014 22:00:41 EDT Ventricular Rate:  89 PR Interval:  141 QRS Duration: 76 QT Interval:  358 QTC Calculation: 436 R Axis:   51 Text Interpretation:  Sinus rhythm Abnormal T, consider ischemia, diffuse  leads No significant change since last tracing Confirmed by YAO  MD, DAVID  ([id number]) on 03/27/2014 10:15:40 PM      9:55 PM Dr Darl Householder made aware of the patient.   MDM   Final diagnoses:  Lightheadedness  Mild dehydration  Hyperglycemia   Afebrile, nontoxic patient who is currently asymptomatic presenting with episode of lightheadedness and generalized weakness today while working out in the heat in his church clothes.  Has also had two days of head pressure and sweating at night.  Started glipizide today.  Labs remarkable for hyperglycemia and dehydration.  Pt prefers and agrees to PO hydration. Discussed diabetic diet at length. D/C home.  PCP follow up.  Discussed result, findings, treatment, and follow up  with patient.  Pt given return precautions.  Pt verbalizes understanding and agrees with plan.        Clayton Bibles, PA-C 03/27/14 2249

## 2014-03-27 NOTE — ED Notes (Signed)
Pt arrived to the ED with a complaint of weakness in his leg and night sweats.  Pt states that he is a diabetic but only takes oral prescriptions.  Pt states Friday his CBG was 329.

## 2014-03-27 NOTE — Discharge Instructions (Signed)
Read the information below.  You may return to the Emergency Department at any time for worsening condition or any new symptoms that concern you.  Please drink plenty of water and rest.   Monitor your blood sugars closely and follow a diabetic diet.  If your symptoms continue, please discuss them with your primary care provider.  If you develop severe lightheadedness or dizziness, chest pain, weakness or numbness of your arms or legs, if you pass out or you have difficulty speaking or walking, return to the ER immediately for a recheck.     Dehydration, Adult Dehydration is when you lose more fluids from the body than you take in. Vital organs like the kidneys, brain, and heart cannot function without a proper amount of fluids and salt. Any loss of fluids from the body can cause dehydration.  CAUSES   Vomiting.  Diarrhea.  Excessive sweating.  Excessive urine output.  Fever. SYMPTOMS  Mild dehydration  Thirst.  Dry lips.  Slightly dry mouth. Moderate dehydration  Very dry mouth.  Sunken eyes.  Skin does not bounce back quickly when lightly pinched and released.  Dark urine and decreased urine production.  Decreased tear production.  Headache. Severe dehydration  Very dry mouth.  Extreme thirst.  Rapid, weak pulse (more than 100 beats per minute at rest).  Cold hands and feet.  Not able to sweat in spite of heat and temperature.  Rapid breathing.  Blue lips.  Confusion and lethargy.  Difficulty being awakened.  Minimal urine production.  No tears. DIAGNOSIS  Your caregiver will diagnose dehydration based on your symptoms and your exam. Blood and urine tests will help confirm the diagnosis. The diagnostic evaluation should also identify the cause of dehydration. TREATMENT  Treatment of mild or moderate dehydration can often be done at home by increasing the amount of fluids that you drink. It is best to drink small amounts of fluid more often. Drinking too  much at one time can make vomiting worse. Refer to the home care instructions below. Severe dehydration needs to be treated at the hospital where you will probably be given intravenous (IV) fluids that contain water and electrolytes. HOME CARE INSTRUCTIONS   Ask your caregiver about specific rehydration instructions.  Drink enough fluids to keep your urine clear or pale yellow.  Drink small amounts frequently if you have nausea and vomiting.  Eat as you normally do.  Avoid:  Foods or drinks high in sugar.  Carbonated drinks.  Juice.  Extremely hot or cold fluids.  Drinks with caffeine.  Fatty, greasy foods.  Alcohol.  Tobacco.  Overeating.  Gelatin desserts.  Wash your hands well to avoid spreading bacteria and viruses.  Only take over-the-counter or prescription medicines for pain, discomfort, or fever as directed by your caregiver.  Ask your caregiver if you should continue all prescribed and over-the-counter medicines.  Keep all follow-up appointments with your caregiver. SEEK MEDICAL CARE IF:  You have abdominal pain and it increases or stays in one area (localizes).  You have a rash, stiff neck, or severe headache.  You are irritable, sleepy, or difficult to awaken.  You are weak, dizzy, or extremely thirsty. SEEK IMMEDIATE MEDICAL CARE IF:   You are unable to keep fluids down or you get worse despite treatment.  You have frequent episodes of vomiting or diarrhea.  You have blood or green matter (bile) in your vomit.  You have blood in your stool or your stool looks black and tarry.  You  have not urinated in 6 to 8 hours, or you have only urinated a small amount of very dark urine.  You have a fever.  You faint. MAKE SURE YOU:   Understand these instructions.  Will watch your condition.  Will get help right away if you are not doing well or get worse. Document Released: 08/05/2005 Document Revised: 10/28/2011 Document Reviewed:  03/25/2011 [facility] Patient Information 2015 [place], [place]. This information is not intended to replace advice given to you by your health care provider. Make sure you discuss any questions you have with your health care provider.  Blood Glucose Monitoring Monitoring your blood glucose (also know as blood sugar) helps you to manage your diabetes. It also helps you and your health care provider monitor your diabetes and determine how well your treatment plan is working. WHY SHOULD YOU MONITOR YOUR BLOOD GLUCOSE?  It can help you understand how food, exercise, and medicine affect your blood glucose.  It allows you to know what your blood glucose is at any given moment. You can quickly tell if you are having low blood glucose (hypoglycemia) or high blood glucose (hyperglycemia).  It can help you and your health care provider know how to adjust your medicines.  It can help you understand how to manage an illness or adjust medicine for exercise. WHEN SHOULD YOU TEST? Your health care provider will help you decide how often you should check your blood glucose. This may depend on the type of diabetes you have, your diabetes control, or the types of medicines you are taking. Be sure to write down all of your blood glucose readings so that this information can be reviewed with your health care provider. See below for examples of testing times that your health care provider may suggest. Type 1 Diabetes  Test 4 times a day if you are in good control, using an insulin pump, or perform multiple daily injections.  If your diabetes is not well controlled or if you are sick, you may need to monitor more often.  It is a good idea to also monitor:  Before and after exercise.  Between meals and 2 hours after a meal.  Occasionally between 2:00 a.m. and 3:00 a.m. Type 2 Diabetes  It can vary with each person, but generally, if you are on insulin, test 4 times a day.  If you take medicines by mouth  (orally), test 2 times a day.  If you are on a controlled diet, test once a day.  If your diabetes is not well controlled or if you are sick, you may need to monitor more often. HOW TO MONITOR YOUR BLOOD GLUCOSE Supplies Needed  Blood glucose meter.  Test strips for your meter. Each meter has its own strips. You must use the strips that go with your own meter.  A pricking needle (lancet).  A device that holds the lancet (lancing device).  A journal or log book to write down your results. Procedure  Wash your hands with soap and water. Alcohol is not preferred.  Prick the side of your finger (not the tip) with the lancet.  Gently milk the finger until a small drop of blood appears.  Follow the instructions that come with your meter for inserting the test strip, applying blood to the strip, and using your blood glucose meter. Other Areas to Get Blood for Testing Some meters allow you to use other areas of your body (other than your finger) to test your blood. These areas are  called alternative sites. The most common alternative sites are:  The forearm.  The thigh.  The back area of the lower leg.  The palm of the hand. The blood flow in these areas is slower. Therefore, the blood glucose values you get may be delayed, and the numbers are different from what you would get from your fingers. Do not use alternative sites if you think you are having hypoglycemia. Your reading will not be accurate. Always use a finger if you are having hypoglycemia. Also, if you cannot feel your lows (hypoglycemia unawareness), always use your fingers for your blood glucose checks. ADDITIONAL TIPS FOR GLUCOSE MONITORING  Do not reuse lancets.  Always carry your supplies with you.  All blood glucose meters have a 24-hour "hotline" number to call if you have questions or need help.  Adjust (calibrate) your blood glucose meter with a control solution after finishing a few boxes of strips. BLOOD  GLUCOSE RECORD KEEPING It is a good idea to keep a daily record or log of your blood glucose readings. Most glucose meters, if not all, keep your glucose records stored in the meter. Some meters come with the ability to download your records to your home computer. Keeping a record of your blood glucose readings is especially helpful if you are wanting to look for patterns. Make notes to go along with the blood glucose readings because you might forget what happened at that exact time. Keeping good records helps you and your health care provider to work together to achieve good diabetes management.  Document Released: 08/08/2003 Document Revised: 12/20/2013 Document Reviewed: 12/28/2012 [facility] Patient Information 2015 [place], [place]. This information is not intended to replace advice given to you by your health care provider. Make sure you discuss any questions you have with your health care provider.  Near-Syncope Near-syncope (commonly known as near fainting) is sudden weakness, dizziness, or feeling like you might pass out. During an episode of near-syncope, you may also develop pale skin, have tunnel vision, or feel sick to your stomach (nauseous). Near-syncope may occur when getting up after sitting or while standing for a long time. It is caused by a sudden decrease in blood flow to the brain. This decrease can result from various causes or triggers, most of which are not serious. However, because near-syncope can sometimes be a sign of something serious, a medical evaluation is required. The specific cause is often not determined. HOME CARE INSTRUCTIONS  Monitor your condition for any changes. The following actions may help to alleviate any discomfort you are experiencing:  Have someone stay with you until you feel stable.  Lie down right away and prop your feet up if you start feeling like you might faint. Breathe deeply and steadily. Wait until all the symptoms have passed. Most of these  episodes last only a few minutes. You may feel tired for several hours.   Drink enough fluids to keep your urine clear or pale yellow.   If you are taking blood pressure or heart medicine, get up slowly when seated or lying down. Take several minutes to sit and then stand. This can reduce dizziness.  Follow up with your health care provider as directed. SEEK IMMEDIATE MEDICAL CARE IF:   You have a severe headache.   You have unusual pain in the chest, abdomen, or back.   You are bleeding from the mouth or rectum, or you have black or tarry stool.   You have an irregular or very fast heartbeat.  You have repeated fainting or have seizure-like jerking during an episode.   You faint when sitting or lying down.   You have confusion.   You have difficulty walking.   You have severe weakness.   You have vision problems.  MAKE SURE YOU:   Understand these instructions.  Will watch your condition.  Will get help right away if you are not doing well or get worse. Document Released: 08/05/2005 Document Revised: 08/10/2013 Document Reviewed: 01/08/2013 [facility] Patient Information 2015 [place], [place]. This information is not intended to replace advice given to you by your health care provider. Make sure you discuss any questions you have with your health care provider.

## 2014-03-28 NOTE — ED Provider Notes (Signed)
Medical screening examination/treatment/procedure(s) were conducted as a shared visit with non-physician practitioner(s) and myself.  I personally evaluated the patient during the encounter.   EKG Interpretation   Date/Time:  Sunday March 27 2014 22:00:41 EDT Ventricular Rate:  89 PR Interval:  141 QRS Duration: 76 QT Interval:  358 QTC Calculation: 436 R Axis:   51 Text Interpretation:  Sinus rhythm Abnormal T, consider ischemia, diffuse  leads No significant change since last tracing Confirmed by Cherae Marton  MD, Faiz Weber  ([id number]) on 03/27/2014 10:15:40 PM      Ricky Terry is a 68 y.o. male hx of DM, HTN, stroke here with weakness, lightheadedness. He was out in the heat today and didn't drink much fluid. He then felt light headed and dizzy. No actual syncope, no chest pain and no hx of CAD. Labs showed mild hyperglycemia but patient not in DKA. Appears slightly dehydrated but was able to drink fluids. EKG unchanged. I told him to stay hydrated at home.    Wandra Arthurs, MD 03/28/14 [id number]

## 2014-12-19 ENCOUNTER — Emergency Department (HOSPITAL_COMMUNITY)
Admission: EM | Admit: 2014-12-19 | Discharge: 2014-12-19 | Disposition: A | Discharge status: Home / Self Care | Payer: PPO | Attending: Emergency Medicine | Admitting: Emergency Medicine

## 2014-12-19 ENCOUNTER — Encounter (HOSPITAL_COMMUNITY): Payer: Self-pay | Admitting: Emergency Medicine

## 2014-12-19 ENCOUNTER — Emergency Department (HOSPITAL_COMMUNITY): Payer: PPO

## 2014-12-19 DIAGNOSIS — E119 Type 2 diabetes mellitus without complications: Secondary | ICD-10-CM | POA: Insufficient documentation

## 2014-12-19 DIAGNOSIS — Z7982 Long term (current) use of aspirin: Secondary | ICD-10-CM | POA: Insufficient documentation

## 2014-12-19 DIAGNOSIS — M549 Dorsalgia, unspecified: Secondary | ICD-10-CM | POA: Diagnosis present

## 2014-12-19 DIAGNOSIS — Z79899 Other long term (current) drug therapy: Secondary | ICD-10-CM | POA: Diagnosis not present

## 2014-12-19 DIAGNOSIS — I10 Essential (primary) hypertension: Secondary | ICD-10-CM | POA: Insufficient documentation

## 2014-12-19 DIAGNOSIS — Z7902 Long term (current) use of antithrombotics/antiplatelets: Secondary | ICD-10-CM | POA: Insufficient documentation

## 2014-12-19 DIAGNOSIS — Z8673 Personal history of transient ischemic attack (TIA), and cerebral infarction without residual deficits: Secondary | ICD-10-CM | POA: Diagnosis not present

## 2014-12-19 DIAGNOSIS — M546 Pain in thoracic spine: Secondary | ICD-10-CM | POA: Diagnosis not present

## 2014-12-19 MED ORDER — HYDROCODONE-ACETAMINOPHEN 5-325 MG PO TABS: 1.0000 | ORAL_TABLET | Freq: Four times a day (QID) | ORAL | Status: DC | PRN

## 2014-12-19 NOTE — ED Provider Notes (Signed)
CSN: [id number]     Arrival date & time 12/19/14  1553 History  This chart was scribed for non-physician practitioner, Dahlia Bailiff, PA-C,working with Jola Schmidt, MD, by Marlowe Kays, ED Scribe. This patient was seen in room WTR6/WTR6 and the patient's care was started at 4:43 PM.  Chief Complaint  Patient presents with  . Back Pain   Patient is a 69 y.o. male presenting with back pain. The history is provided by the patient and medical records. No language interpreter was used.  Back Pain   HPI Comments:  Ricky Terry is a 69 y.o. male who presents to the Emergency Department complaining of moderate soreness of the right-sided mid back that began one week ago. He states he was lifting heavy objects recently and states it feels like it may be a pulled muscle. Pt reports that he has had shingles in the past and this pain does not feel anything like that. Patient reports increasing pain with range of motion. Lying and touching the area makes the pain worse. Deep breathing seems to exacerbate the pain as well. He denies any alleviating factors and has not taken anything to treat the pain. Ambulatory without issue. Denies rash, nausea, vomiting, numbness, weakness, tingling of the lower extremities, back pain, saddle anesthesia, trauma or fall. PMHx of CVA, DM and HTN.  Past Medical History  Diagnosis Date  . Stroke   . Diabetes mellitus   . Hypertension    Past Surgical History  Procedure Laterality Date  . Eye surgery     Family History  Problem Relation Age of Onset  . Cancer Mother   . Hypertension Mother   . Hypertension Father   . Cancer Father   . Diabetes Sister   . Hypertension Sister   . Hypertension Brother   . Diabetes Brother    History  Substance Use Topics  . Smoking status: Never Smoker   . Smokeless tobacco: Not on file  . Alcohol Use: No    Review of Systems  Musculoskeletal: Positive for back pain.    Allergies  Review of patient's allergies indicates  no known allergies.  Home Medications   Prior to Admission medications   Medication Sig Start Date End Date Taking? Authorizing Provider  acetaminophen (TYLENOL) 500 MG tablet Take 1,000 mg by mouth every 6 (six) hours as needed (for pain.).    Historical Provider, MD  aspirin 81 MG chewable tablet Chew 81 mg by mouth daily.   Yes Historical Provider, MD  atorvastatin (LIPITOR) 40 MG tablet Take 40 mg by mouth at bedtime.   Yes Historical Provider, MD  cholecalciferol (VITAMIN D) 400 UNITS TABS tablet Take 400 Units by mouth daily.   Yes Historical Provider, MD  clopidogrel (PLAVIX) 75 MG tablet Take 1 tablet (75 mg total) by mouth daily with breakfast. 04/25/13  Yes Theodis Blaze, MD  glipiZIDE (GLUCOTROL) 10 MG tablet Take 10 mg by mouth daily before breakfast.   Yes Historical Provider, MD  HYDROcodone-acetaminophen (NORCO/VICODIN) 5-325 MG per tablet Take 1-2 tablets by mouth every 6 (six) hours as needed. 12/19/14   Dahlia Bailiff, PA-C  losartan (COZAAR) 50 MG tablet Take 50 mg by mouth daily.   Yes Historical Provider, MD  metFORMIN (GLUCOPHAGE) 1000 MG tablet Take 1,000 mg by mouth 2 (two) times daily.   Yes Historical Provider, MD   Triage Vitals: BP 140/73 mmHg  Pulse 101  Temp(Src) 98.1 F (36.7 C) (Oral)  Resp 17  SpO2 99% Physical Exam  Constitutional: He is oriented to person, place, and time. He appears well-developed and well-nourished. No distress.  HENT:  Head: Normocephalic and atraumatic.  Mouth/Throat: Oropharynx is clear and moist. No oropharyngeal exudate.  Eyes: EOM are normal. Right eye exhibits no discharge. Left eye exhibits no discharge. No scleral icterus.  Neck: Normal range of motion.  Cardiovascular: Normal rate, regular rhythm and normal heart sounds.   No murmur heard. Pulmonary/Chest: Effort normal and breath sounds normal. No respiratory distress.    Abdominal: Soft. There is no tenderness.  Musculoskeletal: Normal range of motion. He exhibits no edema or  tenderness.       Back:  Neurological: He is alert and oriented to person, place, and time. He has normal strength. No cranial nerve deficit or sensory deficit. He displays a negative Romberg sign. Coordination and gait normal. GCS eye subscore is 4. GCS verbal subscore is 5. GCS motor subscore is 6.  Patient fully alert, answering questions appropriately in full, clear sentences. Cranial nerves II through XII grossly intact. Motor strength 5 out of 5 in all major muscle groups of upper and lower extremities. Distal sensation intact.   Skin: Skin is warm and dry. No rash noted. He is not diaphoretic.  Psychiatric: He has a normal mood and affect. His behavior is normal.  Nursing note and vitals reviewed.   ED Course  Procedures (including critical care time) DIAGNOSTIC STUDIES: Oxygen Saturation is 99% on RA, normal by my interpretation.   COORDINATION OF CARE: 4:49 PM- Will speak with Dr. Venora Maples about appropriate course of treatment. Pt verbalizes understanding and agrees to plan.  Medications - No data to display  Labs Review Labs Reviewed - No data to display  Imaging Review Dg Chest 2 View  12/19/2014   CLINICAL DATA:  Back pain.  RIGHT-sided lower chest pain.  EXAM: CHEST  2 VIEW  COMPARISON:  06/23/2012.  FINDINGS: Cardiopericardial silhouette within normal limits. Mediastinal contours normal. Trachea midline. No airspace disease or effusion.  IMPRESSION: No active cardiopulmonary disease.   Electronically Signed   By: Dereck Ligas M.D.   On: 12/19/2014 17:58     EKG Interpretation None      MDM   Final diagnoses:  Right-sided thoracic back pain    Patient with back pain consistent with a musculoskeletal back pain. Patient does have some description of a cough for the past 4 days on secondary evaluation, will follow up with chest radiographs.  Chest x-ray without evidence of acute pathology.  Patient is well-appearing, afebrile, hemodynamically stable and in no acute  distress. No neurological deficits and normal neuro exam.  Patient can walk but states is painful.  No loss of bowel or bladder control.  No concern for cauda equina.  No fever, night sweats, weight loss, h/o cancer, IVDU.  RICE protocol and pain medicine indicated and discussed with patient. Strongly encouraged patient follow up with his primary care provider and discussed return precautions with patient. Patient verbalizes understanding agreement this plan. Encouraged patient to call or return to ER if any worsening symptoms or should he have any questions or concerns.  I personally performed the services described in this documentation, which was scribed in my presence. The recorded information has been reviewed and is accurate.  BP 145/67 mmHg  Pulse 101  Temp(Src) 98.1 F (36.7 C) (Oral)  Resp 18  SpO2 100%  Signed,  Dahlia Bailiff, PA-C 9:59 PM  Patient seen and discussed with Dr. Jola Schmidt, M.D.  Dahlia Bailiff, PA-C  12/19/14 2159  Jola Schmidt, MD 12/20/14 [phone]

## 2014-12-19 NOTE — ED Notes (Addendum)
Pt c/o R mid back pain and side pain since last Monday. Pt denies injury but sts that he was lifting heavy "things" at work when he first noticed the pain. Pt denies abdominal pain, Urinary changes, N/V/D, chest pain, SOB. Pt A&Ox4 and ambulatory. Pt c/o tenderness to palpation of back and rib cage.

## 2014-12-19 NOTE — Discharge Instructions (Signed)
Back Pain, Adult °Low back pain is very common. About 1 in 5 people have back pain. The cause of low back pain is rarely dangerous. The pain often gets better over time. About half of people with a sudden onset of back pain feel better in just 2 weeks. About 8 in 10 people feel better by 6 weeks.  °CAUSES °Some common causes of back pain include: °· Strain of the muscles or ligaments supporting the spine. °· Wear and tear (degeneration) of the spinal discs. °· Arthritis. °· Direct injury to the back. °DIAGNOSIS °Most of the time, the direct cause of low back pain is not known. However, back pain can be treated effectively even when the exact cause of the pain is unknown. Answering your caregiver's questions about your overall health and symptoms is one of the most accurate ways to make sure the cause of your pain is not dangerous. If your caregiver needs more information, he or she may order lab work or imaging tests (X-rays or MRIs). However, even if imaging tests show changes in your back, this usually does not require surgery. °HOME CARE INSTRUCTIONS °For many people, back pain returns. Since low back pain is rarely dangerous, it is often a condition that people can learn to manage on their own.  °· Remain active. It is stressful on the back to sit or stand in one place. Do not sit, drive, or stand in one place for more than 30 minutes at a time. Take short walks on level surfaces as soon as pain allows. Try to increase the length of time you walk each day. °· Do not stay in bed. Resting more than 1 or 2 days can delay your recovery. °· Do not avoid exercise or work. Your body is made to move. It is not dangerous to be active, even though your back may hurt. Your back will likely heal faster if you return to being active before your pain is gone. °· Pay attention to your body when you  bend and lift. Many people have less discomfort when lifting if they bend their knees, keep the load close to their bodies, and  avoid twisting. Often, the most comfortable positions are those that put less stress on your recovering back. °· Find a comfortable position to sleep. Use a firm mattress and lie on your side with your knees slightly bent. If you lie on your back, put a pillow under your knees. °· Only take over-the-counter or prescription medicines as directed by your caregiver. Over-the-counter medicines to reduce pain and inflammation are often the most helpful. Your caregiver may prescribe muscle relaxant drugs. These medicines help dull your pain so you can more quickly return to your normal activities and healthy exercise. °· Put ice on the injured area. °¨ Put ice in a plastic bag. °¨ Place a towel between your skin and the bag. °¨ Leave the ice on for 15-20 minutes, 03-04 times a day for the first 2 to 3 days. After that, ice and heat may be alternated to reduce pain and spasms. °· Ask your caregiver about trying back exercises and gentle massage. This may be of some benefit. °· Avoid feeling anxious or stressed. Stress increases muscle tension and can worsen back pain. It is important to recognize when you are anxious or stressed and learn ways to manage it. Exercise is a great option. °SEEK MEDICAL CARE IF: °· You have pain that is not relieved with rest or medicine. °· You have pain that does not improve in 1 week. °· You have new symptoms. °· You are generally not feeling well. °SEEK   IMMEDIATE MEDICAL CARE IF:   You have pain that radiates from your back into your legs.  You develop new bowel or bladder control problems.  You have unusual weakness or numbness in your arms or legs.  You develop nausea or vomiting.  You develop abdominal pain.  You feel faint. Document Released: 08/05/2005 Document Revised: 02/04/2012 Document Reviewed: 12/07/2013 [facility] Patient Information 2015 [place], [place]. This information is not intended to replace advice given to you by your health care provider. Make sure you  discuss any questions you have with your health care provider.   Back Exercises Back exercises help treat and prevent back injuries. The goal of back exercises is to increase the strength of your abdominal and back muscles and the flexibility of your back. These exercises should be started when you no longer have back pain. Back exercises include:  Pelvic Tilt. Lie on your back with your knees bent. Tilt your pelvis until the lower part of your back is against the floor. Hold this position 5 to 10 sec and repeat 5 to 10 times.  Knee to Chest. Pull first 1 knee up against your chest and hold for 20 to 30 seconds, repeat this with the other knee, and then both knees. This may be done with the other leg straight or bent, whichever feels better.  Sit-Ups or Curl-Ups. Bend your knees 90 degrees. Start with tilting your pelvis, and do a partial, slow sit-up, lifting your trunk only 30 to 45 degrees off the floor. Take at least 2 to 3 seconds for each sit-up. Do not do sit-ups with your knees out straight. If partial sit-ups are difficult, simply do the above but with only tightening your abdominal muscles and holding it as directed.  Hip-Lift. Lie on your back with your knees flexed 90 degrees. Push down with your feet and shoulders as you raise your hips a couple inches off the floor; hold for 10 seconds, repeat 5 to 10 times.  Back arches. Lie on your stomach, propping yourself up on bent elbows. Slowly press on your hands, causing an arch in your low back. Repeat 3 to 5 times. Any initial stiffness and discomfort should lessen with repetition over time.  Shoulder-Lifts. Lie face down with arms beside your body. Keep hips and torso pressed to floor as you slowly lift your head and shoulders off the floor. Do not overdo your exercises, especially in the beginning. Exercises may cause you some mild back discomfort which lasts for a few minutes; however, if the pain is more severe, or lasts for more than  15 minutes, do not continue exercises until you see your caregiver. Improvement with exercise therapy for back problems is slow.  See your caregivers for assistance with developing a proper back exercise program. Document Released: 09/12/2004 Document Revised: 10/28/2011 Document Reviewed: 06/06/2011 [facility] Patient Information 2015 [place], [place]. This information is not intended to replace advice given to you by your health care provider. Make sure you discuss any questions you have with your health care provider.

## 2015-02-09 ENCOUNTER — Encounter (HOSPITAL_COMMUNITY): Payer: Self-pay | Admitting: *Deleted

## 2015-02-09 ENCOUNTER — Emergency Department (HOSPITAL_COMMUNITY)
Admission: EM | Admit: 2015-02-09 | Discharge: 2015-02-09 | Disposition: A | Discharge status: Home / Self Care | Payer: PPO | Attending: Emergency Medicine | Admitting: Emergency Medicine

## 2015-02-09 ENCOUNTER — Emergency Department (HOSPITAL_COMMUNITY): Payer: PPO

## 2015-02-09 DIAGNOSIS — M545 Low back pain: Secondary | ICD-10-CM | POA: Insufficient documentation

## 2015-02-09 DIAGNOSIS — Z7982 Long term (current) use of aspirin: Secondary | ICD-10-CM | POA: Diagnosis not present

## 2015-02-09 DIAGNOSIS — Z79899 Other long term (current) drug therapy: Secondary | ICD-10-CM | POA: Diagnosis not present

## 2015-02-09 DIAGNOSIS — R0781 Pleurodynia: Secondary | ICD-10-CM | POA: Insufficient documentation

## 2015-02-09 DIAGNOSIS — R109 Unspecified abdominal pain: Secondary | ICD-10-CM | POA: Diagnosis present

## 2015-02-09 DIAGNOSIS — C61 Malignant neoplasm of prostate: Secondary | ICD-10-CM | POA: Diagnosis not present

## 2015-02-09 DIAGNOSIS — Z7902 Long term (current) use of antithrombotics/antiplatelets: Secondary | ICD-10-CM | POA: Diagnosis not present

## 2015-02-09 DIAGNOSIS — I10 Essential (primary) hypertension: Secondary | ICD-10-CM | POA: Insufficient documentation

## 2015-02-09 DIAGNOSIS — M546 Pain in thoracic spine: Secondary | ICD-10-CM | POA: Diagnosis not present

## 2015-02-09 DIAGNOSIS — Z8673 Personal history of transient ischemic attack (TIA), and cerebral infarction without residual deficits: Secondary | ICD-10-CM | POA: Insufficient documentation

## 2015-02-09 DIAGNOSIS — M549 Dorsalgia, unspecified: Secondary | ICD-10-CM

## 2015-02-09 DIAGNOSIS — E119 Type 2 diabetes mellitus without complications: Secondary | ICD-10-CM | POA: Insufficient documentation

## 2015-02-09 LAB — COMPREHENSIVE METABOLIC PANEL
ALK PHOS: 322 U/L — AB (ref 38–126)
ALT: 18 U/L (ref 17–63)
AST: 20 U/L (ref 15–41)
Albumin: 3.4 g/dL — ABNORMAL LOW (ref 3.5–5.0)
Anion gap: 11 (ref 5–15)
BUN: 14 mg/dL (ref 6–20)
CALCIUM: 9.5 mg/dL (ref 8.9–10.3)
CO2: 25 mmol/L (ref 22–32)
Chloride: 98 mmol/L — ABNORMAL LOW (ref 101–111)
Creatinine, Ser: 0.65 mg/dL (ref 0.61–1.24)
GFR calc non Af Amer: >60 mL/min (ref >60)
Glucose, Bld: 354 mg/dL — ABNORMAL HIGH (ref 65–99)
Potassium: 4.4 mmol/L (ref 3.5–5.1)
SODIUM: 134 mmol/L — AB (ref 135–145)
TOTAL PROTEIN: 6.5 g/dL (ref 6.5–8.1)
Total Bilirubin: 0.3 mg/dL (ref 0.3–1.2)

## 2015-02-09 LAB — CBC WITH DIFFERENTIAL/PLATELET
Basophils Absolute: 0 10*3/uL (ref 0.0–0.1)
Basophils Relative: 1 % (ref 0–1)
Eosinophils Absolute: 0.1 10*3/uL (ref 0.0–0.7)
Eosinophils Relative: 1 % (ref 0–5)
HCT: 31.4 % — ABNORMAL LOW (ref 39.0–52.0)
Hemoglobin: 10.2 g/dL — ABNORMAL LOW (ref 13.0–17.0)
LYMPHS ABS: 1.4 10*3/uL (ref 0.7–4.0)
Lymphocytes Relative: 33 % (ref 12–46)
MCH: 25.9 pg — ABNORMAL LOW (ref 26.0–34.0)
MCHC: 32.5 g/dL (ref 30.0–36.0)
MCV: 79.7 fL (ref 78.0–100.0)
Monocytes Absolute: 0.3 10*3/uL (ref 0.1–1.0)
Monocytes Relative: 7 % (ref 3–12)
NEUTROS PCT: 58 % (ref 43–77)
Neutro Abs: 2.6 10*3/uL (ref 1.7–7.7)
Platelets: 220 10*3/uL (ref 150–400)
RBC: 3.94 MIL/uL — AB (ref 4.22–5.81)
RDW: 13.2 % (ref 11.5–15.5)
WBC: 4.4 10*3/uL (ref 4.0–10.5)

## 2015-02-09 LAB — URINALYSIS, ROUTINE W REFLEX MICROSCOPIC
Bilirubin Urine: NEGATIVE
HGB URINE DIPSTICK: NEGATIVE
KETONES UR: NEGATIVE mg/dL
Leukocytes, UA: NEGATIVE
Nitrite: NEGATIVE
PH: 5 (ref 5.0–8.0)
Protein, ur: NEGATIVE mg/dL
SPECIFIC GRAVITY, URINE: 1.025 (ref 1.005–1.030)
Urobilinogen, UA: 0.2 mg/dL (ref 0.0–1.0)

## 2015-02-09 LAB — LIPASE, BLOOD: Lipase: 17 U/L — ABNORMAL LOW (ref 22–51)

## 2015-02-09 LAB — URINE MICROSCOPIC-ADD ON

## 2015-02-09 MED ORDER — DIAZEPAM 5 MG PO TABS: 5.0000 mg | ORAL_TABLET | Freq: Two times a day (BID) | ORAL | Status: DC | PRN

## 2015-02-09 MED ORDER — KETOROLAC TROMETHAMINE 60 MG/2ML IM SOLN
30.0000 mg | Freq: Once | INTRAMUSCULAR | Status: AC
  Administered 2015-02-09: 30 mg via INTRAMUSCULAR
  Filled 2015-02-09: qty 2

## 2015-02-09 NOTE — Discharge Instructions (Signed)
Please follow-up closely with your oncologist for further evaluation of pain. Take Valium as needed for muscle cramp tightness. Return to ER if the condition worsened. Your blood count is low today and you would need to be rechecked. Your blood sugar is high, make sure you take your medication as prescribed.

## 2015-02-09 NOTE — ED Notes (Signed)
Pt states that he has had rt sided flank pain for 3 months; pt states that the pain has gotten progressively worse; pt states that he has been diagnosed with Cancer but unsure of what type; pt c/o urinary frequency; pt denies pain upon urination

## 2015-02-09 NOTE — ED Notes (Signed)
Patient unable to void at this time

## 2015-02-09 NOTE — ED Provider Notes (Signed)
CSN: [id number]     Arrival date & time 02/09/15  0532 History   First MD Initiated Contact with Patient 02/09/15 [phone]     Chief Complaint  Patient presents with  . Flank Pain     (Consider location/radiation/quality/duration/timing/severity/associated sxs/prior Treatment) HPI  69 year old male history of diabetes, hypertension, prior stroke presenting for evaluation of right flank pain. Patient reports constant sharp achy pain to his right flank starting from his right armpit and radiates to his mid lower back. Pain is been ongoing for the past 3 months, with normal intensity but has been Progressively Worse. He Was Diagnosed with Prostate Cancer Last Year and Has Received Injection Every 4 Month from His Cancer Doctor. Also admits to having unexplained weight loss from 164 lbs to 152lbs in a span of several months. Otherwise patient denies having fever, chills, severe headache, chest pain, shortness of breath, productive cough, hemoptysis, abdominal pain, dysuria, hematuria, rectal pain, or abnormal bowel movement. No recent strenuous activities. He denies having any rash.  Past Medical History  Diagnosis Date  . Stroke   . Diabetes mellitus   . Hypertension   . Cancer    Past Surgical History  Procedure Laterality Date  . Eye surgery     Family History  Problem Relation Age of Onset  . Cancer Mother   . Hypertension Mother   . Hypertension Father   . Cancer Father   . Diabetes Sister   . Hypertension Sister   . Hypertension Brother   . Diabetes Brother    History  Substance Use Topics  . Smoking status: Never Smoker   . Smokeless tobacco: Not on file  . Alcohol Use: No    Review of Systems  All other systems reviewed and are negative.     Allergies  Review of patient's allergies indicates no known allergies.  Home Medications   Prior to Admission medications   Medication Sig Start Date End Date Taking? Authorizing Provider  acetaminophen (TYLENOL) 500 MG  tablet Take 1,000 mg by mouth every 6 (six) hours as needed (for pain.).   Yes Historical Provider, MD  aspirin 81 MG chewable tablet Chew 81 mg by mouth daily.   Yes Historical Provider, MD  atorvastatin (LIPITOR) 40 MG tablet Take 40 mg by mouth at bedtime.   Yes Historical Provider, MD  cholecalciferol (VITAMIN D) 400 UNITS TABS tablet Take 400 Units by mouth daily.   Yes Historical Provider, MD  clopidogrel (PLAVIX) 75 MG tablet Take 1 tablet (75 mg total) by mouth daily with breakfast. 04/25/13  Yes Theodis Blaze, MD  glipiZIDE (GLUCOTROL) 10 MG tablet Take 10 mg by mouth daily before breakfast.   Yes Historical Provider, MD  losartan (COZAAR) 50 MG tablet Take 50 mg by mouth daily.   Yes Historical Provider, MD  metFORMIN (GLUCOPHAGE) 1000 MG tablet Take 1,000 mg by mouth 2 (two) times daily.   Yes Historical Provider, MD  HYDROcodone-acetaminophen (NORCO/VICODIN) 5-325 MG per tablet Take 1-2 tablets by mouth every 6 (six) hours as needed. Patient not taking: Reported on 02/09/2015 12/19/14   Dahlia Bailiff, PA-C   BP 132/78 mmHg  Pulse 91  Temp(Src) 97.7 F (36.5 C) (Oral)  Resp 20  SpO2 97% Physical Exam  Constitutional: He is oriented to person, place, and time. He appears well-developed and well-nourished. No distress.  HENT:  Head: Atraumatic.  Eyes: Conjunctivae are normal.  Neck: Neck supple.  Cardiovascular: Normal rate and regular rhythm.   Pulmonary/Chest: Effort normal and  breath sounds normal. No respiratory distress. He has no wheezes. He has no rales. He exhibits tenderness (Tenderness along right lateral ribs on palpation but no crepitus or emphysema appreciated. No rash.).  Abdominal: Soft. There is no tenderness.  Musculoskeletal: He exhibits tenderness (Tenderness to right parathoracic and paralumbar region, no rash, or bruising noted.).  Neurological: He is alert and oriented to person, place, and time.  Skin: No rash noted.  Psychiatric: He has a normal mood and affect.   Nursing note and vitals reviewed.   ED Course  Procedures (including critical care time)  7:12 AM Patient with history of prostate cancer here for progressive worsening persistent pain to his right flank and right side of chest. Symptoms concerning for possible metastatic malignancy. We'll perform screening exam. Otherwise patient in no acute discomfort. No shortness of breath concerning for PE.  8:52 AM X-ray of the ribs, and L-spine shows no acute fractures or malignancy. However, given his medical comorbidity, he may benefit from further imaging including PET scan to rule out metastatic changes. I recommend patient to follow-up closely with oncologist for further care. His blood sugar is elevated today however patient did not take his morning medication. Hemoglobin shown mild anemia, he will need to have that rechecked by PCP as well. Valium prescribed for muscle cramping.  Care discussed with Dr. Colin Rhein  Labs Review Labs Reviewed  CBC WITH DIFFERENTIAL/PLATELET - Abnormal; Notable for the following:    RBC 3.94 (*)    Hemoglobin 10.2 (*)    HCT 31.4 (*)    MCH 25.9 (*)    All other components within normal limits  COMPREHENSIVE METABOLIC PANEL - Abnormal; Notable for the following:    Sodium 134 (*)    Chloride 98 (*)    Glucose, Bld 354 (*)    Albumin 3.4 (*)    Alkaline Phosphatase 322 (*)    All other components within normal limits  LIPASE, BLOOD - Abnormal; Notable for the following:    Lipase 17 (*)    All other components within normal limits  URINALYSIS, ROUTINE W REFLEX MICROSCOPIC (NOT AT [facility]) - Abnormal; Notable for the following:    Glucose, UA >1000 (*)    All other components within normal limits  URINE MICROSCOPIC-ADD ON    Imaging Review Dg Chest 2 View  02/09/2015   CLINICAL DATA:  Chest pain for 3 months.  Initial evaluation.  EXAM: CHEST  2 VIEW  COMPARISON:  12/19/2014.  FINDINGS: The heart size and mediastinal contours are within normal limits. Both  lungs are clear. The visualized skeletal structures are unremarkable.  IMPRESSION: No active cardiopulmonary disease.   Electronically Signed   By: Marcello Moores  Register   On: 02/09/2015 08:22   Dg Lumbar Spine Complete  02/09/2015   CLINICAL DATA:  Right flank pain.  Back pain.  EXAM: LUMBAR SPINE - COMPLETE 4+ VIEW  COMPARISON:  CT of the abdomen and pelvis 09/29/2012.  FINDINGS: Five non rib-bearing lumbar type vertebral bodies are present. Vertebral body heights are maintained. Slight anterolisthesis at L5-S1 is stable. Advanced degenerative changes are present in the lumbar facets at L4-5 and to a much greater extent at L5-S1. There is some chronic loss of disc height at L5-S1. Atherosclerotic changes are again noted within the abdominal aorta.  IMPRESSION: 1. 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 2. Are mild facet degenerative changes at L4-5 are also stable. 3. No acute abnormality.   Electronically Signed   By:  San Morelle M.D.   On: 02/09/2015 08:22     EKG Interpretation None      MDM   Final diagnoses:  Rib pain on right side  Back pain    BP 135/79 mmHg  Pulse 89  Temp(Src) 97.7 F (36.5 C) (Oral)  Resp 18  SpO2 100%  I have reviewed nursing notes and vital signs. I personally viewed the imaging tests through PACS system and agrees with radiologist's intepretation I reviewed available ER/hospitalization records through the EMR     Domenic Moras, PA-C 02/09/15 2820  Debby Freiberg, MD 02/10/15 1029

## 2015-04-10 ENCOUNTER — Emergency Department (HOSPITAL_COMMUNITY): Payer: PPO

## 2015-04-10 ENCOUNTER — Encounter (HOSPITAL_COMMUNITY): Payer: Self-pay | Admitting: Family Medicine

## 2015-04-10 ENCOUNTER — Emergency Department (HOSPITAL_COMMUNITY)
Admission: EM | Admit: 2015-04-10 | Discharge: 2015-04-10 | Disposition: A | Discharge status: Home / Self Care | Payer: PPO | Attending: Emergency Medicine | Admitting: Emergency Medicine

## 2015-04-10 DIAGNOSIS — Z7982 Long term (current) use of aspirin: Secondary | ICD-10-CM | POA: Insufficient documentation

## 2015-04-10 DIAGNOSIS — I10 Essential (primary) hypertension: Secondary | ICD-10-CM | POA: Insufficient documentation

## 2015-04-10 DIAGNOSIS — K5900 Constipation, unspecified: Secondary | ICD-10-CM | POA: Diagnosis not present

## 2015-04-10 DIAGNOSIS — Z79899 Other long term (current) drug therapy: Secondary | ICD-10-CM | POA: Diagnosis not present

## 2015-04-10 DIAGNOSIS — R109 Unspecified abdominal pain: Secondary | ICD-10-CM | POA: Diagnosis present

## 2015-04-10 DIAGNOSIS — C7951 Secondary malignant neoplasm of bone: Secondary | ICD-10-CM | POA: Insufficient documentation

## 2015-04-10 DIAGNOSIS — Z8673 Personal history of transient ischemic attack (TIA), and cerebral infarction without residual deficits: Secondary | ICD-10-CM | POA: Insufficient documentation

## 2015-04-10 DIAGNOSIS — E119 Type 2 diabetes mellitus without complications: Secondary | ICD-10-CM | POA: Insufficient documentation

## 2015-04-10 DIAGNOSIS — C61 Malignant neoplasm of prostate: Secondary | ICD-10-CM | POA: Diagnosis not present

## 2015-04-10 DIAGNOSIS — R1030 Lower abdominal pain, unspecified: Secondary | ICD-10-CM | POA: Diagnosis not present

## 2015-04-10 HISTORY — DX: Malignant neoplasm of prostate: C61

## 2015-04-10 LAB — COMPREHENSIVE METABOLIC PANEL
ALK PHOS: 785 U/L — AB (ref 38–126)
ALT: 18 U/L (ref 17–63)
AST: 33 U/L (ref 15–41)
Albumin: 3.4 g/dL — ABNORMAL LOW (ref 3.5–5.0)
Anion gap: 5 (ref 5–15)
BUN: 10 mg/dL (ref 6–20)
CO2: 29 mmol/L (ref 22–32)
CREATININE: 0.5 mg/dL — AB (ref 0.61–1.24)
Calcium: 9.6 mg/dL (ref 8.9–10.3)
Chloride: 103 mmol/L (ref 101–111)
Glucose, Bld: 342 mg/dL — ABNORMAL HIGH (ref 65–99)
Potassium: 4.1 mmol/L (ref 3.5–5.1)
Sodium: 137 mmol/L (ref 135–145)
Total Bilirubin: 0.6 mg/dL (ref 0.3–1.2)
Total Protein: 7 g/dL (ref 6.5–8.1)

## 2015-04-10 LAB — CBC WITH DIFFERENTIAL/PLATELET
Basophils Absolute: 0 10*3/uL (ref 0.0–0.1)
Basophils Relative: 0 % (ref 0–1)
EOS PCT: 1 % (ref 0–5)
Eosinophils Absolute: 0 10*3/uL (ref 0.0–0.7)
HCT: 30.7 % — ABNORMAL LOW (ref 39.0–52.0)
Hemoglobin: 9.8 g/dL — ABNORMAL LOW (ref 13.0–17.0)
LYMPHS ABS: 1.3 10*3/uL (ref 0.7–4.0)
LYMPHS PCT: 28 % (ref 12–46)
MCH: 25.4 pg — AB (ref 26.0–34.0)
MCHC: 31.9 g/dL (ref 30.0–36.0)
MCV: 79.5 fL (ref 78.0–100.0)
MONOS PCT: 8 % (ref 3–12)
Monocytes Absolute: 0.4 10*3/uL (ref 0.1–1.0)
Neutro Abs: 3 10*3/uL (ref 1.7–7.7)
Neutrophils Relative %: 63 % (ref 43–77)
PLATELETS: 236 10*3/uL (ref 150–400)
RBC: 3.86 MIL/uL — ABNORMAL LOW (ref 4.22–5.81)
RDW: 13.5 % (ref 11.5–15.5)
WBC: 4.7 10*3/uL (ref 4.0–10.5)

## 2015-04-10 MED ORDER — OXYCODONE HCL 5 MG PO TABS: 2.5000 mg | ORAL_TABLET | ORAL | Status: DC | PRN

## 2015-04-10 MED ORDER — KETOROLAC TROMETHAMINE 30 MG/ML IJ SOLN
15.0000 mg | Freq: Once | INTRAMUSCULAR | Status: AC
  Administered 2015-04-10: 15 mg via INTRAVENOUS
  Filled 2015-04-10: qty 1

## 2015-04-10 MED ORDER — ACETAMINOPHEN 500 MG PO TABS
1000.0000 mg | ORAL_TABLET | Freq: Once | ORAL | Status: AC
  Administered 2015-04-10: 1000 mg via ORAL
  Filled 2015-04-10: qty 2

## 2015-04-10 NOTE — ED Notes (Signed)
First contact with patient. Return from ultrasound. Respirations easy non labored.

## 2015-04-10 NOTE — ED Notes (Signed)
Pt states he is experiencing right flank pain that has been going on for 2-3 months. Denies any complications with nausea, vomiting, urinary symptoms, or fever. Pt states he has been seen for same symptoms with no diagnosis.

## 2015-04-10 NOTE — ED Provider Notes (Signed)
CSN: [id number]     Arrival date & time 04/10/15  0602 History   First MD Initiated Contact with Patient 04/10/15 0701     Chief Complaint  Patient presents with  . Flank Pain     (Consider location/radiation/quality/duration/timing/severity/associated sxs/prior Treatment) Patient is a 69 y.o. male presenting with abdominal pain. The history is provided by the patient.  Abdominal Pain Pain location:  R flank Pain quality: sharp, shooting and tugging   Pain radiates to:  Does not radiate Pain severity:  Moderate Onset quality:  Gradual Duration:  3 weeks Timing:  Constant Progression:  Worsening Chronicity:  New Relieved by:  Movement Worsened by:  Position changes Ineffective treatments:  None tried Associated symptoms: constipation (hard stools)   Associated symptoms: no chest pain, no chills, no diarrhea, no fever, no melena, no nausea, no shortness of breath and no vomiting    69 yo M with a chief complaint of right flank pain. This been going on for about 3 months. Patient has a history of prostate cancer and states that he has metastases to some of the bones in his body. Patient denies fevers chills denies nausea or vomiting. Pain is dull feels like a pulling is there constantly is worse with lying back flat or sitting down. Pain is better when he moves around. Denies hematuria denies history of kidney stones. Has been mildly constipated over the past 3 months.  Past Medical History  Diagnosis Date  . Stroke   . Diabetes mellitus   . Hypertension   . Prostate cancer    Past Surgical History  Procedure Laterality Date  . Eye surgery     Family History  Problem Relation Age of Onset  . Cancer Mother   . Hypertension Mother   . Hypertension Father   . Cancer Father   . Diabetes Sister   . Hypertension Sister   . Hypertension Brother   . Diabetes Brother    Social History  Substance Use Topics  . Smoking status: Never Smoker   . Smokeless tobacco: None  .  Alcohol Use: No    Review of Systems  Constitutional: Negative for fever and chills.  HENT: Negative for congestion and facial swelling.   Eyes: Negative for discharge and visual disturbance.  Respiratory: Negative for shortness of breath.   Cardiovascular: Negative for chest pain and palpitations.  Gastrointestinal: Positive for abdominal pain (lower, dull) and constipation (hard stools). Negative for nausea, vomiting, diarrhea and melena.  Genitourinary: Positive for flank pain (R sided).  Musculoskeletal: Negative for myalgias and arthralgias.  Skin: Negative for color change and rash.  Neurological: Negative for tremors, syncope and headaches.  Psychiatric/Behavioral: Negative for confusion and dysphoric mood.      Allergies  Review of patient's allergies indicates no known allergies.  Home Medications   Prior to Admission medications   Medication Sig Start Date End Date Taking? Authorizing Provider  acetaminophen (TYLENOL) 500 MG tablet Take 1,000 mg by mouth every 6 (six) hours as needed (for pain.).    Historical Provider, MD  aspirin 81 MG chewable tablet Chew 81 mg by mouth daily.    Historical Provider, MD  atorvastatin (LIPITOR) 40 MG tablet Take 40 mg by mouth at bedtime.    Historical Provider, MD  cholecalciferol (VITAMIN D) 400 UNITS TABS tablet Take 400 Units by mouth daily.    Historical Provider, MD  clopidogrel (PLAVIX) 75 MG tablet Take 1 tablet (75 mg total) by mouth daily with breakfast. 04/25/13  Theodis Blaze, MD  diazepam (VALIUM) 5 MG tablet Take 1 tablet (5 mg total) by mouth every 12 (twelve) hours as needed for anxiety or muscle spasms. 02/09/15   Domenic Moras, PA-C  glipiZIDE (GLUCOTROL) 10 MG tablet Take 10 mg by mouth daily before breakfast.    Historical Provider, MD  HYDROcodone-acetaminophen (NORCO/VICODIN) 5-325 MG per tablet Take 1-2 tablets by mouth every 6 (six) hours as needed. Patient not taking: Reported on 02/09/2015 12/19/14   Dahlia Bailiff, PA-C   losartan (COZAAR) 50 MG tablet Take 50 mg by mouth daily.    Historical Provider, MD  metFORMIN (GLUCOPHAGE) 1000 MG tablet Take 1,000 mg by mouth 2 (two) times daily.    Historical Provider, MD  oxyCODONE (ROXICODONE) 5 MG immediate release tablet Take 0.5 tablets (2.5 mg total) by mouth every 4 (four) hours as needed for severe pain. 04/10/15   Deno Etienne, DO   BP 161/72 mmHg  Pulse 89  Temp(Src) 97.6 F (36.4 C) (Oral)  Resp 20  Ht 5\' 6"  (1.676 m)  Wt 155 lb (70.308 kg)  BMI 25.03 kg/m2  SpO2 100% Physical Exam  Constitutional: He is oriented to person, place, and time. He appears well-developed and well-nourished.  HENT:  Head: Normocephalic and atraumatic.  Eyes: EOM are normal. Pupils are equal, round, and reactive to light.  Neck: Normal range of motion. Neck supple. No JVD present.  Cardiovascular: Normal rate, regular rhythm and intact distal pulses.  Exam reveals no gallop and no friction rub.   No murmur heard. 2+ DP pulses  Pulmonary/Chest: No respiratory distress. He has no wheezes.  Abdominal: He exhibits no distension. There is no tenderness. There is no rebound and no guarding.  Benign abdominal exam  Musculoskeletal: Normal range of motion.  Neurological: He is alert and oriented to person, place, and time.  Skin: No rash noted. No pallor.  Psychiatric: He has a normal mood and affect. His behavior is normal.    ED Course  Procedures (including critical care time) Labs Review Labs Reviewed  CBC WITH DIFFERENTIAL/PLATELET - Abnormal; Notable for the following:    RBC 3.86 (*)    Hemoglobin 9.8 (*)    HCT 30.7 (*)    MCH 25.4 (*)    All other components within normal limits  COMPREHENSIVE METABOLIC PANEL - Abnormal; Notable for the following:    Glucose, Bld 342 (*)    Creatinine, Ser 0.50 (*)    Albumin 3.4 (*)    Alkaline Phosphatase 785 (*)    All other components within normal limits  URINALYSIS, ROUTINE W REFLEX MICROSCOPIC (NOT AT [facility])     Imaging Review Ct Renal Stone Study  04/10/2015   CLINICAL DATA:  Right flank pain 2-3 months. History of prostate cancer.  EXAM: CT ABDOMEN AND PELVIS WITHOUT CONTRAST  TECHNIQUE: Multidetector CT imaging of the abdomen and pelvis was performed following the standard protocol without IV contrast.  COMPARISON:  CT 09/29/2012, bone scan 12/14/2013 and lumbar spine films 02/09/2015  FINDINGS: Lung bases are within normal.  Abdominal images demonstrate a normal liver, spleen, pancreas, gallbladder and adrenal glands. Appendix is normal.  Kidneys are normal in size, shape and position without hydronephrosis or nephrolithiasis. No perinephric inflammation or fluid. Ureters are within normal.  Mild fecal retention over the rectosigmoid colon. Small bowel is within normal. Mesentery is within normal without inflammation or free fluid. There is mild calcified plaque over the abdominal aorta and iliac arteries.  Pelvic images demonstrate the bladder  and prostate to be within normal. No free fluid. No adenopathy.  Worsening patchy areas of sclerosis over the femurs right worse than left as well as the pelvic bones and spine compatible with known metastatic disease. Patchy sclerosis over the visualized lower sternum. Lytic process with pathologic fracture along the right side of the L4 vertebral body. Degenerative changes of the spine with disc disease from the L3-4 level to the L5-S1 level.  IMPRESSION: No acute intra-abdominal/pelvic process.  Worsening known osseous metastatic disease from patient's prostate cancer involving the proximal femurs right worse than left, pelvic bones, spine and sternum. Lytic process with pathologic fracture along the right side of the L4 vertebral body new since the previous exam and not well visualized on plain films 02/09/2015.   Electronically Signed   By: Marin Olp M.D.   On: 04/10/2015 07:46   I have personally reviewed and evaluated these images and lab results as part of  my medical decision-making.   EKG Interpretation None      MDM   Final diagnoses:  Right flank pain  Prostate cancer metastatic to bone    69 yo M with a chief complaint of lower abdominal pain and right flank pain. Concern for possible metastasis versus AAA versus nephrolithiasis will obtain a CT stone study.  CT scan with worsening metastasis no noted signs of urolithiasis or AAA. Lab studies unremarkable. Discussed results with patient has an appointment with his oncologist in 2 days.  9:57 AM:  I have discussed the diagnosis/risks/treatment options with the patient and believe the pt to be eligible for discharge home to follow-up with Oncologist. We also discussed returning to the ED immediately if new or worsening sx occur. We discussed the sx which are most concerning (e.g., sudden worsening pain) that necessitate immediate return. Medications administered to the patient during their visit and any new prescriptions provided to the patient are listed below.  Medications given during this visit Medications  ketorolac (TORADOL) 30 MG/ML injection 15 mg (15 mg Intravenous Given 04/10/15 0739)  acetaminophen (TYLENOL) tablet 1,000 mg (1,000 mg Oral Given 04/10/15 0739)    Discharge Medication List as of 04/10/2015  7:56 AM    START taking these medications   Details  oxyCODONE (ROXICODONE) 5 MG immediate release tablet Take 0.5 tablets (2.5 mg total) by mouth every 4 (four) hours as needed for severe pain., Starting 04/10/2015, Until Discontinued, Print         The patient appears reasonably screen and/or stabilized for discharge and I doubt any other medical condition or other [facility] requiring further screening, evaluation, or treatment in the ED at this time prior to discharge.    Deno Etienne, DO 04/10/15 [phone]

## 2015-04-10 NOTE — ED Notes (Signed)
Pt is aware a urine sample is needed but reports he is unable to urinate.

## 2015-04-10 NOTE — ED Notes (Signed)
Pt alert x4 respirations easy non labored. Ambulates without distress.

## 2015-04-10 NOTE — ED Notes (Signed)
Bed: WA21 Expected date:  Expected time:  Means of arrival:  Comments: 

## 2015-04-10 NOTE — Discharge Instructions (Signed)
Take 4 over the counter ibuprofen tablets 3 times a day or 2 over-the-counter naproxen tablets twice a day for pain.  Bone Metastases Cancerous growths can begin in any part of the body. The original site of cancer is called the primary tumor or primary cancer (for example, breast cancer). After cancer has developed in one area of the body, cancerous cells from that area can break away and travel through the body's bloodstream. If these cancerous cells begin growing in another place in the body, they are called metastases. Bone metastases are cancer cells that have spread to the bone (which is different from a cancer that starts in the bone). These secondary growths are like the original tumor. For example, if a prostate cancer spreads to bone it is called metastatic prostate cancer, or prostate cancer metastatic to bone, but not bone cancer. Cancers can spread to almost any bone; the spine and pelvis are often involved.  Any type of cancer can spread to the bone, but the most common are breast, lung, kidney, thyroid and prostate cancers. Sometimes the primary tumor is not discovered until there are bone problems. If the primary cancer location cannot be discovered, the cancer is called cancer of unknown primary location. SYMPTOMS  Pain in the bones is the main symptom of bone metastases. Some other problems may occur first including:  Decreased appetite.  Nausea.  Muscle weakness.  Confusion.  Unusual sleep patterns due to discomfort.  Overly tired (fatigue).  Restlessness. Frail or brittle bones may lead to broken bones (fractures) that lead to learning what is wrong (diagnosis). A tumor often weakens the bones.  DIAGNOSIS  Metastatic cancers may be found months or years after or at the same time as the primary tumor. When a second tumor is found in a patient who has been treated for cancer, it is more often a metastasis than another primary tumor.  The patient's symptoms, physical  examination, X-rays and blood tests may suggest a bone metastases. In addition, an examination of tissue or a cell sample (biopsy) is usually done to find the cancer. This sample is removed with a needle. This tissue sample must be looked at under a microscope to confirm a diagnosis. TREATMENT  Options generally include treatments that give relief from symptoms (palliative) or curative. Those with advanced, metastasized cancer may receive treatment focused on pain relief and prolonging life. These treatments depend on the type of cancer and its location.  Treatment for cancer depends on its type and location. Some of these treatments are:  Surgery to remove the original tumor and/or to remove parts of the body that produce hormones and other chemicals that make cancer worse.  Treatment with drugs (chemotherapy).  Bone marrow transplantations on rare occasions.  Radiation therapy (radiotherapy).  Hormonal therapy.  Pain relieving medications. Your caregiver will help you understand the likelihood that any particular treatment will be helpful for you. While some treatments aim to cure or control the cancer, others give relief from symptoms only. If you have bone metastases, radiation therapy may be recommended to treat pain (if it is in one main location). Pain medications are available. These include strong medicines like morphine. You may be instructed to take a long-acting pain medication (to control most of your pain) and a short-acting medication to control occasional flares of pain. Pain medication is sometimes also given continuously through a pump. HOME CARE INSTRUCTIONS   Take medications exactly as prescribed.  Keep any follow-up appointments.  Pain medications can make  you sleepy or confused. Do not drive, climb ladders, or do other dangerous activities while on pain medication.  Pain medications often cause constipation. Ask your caregiver for information on stool softeners.  Do  not share your pain medication with others. SEEK MEDICAL CARE IF:   Your bone pain is not controlled.  You are having problems or side effects from your medication.  You have excessive sleepiness or confusion. SEEK IMMEDIATE MEDICAL CARE IF:   You fall and have any injury or pain from the fall.  You have trouble walking.  You have numbness or tingling in your legs.  You develop a sudden significant worsening of your pain. Document Released: 07/26/2002 Document Revised: 10/28/2011 Document Reviewed: 03/18/2008 [facility] Patient Information 2015 [place], [place]. This information is not intended to replace advice given to you by your health care provider. Make sure you discuss any questions you have with your health care provider.

## 2015-04-10 NOTE — ED Notes (Signed)
Patient transported to Ultrasound 

## 2015-04-18 ENCOUNTER — Other Ambulatory Visit (HOSPITAL_COMMUNITY): Payer: Self-pay | Admitting: Urology

## 2015-04-18 DIAGNOSIS — C61 Malignant neoplasm of prostate: Secondary | ICD-10-CM

## 2015-05-01 ENCOUNTER — Encounter (HOSPITAL_COMMUNITY)
Admission: RE | Admit: 2015-05-01 | Discharge: 2015-05-01 | Disposition: A | Discharge status: Home / Self Care | Payer: PPO | Source: Ambulatory Visit | Attending: Urology | Admitting: Urology

## 2015-05-01 DIAGNOSIS — C61 Malignant neoplasm of prostate: Secondary | ICD-10-CM | POA: Insufficient documentation

## 2015-05-01 DIAGNOSIS — C7951 Secondary malignant neoplasm of bone: Secondary | ICD-10-CM | POA: Insufficient documentation

## 2015-05-01 MED ORDER — TECHNETIUM TC 99M MEDRONATE IV KIT
27.3000 | PACK | Freq: Once | INTRAVENOUS | Status: AC | PRN
  Administered 2015-05-01: 27.3 via INTRAVENOUS

## 2015-05-18 ENCOUNTER — Telehealth: Payer: Self-pay | Admitting: Oncology

## 2015-05-18 NOTE — Telephone Encounter (Signed)
New patient appt-s/w patient and gave np appt for 10/04 @ 1:30 w/Dr. Alen Blew referrign Dr. Lowella Bandy Dx- Castrate resistant prostate cancer  Referral information scanned

## 2015-05-23 ENCOUNTER — Telehealth: Payer: Self-pay | Admitting: *Deleted

## 2015-05-23 ENCOUNTER — Other Ambulatory Visit: Payer: PPO

## 2015-05-23 ENCOUNTER — Ambulatory Visit (HOSPITAL_BASED_OUTPATIENT_CLINIC_OR_DEPARTMENT_OTHER): Payer: PPO | Admitting: Oncology

## 2015-05-23 ENCOUNTER — Telehealth: Payer: Self-pay | Admitting: Oncology

## 2015-05-23 VITALS — BP 111/52 | HR 105 | Temp 97.4°F | Resp 18 | Ht 66.0 in | Wt 128.4 lb

## 2015-05-23 DIAGNOSIS — C7951 Secondary malignant neoplasm of bone: Secondary | ICD-10-CM | POA: Diagnosis not present

## 2015-05-23 DIAGNOSIS — C61 Malignant neoplasm of prostate: Secondary | ICD-10-CM | POA: Diagnosis not present

## 2015-05-23 DIAGNOSIS — R634 Abnormal weight loss: Secondary | ICD-10-CM

## 2015-05-23 MED ORDER — LIDOCAINE-PRILOCAINE 2.5-2.5 % EX CREA: 1.0000 "application " | TOPICAL_CREAM | CUTANEOUS | Status: AC | PRN

## 2015-05-23 MED ORDER — PROCHLORPERAZINE MALEATE 10 MG PO TABS: 10.0000 mg | ORAL_TABLET | Freq: Four times a day (QID) | ORAL | Status: DC | PRN

## 2015-05-23 NOTE — Progress Notes (Signed)
Please see consult note.  

## 2015-05-23 NOTE — Telephone Encounter (Signed)
Per staff message and POF I have scheduled appts. Advised scheduler of appts. JMW  

## 2015-05-23 NOTE — Telephone Encounter (Signed)
Pt confirmed labs/ov/chemo edu per 10/04 POF, gave pt AVS and Calendar... KJ, lft msg for IR to call pt to schedule port placement.

## 2015-05-23 NOTE — Consult Note (Signed)
Reason for Referral: Prostate cancer.   HPI: Ricky Terry is a 69 year old gentleman currently of [place] where he lived the majority of his life. He is a gentleman with history of hypertension, hyperlipidemia and diabetes. He was diagnosed with prostate cancer in April 2015. At that time he presented with a PSA of 368 and symptoms of nocturia and back pain. He was evaluated by Dr. Janice Norrie and a biopsy of his prostate showed a Gleason score 4+5 = 9 in number of cores dose of the cores were all involved with malignancy with different Gleason scores ranging between 7 and 9. His initial staging workup revealed bony metastasis at the time of diagnosis. He was treated with hormone therapy and his PSA initially dropped to 34 in September 2015. His PSA was up to 72 and February 2016 and Casodex was added. His most recent PSA was up to 77.9 on 02/28/2015. His repeat bone scan showed marked progression of his bony metastasis compared to his initial imaging studies. I was asked to comment about the status of his cancer and treatment options. Clinically patient have reported increased symptoms. He has had about 5 pound weight loss and increase in his bone pain. He also noticed declining energy and performance status. 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 His urine output is reasonable without any hematuria or dysuria.  He does not report any headaches, blurry vision, syncope or seizures. He does not report any fevers, chills or sweats. He does not report any chest pain, palpitation orthopnea. Does not report any leg edema, cough, hemoptysis or wheezing. Does not report any nausea, vomiting, abdominal pain, hematochezia or melena. He does not report any frequency, urgency or hesitancy. He does report skeletal complaints clinic back pain or hip pain. Does not report any lymphadenopathy or petechiae. Remaining review of systems unremarkable.   Past Medical History  Diagnosis Date  . Stroke   .  Diabetes mellitus   . Hypertension   . Prostate cancer   :  Past Surgical History  Procedure Laterality Date  . Eye surgery    :   Current outpatient prescriptions:  .  acetaminophen (TYLENOL) 500 MG tablet, Take 1,000 mg by mouth every 6 (six) hours as needed (for pain.)., Disp: , Rfl:  .  aspirin 81 MG chewable tablet, Chew 81 mg by mouth daily., Disp: , Rfl:  .  atorvastatin (LIPITOR) 40 MG tablet, Take 40 mg by mouth at bedtime., Disp: , Rfl:  .  cholecalciferol (VITAMIN D) 400 UNITS TABS tablet, Take 400 Units by mouth daily., Disp: , Rfl:  .  clopidogrel (PLAVIX) 75 MG tablet, Take 1 tablet (75 mg total) by mouth daily with breakfast., Disp: 30 tablet, Rfl: 1 .  diazepam (VALIUM) 5 MG tablet, Take 1 tablet (5 mg total) by mouth every 12 (twelve) hours as needed for anxiety or muscle spasms., Disp: 10 tablet, Rfl: 0 .  glipiZIDE (GLUCOTROL) 10 MG tablet, Take 10 mg by mouth daily before breakfast., Disp: , Rfl:  .  HYDROcodone-acetaminophen (NORCO/VICODIN) 5-325 MG per tablet, Take 1-2 tablets by mouth every 6 (six) hours as needed., Disp: 15 tablet, Rfl: 0 .  lidocaine-prilocaine (EMLA) cream, Apply 1 application topically as needed., Disp: 30 g, Rfl: 0 .  losartan (COZAAR) 50 MG tablet, Take 50 mg by mouth daily., Disp: , Rfl:  .  metFORMIN (GLUCOPHAGE) 1000 MG tablet, Take 1,000 mg by mouth 2 (two) times daily., Disp: , Rfl:  .  oxyCODONE (  ROXICODONE) 5 MG immediate release tablet, Take 0.5 tablets (2.5 mg total) by mouth every 4 (four) hours as needed for severe pain., Disp: 10 tablet, Rfl: 0 .  prochlorperazine (COMPAZINE) 10 MG tablet, Take 1 tablet (10 mg total) by mouth every 6 (six) hours as needed for nausea or vomiting., Disp: 30 tablet, Rfl: 0:  No Known Allergies:  Family History  Problem Relation Age of Onset  . Cancer Mother   . Hypertension Mother   . Hypertension Father   . Cancer Father   . Diabetes Sister   . Hypertension Sister   . Hypertension Brother    . Diabetes Brother   :  Social History   Social History  . Marital Status: Legally Separated    Spouse Name: N/A  . Number of Children: N/A  . Years of Education: N/A   Occupational History  . Not on file.   Social History Main Topics  . Smoking status: Never Smoker   . Smokeless tobacco: Not on file  . Alcohol Use: No  . Drug Use: No  . Sexual Activity: Not on file   Other Topics Concern  . Not on file   Social History Narrative  :  Pertinent items are noted in HPI.  Exam: Blood pressure 111/52, pulse 105, temperature 97.4 F (36.3 C), temperature source Oral, resp. rate 18, height 5\' 6"  (1.676 m), weight 128 lb 6.4 oz (58.242 kg), SpO2 100 %. General appearance: alert and cooperative Head: Normocephalic, without obvious abnormality Nose: Nares normal. Septum midline. Mucosa normal. No drainage or sinus tenderness. Throat: lips, mucosa, and tongue normal; teeth and gums normal Neck: no adenopathy Back: negative Resp: clear to auscultation bilaterally Chest wall: no tenderness Cardio: regular rate and rhythm, S1, S2 normal, no murmur, click, rub or gallop GI: soft, non-tender; bowel sounds normal; no masses,  no organomegaly Extremities: extremities normal, atraumatic, no cyanosis or edema Pulses: 2+ and symmetric Skin: Skin color, texture, turgor normal. No rashes or lesions Lymph nodes: Cervical, supraclavicular, and axillary nodes normal.  CBC    Component Value Date/Time   WBC 4.7 04/10/2015 0644   RBC 3.86* 04/10/2015 0644   HGB 9.8* 04/10/2015 0644   HCT 30.7* 04/10/2015 0644   PLT 236 04/10/2015 0644   MCV 79.5 04/10/2015 0644   MCH 25.4* 04/10/2015 0644   MCHC 31.9 04/10/2015 0644   RDW 13.5 04/10/2015 0644   LYMPHSABS 1.3 04/10/2015 0644   MONOABS 0.4 04/10/2015 0644   EOSABS 0.0 04/10/2015 0644   BASOSABS 0.0 04/10/2015 0644      Chemistry      Component Value Date/Time   NA 137 04/10/2015 0644   K 4.1 04/10/2015 0644   CL 103  04/10/2015 0644   CO2 29 04/10/2015 0644   BUN 10 04/10/2015 0644   CREATININE 0.50* 04/10/2015 0644      Component Value Date/Time   CALCIUM 9.6 04/10/2015 0644   ALKPHOS 785* 04/10/2015 0644   AST 33 04/10/2015 0644   ALT 18 04/10/2015 0644   BILITOT 0.6 04/10/2015 0644      Nm Bone Scan Whole Body  05/01/2015   CLINICAL DATA:  Prostate cancer. Elevated PSA. Right hip and lower back pain.  EXAM: NUCLEAR MEDICINE WHOLE BODY BONE SCAN  TECHNIQUE: Whole body anterior and posterior images were obtained approximately 3 hours after intravenous injection of radiopharmaceutical.  RADIOPHARMACEUTICALS:  25  MCi Technetium-10m MDP IV  COMPARISON:  12/14/2013  FINDINGS: Marked progression of bony metastatic disease since prior  study. Innumerable lesions noted throughout the spine, sternum, ribs, bony pelvis and proximal femurs, right greater than left. Lesion in the tip of the right scapula is unchanged.  IMPRESSION: Marked interval progression of bony metastatic disease as above.   Electronically Signed   By: Rolm Baptise M.D.   On: 05/01/2015 14:49    Assessment and Plan:   69 year old gentleman with the following issues:  1. Castration resistant metastatic prostate cancer with disease to the bone. His initial diagnosis was in April 2015 with a PSA of 368 and a Gleason score of 4+5 = 9. He was initially treated with Lupron initially and Casodex added in March 2016 after his PSA started to rise. His initial response in his PSA went down to 34 and now is up to 77.9. His staging workup including a bone scan obtained on 02/28/2015 was reviewed today and showed progression of disease.  The natural course of castration resistant metastatic prostate cancer was discussed with the patient today. He understands that his disease is incurable and any treatment option would be palliative. He has aggressive disease with his hormonal responsiveness was very limited.  Options of treatment were reviewed today  includes cycle on hormone therapy with Zytiga, xtandi or ketoconazole. Immunotherapy such as Provenge was also reviewed but I think were less likely to help. Systemic chemotherapy in the form of Taxotere is also reviewed today given the aggressive nature of his cancer.  Risks and benefits of all these options were reviewed today and he is interested in receiving the most definitive an aggressive therapy out there. I think you'll be a good candidate for Taxotere chemotherapy.  Complications associated with this therapy were reviewed today in detail. Complications include nausea, vomiting, hair loss, myelosuppression, neutropenia, neutropenic sepsis, neuropathy, fluid retention and infusion related complications were discussed. The benefit would be palliation of his symptoms, reduction in his PSA and extending overall survival on average 3-4 months.  He is agreeable to proceed in the near future after chemotherapy education class.  2. IV access: Risks and benefits of Port-A-Cath insertion were reviewed. Complications include bleeding, thrombosis and infection. He is agreeable to have that done and EMLA cream was prescribed to the patient.  3. Nausea prophylaxis: Prescription for Compazine was given to the patient today.  4. Neutropenia prophylaxis: He will receive Neulasta prophylactically after each infusion to prevent complications associated with neutropenia. Complications associated with this medication include arthralgias, myalgias and flulike symptoms. Instructed him to use Claritin and hydrocodone if needed to.  5. Bone health: He is already on calcium and vitamin D and would benefit from bone directed therapy. He is awaiting dental clearance before proceeding with that.  6. Weight loss: We will continue to monitor him for signs or symptoms of malnutrition. Nutritional supplements were provided patient today.  7. Pain: He is currently on hydrocodone but might require long acting medication if  his pain is not reasonably controlled.  8. Prognosis: He understands that the goal of treatment is palliative although he has a reasonable performance status and wants aggressive therapy for the time being.  9. Follow-up: Will be in the next 2 weeks to receive chemotherapy and he'll be evaluated after each cycle.

## 2015-05-24 ENCOUNTER — Encounter: Payer: Self-pay | Admitting: *Deleted

## 2015-05-24 NOTE — Progress Notes (Signed)
Per dr Alen Blew, okay to hold plavix 5 days before port-a-cath insertion, do not resume taking plavix, until he sees dr Alen Blew. Spoke with patient, instructed him to stop his plavix. Patient verbalized understanding.

## 2015-05-25 ENCOUNTER — Telehealth: Payer: Self-pay | Admitting: Nutrition

## 2015-05-25 NOTE — Telephone Encounter (Signed)
Received message that patient requested samples of Ensure. Contacted patient by telephone who confirms he prefers chocolate or strawberry. Will provide samples and leave at front desk for patient to pick up today.

## 2015-05-26 ENCOUNTER — Encounter: Payer: Self-pay | Admitting: Oncology

## 2015-05-26 ENCOUNTER — Other Ambulatory Visit: Payer: Self-pay | Admitting: Radiology

## 2015-05-26 NOTE — Progress Notes (Signed)
Called pt to introduce myself as Estate manager/land agent and to see if he has any financial questions or concerns. Also, check to see if he is interested in copay assistance for Chemo drugs. My thought was PAF application.

## 2015-05-29 ENCOUNTER — Ambulatory Visit (HOSPITAL_COMMUNITY)
Admission: RE | Admit: 2015-05-29 | Discharge: 2015-05-29 | Disposition: A | Discharge status: Home / Self Care | Payer: PPO | Source: Ambulatory Visit | Attending: Oncology | Admitting: Oncology

## 2015-05-29 ENCOUNTER — Other Ambulatory Visit: Payer: PPO

## 2015-05-29 ENCOUNTER — Encounter: Payer: Self-pay | Admitting: *Deleted

## 2015-05-29 ENCOUNTER — Encounter (HOSPITAL_COMMUNITY): Payer: Self-pay

## 2015-05-29 ENCOUNTER — Other Ambulatory Visit: Payer: Self-pay | Admitting: Oncology

## 2015-05-29 DIAGNOSIS — C61 Malignant neoplasm of prostate: Secondary | ICD-10-CM

## 2015-05-29 DIAGNOSIS — Z7982 Long term (current) use of aspirin: Secondary | ICD-10-CM | POA: Insufficient documentation

## 2015-05-29 DIAGNOSIS — Z7984 Long term (current) use of oral hypoglycemic drugs: Secondary | ICD-10-CM | POA: Insufficient documentation

## 2015-05-29 LAB — CBC WITH DIFFERENTIAL/PLATELET
BASOS PCT: 1 %
Basophils Absolute: 0 10*3/uL (ref 0.0–0.1)
EOS ABS: 0 10*3/uL (ref 0.0–0.7)
Eosinophils Relative: 1 %
HCT: 30.7 % — ABNORMAL LOW (ref 39.0–52.0)
Hemoglobin: 9.8 g/dL — ABNORMAL LOW (ref 13.0–17.0)
Lymphocytes Relative: 31 %
Lymphs Abs: 1.6 10*3/uL (ref 0.7–4.0)
MCH: 25.2 pg — ABNORMAL LOW (ref 26.0–34.0)
MCHC: 31.9 g/dL (ref 30.0–36.0)
MCV: 78.9 fL (ref 78.0–100.0)
MONO ABS: 0.3 10*3/uL (ref 0.1–1.0)
MONOS PCT: 6 %
Neutro Abs: 3.2 10*3/uL (ref 1.7–7.7)
Neutrophils Relative %: 61 %
Platelets: 249 10*3/uL (ref 150–400)
RBC: 3.89 MIL/uL — ABNORMAL LOW (ref 4.22–5.81)
RDW: 13.6 % (ref 11.5–15.5)
WBC: 5.2 10*3/uL (ref 4.0–10.5)

## 2015-05-29 LAB — PROTIME-INR
INR: 1.07 (ref 0.00–1.49)
PROTHROMBIN TIME: 14.1 s (ref 11.6–15.2)

## 2015-05-29 LAB — GLUCOSE, CAPILLARY: Glucose-Capillary: 319 mg/dL — ABNORMAL HIGH (ref 65–99)

## 2015-05-29 MED ORDER — FENTANYL CITRATE (PF) 100 MCG/2ML IJ SOLN
INTRAMUSCULAR | Status: AC | PRN
  Administered 2015-05-29: 50 ug via INTRAVENOUS

## 2015-05-29 MED ORDER — HEPARIN SOD (PORK) LOCK FLUSH 100 UNIT/ML IV SOLN
INTRAVENOUS | Status: AC
  Filled 2015-05-29: qty 5

## 2015-05-29 MED ORDER — FENTANYL CITRATE (PF) 100 MCG/2ML IJ SOLN
INTRAMUSCULAR | Status: AC
  Filled 2015-05-29: qty 4

## 2015-05-29 MED ORDER — LIDOCAINE-EPINEPHRINE 2 %-1:100000 IJ SOLN
INTRAMUSCULAR | Status: AC
  Filled 2015-05-29: qty 1

## 2015-05-29 MED ORDER — LIDOCAINE HCL 1 % IJ SOLN
INTRAMUSCULAR | Status: AC
  Filled 2015-05-29: qty 20

## 2015-05-29 MED ORDER — MIDAZOLAM HCL 2 MG/2ML IJ SOLN
INTRAMUSCULAR | Status: AC | PRN
  Administered 2015-05-29 (×2): 0.5 mg via INTRAVENOUS
  Administered 2015-05-29: 1 mg via INTRAVENOUS

## 2015-05-29 MED ORDER — CEFAZOLIN SODIUM-DEXTROSE 2-3 GM-% IV SOLR: 2.0000 g | Freq: Once | INTRAVENOUS | Status: DC

## 2015-05-29 MED ORDER — HEPARIN SOD (PORK) LOCK FLUSH 100 UNIT/ML IV SOLN
INTRAVENOUS | Status: AC | PRN
  Administered 2015-05-29: 500 [IU]

## 2015-05-29 MED ORDER — SODIUM CHLORIDE 0.9 % IV SOLN
INTRAVENOUS | Status: DC
  Administered 2015-05-29: 500 mL via INTRAVENOUS

## 2015-05-29 MED ORDER — CEFAZOLIN SODIUM-DEXTROSE 2-3 GM-% IV SOLR
INTRAVENOUS | Status: AC
  Filled 2015-05-29: qty 50

## 2015-05-29 MED ORDER — MIDAZOLAM HCL 2 MG/2ML IJ SOLN
INTRAMUSCULAR | Status: AC
  Filled 2015-05-29: qty 6

## 2015-05-29 NOTE — H&P (Signed)
HPI: Patient with metastatic prostate cancer who has been seen by Dr. Alen Blew on 05/23/2015 and scheduled today for image guided port a catheter placement.  The patient has had a H&P performed within the last 30 days, all history, medications, and exam have been reviewed. The patient denies any interval changes since the H&P.  Medications: Prior to Admission medications   Medication Sig Start Date End Date Taking? Authorizing Provider  acetaminophen (TYLENOL) 500 MG tablet Take 1,000 mg by mouth every 6 (six) hours as needed (for pain.).   Yes Historical Provider, MD  aspirin 81 MG chewable tablet Chew 81 mg by mouth daily.   Yes Historical Provider, MD  atorvastatin (LIPITOR) 40 MG tablet Take 40 mg by mouth at bedtime.   Yes Historical Provider, MD  cholecalciferol (VITAMIN D) 400 UNITS TABS tablet Take 400 Units by mouth daily.   Yes Historical Provider, MD  clopidogrel (PLAVIX) 75 MG tablet Take 1 tablet (75 mg total) by mouth daily with breakfast. 04/25/13  Yes Theodis Blaze, MD  glipiZIDE (GLUCOTROL) 10 MG tablet Take 10 mg by mouth daily before breakfast.   Yes Historical Provider, MD  HYDROcodone-acetaminophen (NORCO/VICODIN) 5-325 MG per tablet Take 1-2 tablets by mouth every 6 (six) hours as needed. 12/19/14  Yes Dahlia Bailiff, PA-C  lidocaine-prilocaine (EMLA) cream Apply 1 application topically as needed. 05/23/15  Yes Wyatt Portela, MD  losartan (COZAAR) 50 MG tablet Take 50 mg by mouth daily.   Yes Historical Provider, MD  metFORMIN (GLUCOPHAGE) 1000 MG tablet Take 1,000 mg by mouth 2 (two) times daily.   Yes Historical Provider, MD  oxyCODONE (ROXICODONE) 5 MG immediate release tablet Take 0.5 tablets (2.5 mg total) by mouth every 4 (four) hours as needed for severe pain. 04/10/15  Yes Deno Etienne, DO  prochlorperazine (COMPAZINE) 10 MG tablet Take 1 tablet (10 mg total) by mouth every 6 (six) hours as needed for nausea or vomiting. 05/23/15  Yes Wyatt Portela, MD  diazepam (VALIUM) 5 MG  tablet Take 1 tablet (5 mg total) by mouth every 12 (twelve) hours as needed for anxiety or muscle spasms. Patient not taking: Reported on 05/29/2015 02/09/15   Domenic Moras, PA-C     Vital Signs: BP 140/64 mmHg  Pulse 103  Temp(Src) 97.4 F (36.3 C) (Oral)  Resp 18  Ht 5\' 6"  (1.676 m)  Wt 123 lb 6 oz (55.963 kg)  BMI 19.92 kg/m2  SpO2 98%  Physical Exam  Constitutional: He is oriented to person, place, and time. No distress.  HENT:  Head: Normocephalic and atraumatic.  Cardiovascular: Exam reveals no gallop and no friction rub.   No murmur heard. Tachycardic  Pulmonary/Chest: Effort normal and breath sounds normal. No respiratory distress. He has no wheezes. He has no rales.  Neurological: He is alert and oriented to person, place, and time.  Skin: Skin is warm and dry. He is not diaphoretic.    Mallampati Score:  MD Evaluation Airway: WNL Heart: WNL Abdomen: WNL Chest/ Lungs: WNL ASA  Classification: 3 Mallampati/Airway Score: Two  Labs:  CBC:  Recent Labs  02/09/15 0632 04/10/15 0644 05/29/15 1210  WBC 4.4 4.7 5.2  HGB 10.2* 9.8* 9.8*  HCT 31.4* 30.7* 30.7*  PLT 220 236 249    COAGS: No results for input(s): INR, APTT in the last 8760 hours.  BMP:  Recent Labs  02/09/15 0632 04/10/15 0644  NA 134* 137  K 4.4 4.1  CL 98* 103  CO2 25  29  GLUCOSE 354* 342*  BUN 14 10  CALCIUM 9.5 9.6  CREATININE 0.65 0.50*  GFRNONAA >60 >60  GFRAA >60 >60    LIVER FUNCTION TESTS:  Recent Labs  02/09/15 0632 04/10/15 0644  BILITOT 0.3 0.6  AST 20 33  ALT 18 18  ALKPHOS 322* 785*  PROT 6.5 7.0  ALBUMIN 3.4* 3.4*    Assessment/Plan:  Metastatic prostate cancer Seen by Dr. Alen Blew 05/23/2015 Scheduled today for image guided port a catheter placement with sedation The patient has been NPO, no blood thinners taken, labs and vitals have been reviewed. Risks and Benefits discussed with the patient including, but not limited to bleeding, infection,  pneumothorax, or fibrin sheath development and need for additional procedures. All of the patient's questions were answered, patient is agreeable to proceed. Consent signed and in chart.   SignedHedy Jacob 05/29/2015, 12:34 PM

## 2015-05-29 NOTE — Procedures (Signed)
R IJ Port cathter placement with US and fluoroscopy No complication No blood loss. See complete dictation in [facility] PACS.  

## 2015-05-29 NOTE — Progress Notes (Signed)
Pt arrived here today for his PAC placement. He has been NPO pas MN and did not take his oral diabetic medication but his CBG is 319. Pt states he had spaghetti and gingerale last night at home. This reported to Tsosie Billing PA Radiology

## 2015-05-29 NOTE — Discharge Instructions (Signed)
Moderate Conscious Sedation, Adult °Sedation is the use of medicines to promote relaxation and relieve discomfort and anxiety. Moderate conscious sedation is a type of sedation. Under moderate conscious sedation you are less alert than normal but are still able to respond to instructions or stimulation. Moderate conscious sedation is used during short medical and dental procedures. It is milder than deep sedation or general anesthesia and allows you to return to your regular activities sooner. °LET YOUR HEALTH CARE PROVIDER KNOW ABOUT:  °· Any allergies you have. °· All medicines you are taking, including vitamins, herbs, eye drops, creams, and over-the-counter medicines. °· Use of steroids (by mouth or creams). °· Previous problems you or members of your family have had with the use of anesthetics. °· Any blood disorders you have. °· Previous surgeries you have had. °· Medical conditions you have. °· Possibility of pregnancy, if this applies. °· Use of cigarettes, alcohol, or illegal drugs. °RISKS AND COMPLICATIONS °Generally, this is a safe procedure. However, as with any procedure, problems can occur. Possible problems include: °· Oversedation. °· Trouble breathing on your own. You may need to have a breathing tube until you are awake and breathing on your own. °· Allergic reaction to any of the medicines used for the procedure. °BEFORE THE PROCEDURE °· You may have blood tests done. These tests can help show how well your kidneys and liver are working. They can also show how well your blood clots. °· A physical exam will be done.   °· Only take medicines as directed by your health care provider. You may need to stop taking medicines (such as blood thinners, aspirin, or nonsteroidal anti-inflammatory drugs) before the procedure.   °· Do not eat or drink at least 6 hours before the procedure or as directed by your health care provider. °· Arrange for a responsible adult, family member, or friend to take you home  after the procedure. He or she should stay with you for at least 24 hours after the procedure, until the medicine has worn off. °PROCEDURE  °· An intravenous (IV) catheter will be inserted into one of your veins. Medicine will be able to flow directly into your body through this catheter. You may be given medicine through this tube to help prevent pain and help you relax. °· The medical or dental procedure will be done. °AFTER THE PROCEDURE °· You will stay in a recovery area until the medicine has worn off. Your blood pressure and pulse will be checked.   °·  Depending on the procedure you had, you may be allowed to go home when you can tolerate liquids and your pain is under control. °  °This information is not intended to replace advice given to you by your health care provider. Make sure you discuss any questions you have with your health care provider. °  °Document Released: 04/30/2001 Document Revised: 08/26/2014 Document Reviewed: 04/12/2013 °Elsevier Interactive Patient Education ©2016 [employer] °Implanted Port Insertion, Care After °Refer to this sheet in the next few weeks. These instructions provide you with information on caring for yourself after your procedure. Your health care provider may also give you more specific instructions. Your treatment has been planned according to current medical practices, but problems sometimes occur. Call your health care provider if you have any problems or questions after your procedure. °WHAT TO EXPECT AFTER THE PROCEDURE °After your procedure, it is typical to have the following:  °· Discomfort at the port insertion site. Ice packs to the area will help. °· Bruising   on the skin over the port. This will subside in 3-4 days. °HOME CARE INSTRUCTIONS °· After your port is placed, you will get a manufacturer's information card. The card has information about your port. Keep this card with you at all times.   °· Know what kind of port you have. There are many types of  ports available.   °· Wear a medical alert bracelet in case of an emergency. This can help alert health care workers that you have a port.   °· The port can stay in for as long as your health care provider believes it is necessary.   °· A home health care nurse may give medicines and take care of the port.   °· You or a family member can get special training and directions for giving medicine and taking care of the port at home.   °SEEK MEDICAL CARE IF:  °· Your port does not flush or you are unable to get a blood return.   °· You have a fever or chills. °SEEK IMMEDIATE MEDICAL CARE IF: °· You have new fluid or pus coming from your incision.   °· You notice a bad smell coming from your incision site.   °· You have swelling, pain, or more redness at the incision or port site.   °· You have chest pain or shortness of breath. °  °This information is not intended to replace advice given to you by your health care provider. Make sure you discuss any questions you have with your health care provider. °  °Document Released: 05/26/2013 Document Revised: 08/10/2013 Document Reviewed: 05/26/2013 °Elsevier Interactive Patient Education ©2016 [employer] °Implanted Port Home Guide °An implanted port is a type of central line that is placed under the skin. Central lines are used to provide IV access when treatment or nutrition needs to be given through a person's veins. Implanted ports are used for long-term IV access. An implanted port may be placed because:  °· You need IV medicine that would be irritating to the small veins in your hands or arms.   °· You need long-term IV medicines, such as antibiotics.   °· You need IV nutrition for a long period.   °· You need frequent blood draws for lab tests.   °· You need dialysis.   °Implanted ports are usually placed in the chest area, but they can also be placed in the upper arm, the abdomen, or the leg. An implanted port has two main parts:  °· Reservoir. The reservoir is round  and will appear as a small, raised area under your skin. The reservoir is the part where a needle is inserted to give medicines or draw blood.   °· Catheter. The catheter is a thin, flexible tube that extends from the reservoir. The catheter is placed into a large vein. Medicine that is inserted into the reservoir goes into the catheter and then into the vein.   °HOW WILL I CARE FOR MY INCISION SITE? °Do not get the incision site wet. Bathe or shower as directed by your health care provider.  °HOW IS MY PORT ACCESSED? °Special steps must be taken to access the port:  °· Before the port is accessed, a numbing cream can be placed on the skin. This helps numb the skin over the port site.   °· Your health care provider uses a sterile technique to access the port. °· Your health care provider must put on a mask and sterile gloves. °· The skin over your port is cleaned carefully with an antiseptic and allowed to dry. °· The port   is gently pinched between sterile gloves, and a needle is inserted into the port. °· Only "non-coring" port needles should be used to access the port. Once the port is accessed, a blood return should be checked. This helps ensure that the port is in the vein and is not clogged.   °· If your port needs to remain accessed for a constant infusion, a clear (transparent) bandage will be placed over the needle site. The bandage and needle will need to be changed every week, or as directed by your health care provider.   °· Keep the bandage covering the needle clean and dry. Do not get it wet. Follow your health care provider's instructions on how to take a shower or bath while the port is accessed.   °· If your port does not need to stay accessed, no bandage is needed over the port.   °WHAT IS FLUSHING? °Flushing helps keep the port from getting clogged. Follow your health care provider's instructions on how and when to flush the port. Ports are usually flushed with saline solution or a medicine called  heparin. The need for flushing will depend on how the port is used.  °· If the port is used for intermittent medicines or blood draws, the port will need to be flushed:   °· After medicines have been given.   °· After blood has been drawn.   °· As part of routine maintenance.   °· If a constant infusion is running, the port may not need to be flushed.   °HOW LONG WILL MY PORT STAY IMPLANTED? °The port can stay in for as long as your health care provider thinks it is needed. When it is time for the port to come out, surgery will be done to remove it. The procedure is similar to the one performed when the port was put in.  °WHEN SHOULD I SEEK IMMEDIATE MEDICAL CARE? °When you have an implanted port, you should seek immediate medical care if:  °· You notice a bad smell coming from the incision site.   °· You have swelling, redness, or drainage at the incision site.   °· You have more swelling or pain at the port site or the surrounding area.   °· You have a fever that is not controlled with medicine. °  °This information is not intended to replace advice given to you by your health care provider. Make sure you discuss any questions you have with your health care provider. °  °Document Released: 08/05/2005 Document Revised: 05/26/2013 Document Reviewed: 04/12/2013 °Elsevier Interactive Patient Education ©2016 [employer] Inc. ° °

## 2015-05-31 ENCOUNTER — Telehealth: Payer: Self-pay | Admitting: *Deleted

## 2015-05-31 NOTE — Telephone Encounter (Signed)
Patient called with "request for diabetes medication refill.  I am out and the other doctor won't answer my call."  This office did not prescribed glycemic agents.  Advised he call the prescriber or Pharmacy to contact prescriber.

## 2015-06-01 ENCOUNTER — Encounter: Payer: Self-pay | Admitting: *Deleted

## 2015-06-01 ENCOUNTER — Telehealth: Payer: Self-pay | Admitting: *Deleted

## 2015-06-01 NOTE — Telephone Encounter (Signed)
This RN spoke with patient and informed him that his letter to return to work is at the front desk with Ms. Wilma. Patient verbalized understanding.

## 2015-06-01 NOTE — Telephone Encounter (Signed)
TC from patient requesting letter to excuse him from work this week and to go back to work on Monday, 06/05/15. 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 He had a port placed on 05/29/15

## 2015-06-07 ENCOUNTER — Other Ambulatory Visit (HOSPITAL_BASED_OUTPATIENT_CLINIC_OR_DEPARTMENT_OTHER): Payer: PPO

## 2015-06-07 ENCOUNTER — Ambulatory Visit: Payer: PPO

## 2015-06-07 ENCOUNTER — Ambulatory Visit (HOSPITAL_BASED_OUTPATIENT_CLINIC_OR_DEPARTMENT_OTHER): Payer: PPO

## 2015-06-07 VITALS — BP 163/73 | HR 94 | Temp 96.7°F | Resp 18

## 2015-06-07 DIAGNOSIS — Z95828 Presence of other vascular implants and grafts: Secondary | ICD-10-CM

## 2015-06-07 DIAGNOSIS — C7951 Secondary malignant neoplasm of bone: Secondary | ICD-10-CM

## 2015-06-07 DIAGNOSIS — C61 Malignant neoplasm of prostate: Secondary | ICD-10-CM | POA: Diagnosis not present

## 2015-06-07 DIAGNOSIS — Z5111 Encounter for antineoplastic chemotherapy: Secondary | ICD-10-CM | POA: Diagnosis not present

## 2015-06-07 LAB — CBC WITH DIFFERENTIAL/PLATELET
BASO%: 0.9 % (ref 0.0–2.0)
Basophils Absolute: 0 10*3/uL (ref 0.0–0.1)
EOS ABS: 0 10*3/uL (ref 0.0–0.5)
EOS%: 0.4 % (ref 0.0–7.0)
HEMATOCRIT: 29.9 % — AB (ref 38.4–49.9)
HEMOGLOBIN: 9.7 g/dL — AB (ref 13.0–17.1)
LYMPH#: 1.4 10*3/uL (ref 0.9–3.3)
LYMPH%: 28.8 % (ref 14.0–49.0)
MCH: 24.9 pg — ABNORMAL LOW (ref 27.2–33.4)
MCHC: 32.4 g/dL (ref 32.0–36.0)
MCV: 76.9 fL — AB (ref 79.3–98.0)
MONO#: 0.3 10*3/uL (ref 0.1–0.9)
MONO%: 6.6 % (ref 0.0–14.0)
NEUT%: 63.3 % (ref 39.0–75.0)
NEUTROS ABS: 3.1 10*3/uL (ref 1.5–6.5)
PLATELETS: 232 10*3/uL (ref 140–400)
RBC: 3.89 10*6/uL — AB (ref 4.20–5.82)
RDW: 14.2 % (ref 11.0–14.6)
WBC: 4.8 10*3/uL (ref 4.0–10.3)

## 2015-06-07 LAB — COMPREHENSIVE METABOLIC PANEL (CC13)
ALBUMIN: 3.4 g/dL — AB (ref 3.5–5.0)
ALT: 9 U/L (ref 0–55)
ANION GAP: 9 meq/L (ref 3–11)
AST: 26 U/L (ref 5–34)
BILIRUBIN TOTAL: 0.32 mg/dL (ref 0.20–1.20)
BUN: 9.8 mg/dL (ref 7.0–26.0)
CO2: 28 meq/L (ref 22–29)
CREATININE: 0.8 mg/dL (ref 0.7–1.3)
Calcium: 10.5 mg/dL — ABNORMAL HIGH (ref 8.4–10.4)
Chloride: 100 mEq/L (ref 98–109)
Glucose: 390 mg/dl — ABNORMAL HIGH (ref 70–140)
Potassium: 4.9 mEq/L (ref 3.5–5.1)
Sodium: 136 mEq/L (ref 136–145)
TOTAL PROTEIN: 7.3 g/dL (ref 6.4–8.3)

## 2015-06-07 MED ORDER — SODIUM CHLORIDE 0.9 % IJ SOLN
10.0000 mL | INTRAMUSCULAR | Status: DC | PRN
  Administered 2015-06-07: 10 mL
  Filled 2015-06-07: qty 10

## 2015-06-07 MED ORDER — SODIUM CHLORIDE 0.9 % IJ SOLN
10.0000 mL | INTRAMUSCULAR | Status: DC | PRN
  Filled 2015-06-07: qty 10

## 2015-06-07 MED ORDER — HEPARIN SOD (PORK) LOCK FLUSH 100 UNIT/ML IV SOLN
500.0000 [IU] | Freq: Once | INTRAVENOUS | Status: AC | PRN
  Administered 2015-06-07: 500 [IU]
  Filled 2015-06-07: qty 5

## 2015-06-07 MED ORDER — SODIUM CHLORIDE 0.9 % IV SOLN
Freq: Once | INTRAVENOUS | Status: AC
  Administered 2015-06-07: 11:00:00 via INTRAVENOUS

## 2015-06-07 MED ORDER — SODIUM CHLORIDE 0.9 % IV SOLN
75.0000 mg/m2 | Freq: Once | INTRAVENOUS | Status: AC
  Administered 2015-06-07: 120 mg via INTRAVENOUS
  Filled 2015-06-07: qty 12

## 2015-06-07 MED ORDER — SODIUM CHLORIDE 0.9 % IV SOLN
Freq: Once | INTRAVENOUS | Status: AC
  Administered 2015-06-07: 11:00:00 via INTRAVENOUS
  Filled 2015-06-07: qty 4

## 2015-06-07 NOTE — Patient Instructions (Addendum)
[facility] Discharge Instructions for Patients Receiving Chemotherapy  Today you received the following chemotherapy agents:  Taxotere  To help prevent nausea and vomiting after your treatment, we encourage you to take your nausea medication as ordered per MD.   If you develop nausea and vomiting that is not controlled by your nausea medication, call the clinic.   BELOW ARE SYMPTOMS THAT SHOULD BE REPORTED IMMEDIATELY:  *FEVER GREATER THAN 100.5 F  *CHILLS WITH OR WITHOUT FEVER  NAUSEA AND VOMITING THAT IS NOT CONTROLLED WITH YOUR NAUSEA MEDICATION  *UNUSUAL SHORTNESS OF BREATH  *UNUSUAL BRUISING OR BLEEDING  TENDERNESS IN MOUTH AND THROAT WITH OR WITHOUT PRESENCE OF ULCERS  *URINARY PROBLEMS  *BOWEL PROBLEMS  UNUSUAL RASH Items with * indicate a potential emergency and should be followed up as soon as possible.  Feel free to call the clinic you have any questions or concerns. The clinic phone number is [phone].  Please show the [facility] at check-in to the Emergency Department and triage nurse.   Docetaxel injection What is this medicine? DOCETAXEL (doe se TAX el) is a chemotherapy drug. It targets fast dividing cells, like cancer cells, and causes these cells to die. This medicine is used to treat many types of cancers like breast cancer, certain stomach cancers, head and neck cancer, lung cancer, and prostate cancer. This medicine may be used for other purposes; ask your health care provider or pharmacist if you have questions. What should I tell my health care provider before I take this medicine? They need to know if you have any of these conditions: -infection (especially a virus infection such as chickenpox, cold sores, or herpes) -liver disease -low blood counts, like low white cell, platelet, or red cell counts -an unusual or allergic reaction to docetaxel, polysorbate 80, other chemotherapy agents, other medicines, foods, dyes, or  preservatives -pregnant or trying to get pregnant -breast-feeding How should I use this medicine? This drug is given as an infusion into a vein. It is administered in a hospital or clinic by a specially trained health care professional. Talk to your pediatrician regarding the use of this medicine in children. Special care may be needed. Overdosage: If you think you have taken too much of this medicine contact a poison control center or emergency room at once. NOTE: This medicine is only for you. Do not share this medicine with others. What if I miss a dose? It is important not to miss your dose. Call your doctor or health care professional if you are unable to keep an appointment. What may interact with this medicine? -cyclosporine -erythromycin -ketoconazole -medicines to increase blood counts like filgrastim, pegfilgrastim, sargramostim -vaccines Talk to your doctor or health care professional before taking any of these medicines: -acetaminophen -aspirin -ibuprofen -ketoprofen -naproxen This list may not describe all possible interactions. Give your health care provider a list of all the medicines, herbs, non-prescription drugs, or dietary supplements you use. Also tell them if you smoke, drink alcohol, or use illegal drugs. Some items may interact with your medicine. What should I watch for while using this medicine? Your condition will be monitored carefully while you are receiving this medicine. You will need important blood work done while you are taking this medicine. This drug may make you feel generally unwell. This is not uncommon, as chemotherapy can affect healthy cells as well as cancer cells. Report any side effects. Continue your course of treatment even though you feel ill unless your doctor  tells you to stop. In some cases, you may be given additional medicines to help with side effects. Follow all directions for their use. Call your doctor or health care professional for  advice if you get a fever, chills or sore throat, or other symptoms of a cold or flu. Do not treat yourself. This drug decreases your body's ability to fight infections. Try to avoid being around people who are sick. This medicine may increase your risk to bruise or bleed. Call your doctor or health care professional if you notice any unusual bleeding. This medicine may contain alcohol in the product. You may get drowsy or dizzy. Do not drive, use machinery, or do anything that needs mental alertness until you know how this medicine affects you. Do not stand or sit up quickly, especially if you are an older patient. This reduces the risk of dizzy or fainting spells. Avoid alcoholic drinks. Do not become pregnant while taking this medicine. Women should inform their doctor if they wish to become pregnant or think they might be pregnant. There is a potential for serious side effects to an unborn child. Talk to your health care professional or pharmacist for more information. Do not breast-feed an infant while taking this medicine. What side effects may I notice from receiving this medicine? Side effects that you should report to your doctor or health care professional as soon as possible: -allergic reactions like skin rash, itching or hives, swelling of the face, lips, or tongue -low blood counts - This drug may decrease the number of white blood cells, red blood cells and platelets. You may be at increased risk for infections and bleeding. -signs of infection - fever or chills, cough, sore throat, pain or difficulty passing urine -signs of decreased platelets or bleeding - bruising, pinpoint red spots on the skin, black, tarry stools, nosebleeds -signs of decreased red blood cells - unusually weak or tired, fainting spells, lightheadedness -breathing problems -fast or irregular heartbeat -low blood pressure -mouth sores -nausea and vomiting -pain, swelling, redness or irritation at the injection  site -pain, tingling, numbness in the hands or feet -swelling of the ankle, feet, hands -weight gain Side effects that usually do not require medical attention (report to your prescriber or health care professional if they continue or are bothersome): -bone pain -complete hair loss including hair on your head, underarms, pubic hair, eyebrows, and eyelashes -diarrhea -excessive tearing -changes in the color of fingernails -loosening of the fingernails -nausea -muscle pain -red flush to skin -sweating -weak or tired This list may not describe all possible side effects. Call your doctor for medical advice about side effects. You may report side effects to FDA at 1-800-FDA-1088. Where should I keep my medicine? This drug is given in a hospital or clinic and will not be stored at home. NOTE: This sheet is a summary. It may not cover all possible information. If you have questions about this medicine, talk to your doctor, pharmacist, or health care provider.    2016, Elsevier/[employer]. (2014-08-22 16:04:57)

## 2015-06-07 NOTE — Progress Notes (Signed)
Patient did not have emla on port.  Stated it is first treatment and did not know what to do but did not want to be accessed without PAC being numb. Patient was shown how to put emla on and labs to be drawn in lab and port accessed in chemo.

## 2015-06-08 ENCOUNTER — Encounter: Payer: Self-pay | Admitting: *Deleted

## 2015-06-08 LAB — PSA: PSA: 206.7 ng/mL — AB (ref <=4.00)

## 2015-06-08 NOTE — Progress Notes (Signed)
Spoke with patient, states he is doing well after his first chemotherapy. Is eating and drinking well. No c/o.

## 2015-06-09 ENCOUNTER — Ambulatory Visit (HOSPITAL_BASED_OUTPATIENT_CLINIC_OR_DEPARTMENT_OTHER): Payer: PPO

## 2015-06-09 VITALS — BP 137/55 | HR 102 | Temp 97.6°F | Resp 20

## 2015-06-09 DIAGNOSIS — C61 Malignant neoplasm of prostate: Secondary | ICD-10-CM

## 2015-06-09 DIAGNOSIS — Z5189 Encounter for other specified aftercare: Secondary | ICD-10-CM

## 2015-06-09 MED ORDER — PEGFILGRASTIM INJECTION 6 MG/0.6ML ~~LOC~~
6.0000 mg | PREFILLED_SYRINGE | Freq: Once | SUBCUTANEOUS | Status: AC
  Administered 2015-06-09: 6 mg via SUBCUTANEOUS
  Filled 2015-06-09: qty 0.6

## 2015-06-09 NOTE — Patient Instructions (Signed)
Pegfilgrastim injection What is this medicine? PEGFILGRASTIM (PEG fil gra stim) is a long-acting granulocyte colony-stimulating factor that stimulates the growth of neutrophils, a type of white blood cell important in the body's fight against infection. It is used to reduce the incidence of fever and infection in patients with certain types of cancer who are receiving chemotherapy that affects the bone marrow, and to increase survival after being exposed to high doses of radiation. This medicine may be used for other purposes; ask your health care provider or pharmacist if you have questions. What should I tell my health care provider before I take this medicine? They need to know if you have any of these conditions: -kidney disease -latex allergy -ongoing radiation therapy -sickle cell disease -skin reactions to acrylic adhesives (On-Body Injector only) -an unusual or allergic reaction to pegfilgrastim, filgrastim, other medicines, foods, dyes, or preservatives -pregnant or trying to get pregnant -breast-feeding How should I use this medicine? This medicine is for injection under the skin. If you get this medicine at home, you will be taught how to prepare and give the pre-filled syringe or how to use the On-body Injector. Refer to the patient Instructions for Use for detailed instructions. Use exactly as directed. Take your medicine at regular intervals. Do not take your medicine more often than directed. It is important that you put your used needles and syringes in a special sharps container. Do not put them in a trash can. If you do not have a sharps container, call your pharmacist or healthcare provider to get one. Talk to your pediatrician regarding the use of this medicine in children. While this drug may be prescribed for selected conditions, precautions do apply. Overdosage: If you think you have taken too much of this medicine contact a poison control center or emergency room at  once. NOTE: This medicine is only for you. Do not share this medicine with others. What if I miss a dose? It is important not to miss your dose. Call your doctor or health care professional if you miss your dose. If you miss a dose due to an On-body Injector failure or leakage, a new dose should be administered as soon as possible using a single prefilled syringe for manual use. What may interact with this medicine? Interactions have not been studied. Give your health care provider a list of all the medicines, herbs, non-prescription drugs, or dietary supplements you use. Also tell them if you smoke, drink alcohol, or use illegal drugs. Some items may interact with your medicine. This list may not describe all possible interactions. Give your health care provider a list of all the medicines, herbs, non-prescription drugs, or dietary supplements you use. Also tell them if you smoke, drink alcohol, or use illegal drugs. Some items may interact with your medicine. What should I watch for while using this medicine? You may need blood work done while you are taking this medicine. If you are going to need a MRI, CT scan, or other procedure, tell your doctor that you are using this medicine (On-Body Injector only). What side effects may I notice from receiving this medicine? Side effects that you should report to your doctor or health care professional as soon as possible: -allergic reactions like skin rash, itching or hives, swelling of the face, lips, or tongue -dizziness -fever -pain, redness, or irritation at site where injected -pinpoint red spots on the skin -red or dark-brown urine -shortness of breath or breathing problems -stomach or side pain, or pain   at the shoulder -swelling -tiredness -trouble passing urine or change in the amount of urine Side effects that usually do not require medical attention (report to your doctor or health care professional if they continue or are  bothersome): -bone pain -muscle pain This list may not describe all possible side effects. Call your doctor for medical advice about side effects. You may report side effects to FDA at 1-800-FDA-1088. Where should I keep my medicine? Keep out of the reach of children. Store pre-filled syringes in a refrigerator between 2 and 8 degrees C (36 and 46 degrees F). Do not freeze. Keep in carton to protect from light. Throw away this medicine if it is left out of the refrigerator for more than 48 hours. Throw away any unused medicine after the expiration date. NOTE: This sheet is a summary. It may not cover all possible information. If you have questions about this medicine, talk to your doctor, pharmacist, or health care provider.    2016, [employer]. (2014-08-25 14:30:14)  

## 2015-06-12 ENCOUNTER — Encounter (HOSPITAL_COMMUNITY): Payer: Self-pay | Admitting: *Deleted

## 2015-06-12 ENCOUNTER — Other Ambulatory Visit: Payer: Self-pay

## 2015-06-12 ENCOUNTER — Observation Stay (HOSPITAL_COMMUNITY)
Admission: EM | Admit: 2015-06-12 | Discharge: 2015-06-14 | Disposition: A | Discharge status: Home / Self Care | Payer: PPO | Attending: Internal Medicine | Admitting: Internal Medicine

## 2015-06-12 ENCOUNTER — Emergency Department (HOSPITAL_COMMUNITY): Payer: PPO

## 2015-06-12 DIAGNOSIS — C61 Malignant neoplasm of prostate: Secondary | ICD-10-CM | POA: Diagnosis present

## 2015-06-12 DIAGNOSIS — Z681 Body mass index (BMI) 19 or less, adult: Secondary | ICD-10-CM | POA: Insufficient documentation

## 2015-06-12 DIAGNOSIS — Z794 Long term (current) use of insulin: Secondary | ICD-10-CM | POA: Diagnosis not present

## 2015-06-12 DIAGNOSIS — I10 Essential (primary) hypertension: Secondary | ICD-10-CM | POA: Insufficient documentation

## 2015-06-12 DIAGNOSIS — Z8673 Personal history of transient ischemic attack (TIA), and cerebral infarction without residual deficits: Secondary | ICD-10-CM | POA: Insufficient documentation

## 2015-06-12 DIAGNOSIS — Z7902 Long term (current) use of antithrombotics/antiplatelets: Secondary | ICD-10-CM | POA: Insufficient documentation

## 2015-06-12 DIAGNOSIS — E43 Unspecified severe protein-calorie malnutrition: Secondary | ICD-10-CM | POA: Insufficient documentation

## 2015-06-12 DIAGNOSIS — Z79899 Other long term (current) drug therapy: Secondary | ICD-10-CM | POA: Diagnosis not present

## 2015-06-12 DIAGNOSIS — C7951 Secondary malignant neoplasm of bone: Secondary | ICD-10-CM | POA: Diagnosis not present

## 2015-06-12 DIAGNOSIS — R609 Edema, unspecified: Secondary | ICD-10-CM

## 2015-06-12 DIAGNOSIS — G459 Transient cerebral ischemic attack, unspecified: Secondary | ICD-10-CM | POA: Diagnosis not present

## 2015-06-12 DIAGNOSIS — E1165 Type 2 diabetes mellitus with hyperglycemia: Secondary | ICD-10-CM | POA: Diagnosis not present

## 2015-06-12 DIAGNOSIS — Z7982 Long term (current) use of aspirin: Secondary | ICD-10-CM | POA: Diagnosis not present

## 2015-06-12 DIAGNOSIS — D649 Anemia, unspecified: Principal | ICD-10-CM | POA: Diagnosis present

## 2015-06-12 DIAGNOSIS — R079 Chest pain, unspecified: Secondary | ICD-10-CM | POA: Insufficient documentation

## 2015-06-12 LAB — BASIC METABOLIC PANEL
Anion gap: 9 (ref 5–15)
BUN: 8 mg/dL (ref 6–20)
CHLORIDE: 96 mmol/L — AB (ref 101–111)
CO2: 27 mmol/L (ref 22–32)
CREATININE: 0.61 mg/dL (ref 0.61–1.24)
Calcium: 9.1 mg/dL (ref 8.9–10.3)
GFR calc Af Amer: >60 mL/min (ref >60)
GLUCOSE: 346 mg/dL — AB (ref 65–99)
POTASSIUM: 3.8 mmol/L (ref 3.5–5.1)
SODIUM: 132 mmol/L — AB (ref 135–145)

## 2015-06-12 LAB — CBC WITH DIFFERENTIAL/PLATELET
BASOS PCT: 1 %
Basophils Absolute: 0.1 10*3/uL (ref 0.0–0.1)
Eosinophils Absolute: 0.1 10*3/uL (ref 0.0–0.7)
Eosinophils Relative: 2 %
HEMATOCRIT: 24.2 % — AB (ref 39.0–52.0)
HEMOGLOBIN: 7.9 g/dL — AB (ref 13.0–17.0)
LYMPHS ABS: 1.7 10*3/uL (ref 0.7–4.0)
LYMPHS PCT: 33 %
MCH: 25 pg — AB (ref 26.0–34.0)
MCHC: 32.6 g/dL (ref 30.0–36.0)
MCV: 76.6 fL — ABNORMAL LOW (ref 78.0–100.0)
MONOS PCT: 8 %
Monocytes Absolute: 0.4 10*3/uL (ref 0.1–1.0)
NEUTROS ABS: 2.8 10*3/uL (ref 1.7–7.7)
Neutrophils Relative %: 56 %
Platelets: 151 10*3/uL (ref 150–400)
RBC: 3.16 MIL/uL — ABNORMAL LOW (ref 4.22–5.81)
RDW: 13.5 % (ref 11.5–15.5)
WBC: 5.1 10*3/uL (ref 4.0–10.5)

## 2015-06-12 LAB — CBG MONITORING, ED: Glucose-Capillary: 450 mg/dL — ABNORMAL HIGH (ref 65–99)

## 2015-06-12 LAB — POC OCCULT BLOOD, ED: Fecal Occult Bld: NEGATIVE

## 2015-06-12 LAB — I-STAT TROPONIN, ED: Troponin i, poc: 0 ng/mL (ref 0.00–0.08)

## 2015-06-12 MED ORDER — ATORVASTATIN CALCIUM 40 MG PO TABS
40.0000 mg | ORAL_TABLET | Freq: Every day | ORAL | Status: DC
  Administered 2015-06-12 – 2015-06-13 (×2): 40 mg via ORAL
  Filled 2015-06-12 (×2): qty 1

## 2015-06-12 MED ORDER — ASPIRIN 81 MG PO CHEW
324.0000 mg | CHEWABLE_TABLET | Freq: Once | ORAL | Status: AC
  Administered 2015-06-12: 324 mg via ORAL
  Filled 2015-06-12: qty 4

## 2015-06-12 MED ORDER — INSULIN ASPART 100 UNIT/ML ~~LOC~~ SOLN
10.0000 [IU] | Freq: Once | SUBCUTANEOUS | Status: AC
  Administered 2015-06-12: 10 [IU] via INTRAVENOUS
  Filled 2015-06-12: qty 1

## 2015-06-12 MED ORDER — LOSARTAN POTASSIUM 50 MG PO TABS: 50.0000 mg | ORAL_TABLET | Freq: Every day | ORAL | Status: DC

## 2015-06-12 MED ORDER — METFORMIN HCL 500 MG PO TABS
1000.0000 mg | ORAL_TABLET | Freq: Two times a day (BID) | ORAL | Status: DC
  Administered 2015-06-12: 1000 mg via ORAL
  Filled 2015-06-12: qty 2

## 2015-06-12 MED ORDER — SODIUM CHLORIDE 0.9 % IV BOLUS (SEPSIS)
1000.0000 mL | Freq: Once | INTRAVENOUS | Status: AC
  Administered 2015-06-12: 1000 mL via INTRAVENOUS

## 2015-06-12 MED ORDER — GLIPIZIDE 10 MG PO TABS
10.0000 mg | ORAL_TABLET | Freq: Every day | ORAL | Status: DC
  Filled 2015-06-12: qty 1

## 2015-06-12 NOTE — ED Notes (Signed)
Pt is here with chest pain, dizziness, near syncope, and achy.  Pt states all symptoms started today

## 2015-06-12 NOTE — ED Provider Notes (Signed)
CSN: [id number]     Arrival date & time 06/12/15  1627 History   First MD Initiated Contact with Patient 06/12/15 1916     Chief Complaint  Patient presents with  . Chest Pain  . Dizziness     (Consider location/radiation/quality/duration/timing/severity/associated sxs/prior Treatment) HPI   Ricky Terry is a 69 y.o. male, pt with current prostate cancer and history of HTN, DM and CVA, presents with chest pain that began about 12 hours ago while pt was at rest. Accompanied by dizziness. Currently 5/10, aching, non-radiating. Pt states this has happened before about two years ago, was evaluated at [facility], and told that the pain was "due to stress."  Denies N/V, fever/chills, cough, shortness of breath, or any other pain or complaints. Pt last chemo treatment was three days ago and radiation five days ago.  Denies hematochezia.     Past Medical History  Diagnosis Date  . Stroke ([place])   . Diabetes mellitus   . Hypertension   . Prostate cancer [facility])    Past Surgical History  Procedure Laterality Date  . Eye surgery     Family History  Problem Relation Age of Onset  . Cancer Mother   . Hypertension Mother   . Hypertension Father   . Cancer Father   . Diabetes Sister   . Hypertension Sister   . Hypertension Brother   . Diabetes Brother    Social History  Substance Use Topics  . Smoking status: Never Smoker   . Smokeless tobacco: None  . Alcohol Use: No    Review of Systems  Constitutional: Negative for fever, chills, diaphoresis and unexpected weight change.  Respiratory: Negative for cough and shortness of breath.   Cardiovascular: Positive for chest pain. Negative for palpitations and leg swelling.  Gastrointestinal: Negative for nausea, vomiting, abdominal pain, diarrhea and constipation.  Genitourinary: Negative for dysuria and flank pain.  Musculoskeletal: Negative for back pain.  Skin: Negative for color change and pallor.  Neurological: Negative for  dizziness, syncope, weakness and light-headedness.  All other systems reviewed and are negative.     Allergies  Review of patient's allergies indicates no known allergies.  Home Medications   Prior to Admission medications   Medication Sig Start Date End Date Taking? Authorizing Provider  acetaminophen (TYLENOL) 500 MG tablet Take 1,000 mg by mouth every 6 (six) hours as needed (for pain.).   Yes Historical Provider, MD  aspirin 81 MG chewable tablet Chew 81 mg by mouth daily.   Yes Historical Provider, MD  atorvastatin (LIPITOR) 40 MG tablet Take 40 mg by mouth at bedtime.   Yes Historical Provider, MD  cholecalciferol (VITAMIN D) 400 UNITS TABS tablet Take 400 Units by mouth daily.   Yes Historical Provider, MD  clopidogrel (PLAVIX) 75 MG tablet Take 1 tablet (75 mg total) by mouth daily with breakfast. 04/25/13  Yes Theodis Blaze, MD  glipiZIDE (GLUCOTROL) 10 MG tablet Take 10 mg by mouth daily before breakfast.   Yes Historical Provider, MD  lidocaine-prilocaine (EMLA) cream Apply 1 application topically as needed. 05/23/15  Yes Wyatt Portela, MD  losartan (COZAAR) 50 MG tablet Take 50 mg by mouth daily.   Yes Historical Provider, MD  metFORMIN (GLUCOPHAGE) 1000 MG tablet Take 1,000 mg by mouth 2 (two) times daily.   Yes Historical Provider, MD  diazepam (VALIUM) 5 MG tablet Take 1 tablet (5 mg total) by mouth every 12 (twelve) hours as needed for anxiety or muscle spasms. Patient  not taking: Reported on 05/29/2015 02/09/15   Domenic Moras, PA-C  HYDROcodone-acetaminophen (NORCO/VICODIN) 5-325 MG per tablet Take 1-2 tablets by mouth every 6 (six) hours as needed. 12/19/14   Dahlia Bailiff, PA-C  oxyCODONE (ROXICODONE) 5 MG immediate release tablet Take 0.5 tablets (2.5 mg total) by mouth every 4 (four) hours as needed for severe pain. 04/10/15   Deno Etienne, DO  prochlorperazine (COMPAZINE) 10 MG tablet Take 1 tablet (10 mg total) by mouth every 6 (six) hours as needed for nausea or vomiting. 05/23/15    Wyatt Portela, MD   BP 155/59 mmHg  Pulse 118  Temp(Src) 97.4 F (36.3 C) (Oral)  Resp 16  SpO2 97% Physical Exam  Constitutional: He appears well-developed and well-nourished. No distress.  HENT:  Head: Normocephalic and atraumatic.  Eyes: Conjunctivae are normal. Pupils are equal, round, and reactive to light.  Cardiovascular: Regular rhythm, normal heart sounds and intact distal pulses.  Tachycardia present.   Pulmonary/Chest: Effort normal and breath sounds normal. No respiratory distress. He exhibits no tenderness, no crepitus and no deformity.  Abdominal: Soft. Bowel sounds are normal.  Musculoskeletal: He exhibits no edema or tenderness.  Lymphadenopathy:    He has no cervical adenopathy.  Neurological: He is alert.  Skin: Skin is warm and dry. He is not diaphoretic.  Nursing note and vitals reviewed.   ED Course  Procedures (including critical care time) Labs Review Labs Reviewed  BASIC METABOLIC PANEL - Abnormal; Notable for the following:    Sodium 132 (*)    Chloride 96 (*)    Glucose, Bld 346 (*)    All other components within normal limits  CBC WITH DIFFERENTIAL/PLATELET - Abnormal; Notable for the following:    RBC 3.16 (*)    Hemoglobin 7.9 (*)    HCT 24.2 (*)    MCV 76.6 (*)    MCH 25.0 (*)    All other components within normal limits  CBG MONITORING, ED - Abnormal; Notable for the following:    Glucose-Capillary 450 (*)    All other components within normal limits  I-STAT TROPOININ, ED  POC OCCULT BLOOD, ED  TYPE AND SCREEN  PREPARE RBC (CROSSMATCH)    Imaging Review Dg Chest 2 View  06/12/2015  CLINICAL DATA:  69 year old male with syncope and chest pain for the past day. Past history of prostate cancer. EXAM: CHEST  2 VIEW COMPARISON:  Prior chest x-ray 02/09/2015 FINDINGS: Cardiac and mediastinal contours remain within normal limits. Atherosclerotic calcification again noted in the transverse aorta. Single-lumen power injectable port catheter  via a right IJ approach. Catheter tip projects over the distal SVC. No pneumothorax, pulmonary edema or other airspace opacity. The lungs are clear. Irregular sclerosis in the left humeral head consistent with known bony metastatic disease. IMPRESSION: 1. No active cardiopulmonary disease. 2. The tip of the right IJ approach port a catheter projects over the distal SVC. 3. Mottled sclerosis in the left humeral head consistent with known osseous metastatic disease. Electronically Signed   By: Jacqulynn Cadet M.D.   On: 06/12/2015 16:55   I have personally reviewed and evaluated these images and lab results as part of my medical decision-making.   EKG Interpretation   Date/Time:  Monday June 12 2015 16:35:58 EDT Ventricular Rate:  114 PR Interval:  122 QRS Duration: 76 QT Interval:  320 QTC Calculation: 441 R Axis:   77 Text Interpretation:  Sinus tachycardia Possible Left atrial enlargement  Borderline ECG Confirmed by BEATON  MD,  ROBERT [phone]) on 06/12/2015  7:28:57 PM      MDM   Final diagnoses:  Chest pain, unspecified chest pain type  Anemia, unspecified anemia type    Timoteo Expose presents with retrosternal chest pain with a single episode of dizziness.   Findings and plan of care discussed with Leonard Schwartz, MD.  Pt has a low Wells criteria score and a low HEART score. Glucose is 450. Pt is not toxic appearing. Fluid bolus ordered to treat dehydration and hyperglycemia. Will reevaluate pt after each bolus. EKG shows sinus tachycardia. IV team asked to evaluate pt to see if his port can be accessed for fluids and medications.  8:53 PM Pt reevaluated. States he feels much better and his pain has dropped to 3/10. Pt still has not received his IV fluids. Labs still in process. Troponin negative. CBC shows Hgb of 7.9, down from 9.7 on 10/19. Pt denies hematochezia. Hemocult negative. Research of pt chemo drug, Docetaxel, indicates anemia is a known side effect. This anemia  could be causing pt chest pain and fatigue. Pt states his pain has gone down since his arrival.   Leonard Schwartz, MD spoke on the phone with the on call oncologist, who recommended that pt be admitted and transfused 2 units. Also spoke with hospitalist, Dr. Marlowe Sax, who agreed to admit pt to telemetry.  Internal medicine service to handle transfusion.   HEMOGLOBIN  Date Value Ref Range Status  06/12/2015 7.9* 13.0 - 17.0 g/dL Final  05/29/2015 9.8* 13.0 - 17.0 g/dL Final  04/10/2015 9.8* 13.0 - 17.0 g/dL Final  02/09/2015 10.2* 13.0 - 17.0 g/dL Final   HGB  Date Value Ref Range Status  06/07/2015 9.7* 13.0 - 17.1 g/dL Final     Lorayne Bender, PA-C 06/12/15 2334  Leonard Schwartz, MD 06/12/15 [phone]

## 2015-06-12 NOTE — ED Notes (Signed)
IV team here 

## 2015-06-12 NOTE — ED Notes (Signed)
Pt did not answer when called to reassess his vital signs

## 2015-06-12 NOTE — ED Notes (Signed)
CBG completed in triage

## 2015-06-12 NOTE — ED Notes (Signed)
Pt remains monitored by blood pressure, pulse ox, and 5 lead.  

## 2015-06-12 NOTE — ED Notes (Signed)
Pt placed into gown and on monitor upon arrival to room. Pt monitored by blood pressure, 5 lead, and pulse ox.

## 2015-06-13 ENCOUNTER — Observation Stay (HOSPITAL_BASED_OUTPATIENT_CLINIC_OR_DEPARTMENT_OTHER): Payer: PPO

## 2015-06-13 ENCOUNTER — Observation Stay (HOSPITAL_COMMUNITY): Payer: PPO

## 2015-06-13 DIAGNOSIS — R079 Chest pain, unspecified: Secondary | ICD-10-CM | POA: Diagnosis not present

## 2015-06-13 DIAGNOSIS — C61 Malignant neoplasm of prostate: Secondary | ICD-10-CM | POA: Diagnosis not present

## 2015-06-13 DIAGNOSIS — D649 Anemia, unspecified: Secondary | ICD-10-CM | POA: Diagnosis present

## 2015-06-13 DIAGNOSIS — E118 Type 2 diabetes mellitus with unspecified complications: Secondary | ICD-10-CM | POA: Diagnosis not present

## 2015-06-13 DIAGNOSIS — I10 Essential (primary) hypertension: Secondary | ICD-10-CM | POA: Diagnosis not present

## 2015-06-13 LAB — CBC WITH DIFFERENTIAL/PLATELET
Basophils Absolute: 0.1 10*3/uL (ref 0.0–0.1)
Basophils Relative: 1 %
EOS PCT: 2 %
Eosinophils Absolute: 0.1 10*3/uL (ref 0.0–0.7)
HEMATOCRIT: 23.2 % — AB (ref 39.0–52.0)
HEMOGLOBIN: 7.5 g/dL — AB (ref 13.0–17.0)
Lymphocytes Relative: 28 %
Lymphs Abs: 1.7 10*3/uL (ref 0.7–4.0)
MCH: 24.9 pg — AB (ref 26.0–34.0)
MCHC: 32.3 g/dL (ref 30.0–36.0)
MCV: 77.1 fL — ABNORMAL LOW (ref 78.0–100.0)
MONO ABS: 0.5 10*3/uL (ref 0.1–1.0)
MONOS PCT: 9 %
NEUTROS ABS: 3.7 10*3/uL (ref 1.7–7.7)
Neutrophils Relative %: 60 %
Platelets: 142 10*3/uL — ABNORMAL LOW (ref 150–400)
RBC: 3.01 MIL/uL — ABNORMAL LOW (ref 4.22–5.81)
RDW: 13.6 % (ref 11.5–15.5)
WBC Morphology: INCREASED
WBC: 6.1 10*3/uL (ref 4.0–10.5)

## 2015-06-13 LAB — HEMOGLOBIN AND HEMATOCRIT, BLOOD
HCT: 31.4 % — ABNORMAL LOW (ref 39.0–52.0)
HEMOGLOBIN: 10.4 g/dL — AB (ref 13.0–17.0)

## 2015-06-13 LAB — COMPREHENSIVE METABOLIC PANEL
ALK PHOS: 613 U/L — AB (ref 38–126)
ALT: 13 U/L — ABNORMAL LOW (ref 17–63)
AST: 22 U/L (ref 15–41)
Albumin: 2.7 g/dL — ABNORMAL LOW (ref 3.5–5.0)
Anion gap: 8 (ref 5–15)
BILIRUBIN TOTAL: 0.5 mg/dL (ref 0.3–1.2)
BUN: 9 mg/dL (ref 6–20)
CO2: 26 mmol/L (ref 22–32)
CREATININE: 0.71 mg/dL (ref 0.61–1.24)
Calcium: 8.8 mg/dL — ABNORMAL LOW (ref 8.9–10.3)
Chloride: 100 mmol/L — ABNORMAL LOW (ref 101–111)
GLUCOSE: 362 mg/dL — AB (ref 65–99)
POTASSIUM: 3.2 mmol/L — AB (ref 3.5–5.1)
Sodium: 134 mmol/L — ABNORMAL LOW (ref 135–145)
TOTAL PROTEIN: 5.1 g/dL — AB (ref 6.5–8.1)

## 2015-06-13 LAB — PROTIME-INR
INR: 1.18 (ref 0.00–1.49)
Prothrombin Time: 15.2 seconds (ref 11.6–15.2)

## 2015-06-13 LAB — D-DIMER, QUANTITATIVE: D-Dimer, Quant: 12.36 ug/mL-FEU — ABNORMAL HIGH (ref 0.00–0.48)

## 2015-06-13 LAB — PREPARE RBC (CROSSMATCH)

## 2015-06-13 LAB — GLUCOSE, CAPILLARY
GLUCOSE-CAPILLARY: 371 mg/dL — AB (ref 65–99)
Glucose-Capillary: 196 mg/dL — ABNORMAL HIGH (ref 65–99)
Glucose-Capillary: 252 mg/dL — ABNORMAL HIGH (ref 65–99)
Glucose-Capillary: 149 mg/dL — ABNORMAL HIGH (ref 65–99)
Glucose-Capillary: 316 mg/dL — ABNORMAL HIGH (ref 65–99)

## 2015-06-13 LAB — TROPONIN I
TROPONIN I: 0.04 ng/mL — AB (ref <0.031)
Troponin I: 0.06 ng/mL — ABNORMAL HIGH (ref <0.031)
Troponin I: 0.03 ng/mL (ref <0.031)

## 2015-06-13 LAB — ABO/RH: ABO/RH(D): A POS

## 2015-06-13 MED ORDER — LOSARTAN POTASSIUM 50 MG PO TABS
50.0000 mg | ORAL_TABLET | Freq: Every day | ORAL | Status: DC
  Administered 2015-06-13 – 2015-06-14 (×2): 50 mg via ORAL
  Filled 2015-06-13 (×2): qty 1

## 2015-06-13 MED ORDER — INSULIN ASPART 100 UNIT/ML ~~LOC~~ SOLN
0.0000 [IU] | Freq: Every day | SUBCUTANEOUS | Status: DC
  Administered 2015-06-13: 4 [IU] via SUBCUTANEOUS
  Administered 2015-06-13: 5 [IU] via SUBCUTANEOUS

## 2015-06-13 MED ORDER — POTASSIUM CHLORIDE CRYS ER 20 MEQ PO TBCR
30.0000 meq | EXTENDED_RELEASE_TABLET | Freq: Once | ORAL | Status: AC
  Administered 2015-06-13: 30 meq via ORAL
  Filled 2015-06-13 (×2): qty 1

## 2015-06-13 MED ORDER — SODIUM CHLORIDE 0.9 % IJ SOLN
10.0000 mL | INTRAMUSCULAR | Status: DC | PRN
  Administered 2015-06-13 – 2015-06-14 (×3): 10 mL
  Filled 2015-06-13 (×3): qty 40

## 2015-06-13 MED ORDER — IOHEXOL 350 MG/ML SOLN
80.0000 mL | Freq: Once | INTRAVENOUS | Status: AC | PRN
  Administered 2015-06-13: 80 mL via INTRAVENOUS

## 2015-06-13 MED ORDER — SODIUM CHLORIDE 0.9 % IV SOLN
Freq: Once | INTRAVENOUS | Status: AC
  Administered 2015-06-13: 09:00:00 via INTRAVENOUS

## 2015-06-13 MED ORDER — INSULIN ASPART 100 UNIT/ML ~~LOC~~ SOLN
0.0000 [IU] | Freq: Three times a day (TID) | SUBCUTANEOUS | Status: DC
  Administered 2015-06-13: 3 [IU] via SUBCUTANEOUS
  Administered 2015-06-13: 8 [IU] via SUBCUTANEOUS
  Administered 2015-06-13: 2 [IU] via SUBCUTANEOUS
  Administered 2015-06-14: 11 [IU] via SUBCUTANEOUS

## 2015-06-13 MED ORDER — ONDANSETRON HCL 4 MG/2ML IJ SOLN: 4.0000 mg | Freq: Four times a day (QID) | INTRAMUSCULAR | Status: DC | PRN

## 2015-06-13 MED ORDER — ACETAMINOPHEN 325 MG PO TABS: 650.0000 mg | ORAL_TABLET | ORAL | Status: DC | PRN

## 2015-06-13 MED ORDER — GLUCERNA SHAKE PO LIQD
237.0000 mL | Freq: Three times a day (TID) | ORAL | Status: DC
  Administered 2015-06-13 – 2015-06-14 (×2): 237 mL via ORAL

## 2015-06-13 MED ORDER — ZOLPIDEM TARTRATE 5 MG PO TABS
5.0000 mg | ORAL_TABLET | Freq: Every evening | ORAL | Status: DC | PRN
  Administered 2015-06-13: 5 mg via ORAL
  Filled 2015-06-13: qty 1

## 2015-06-13 MED ORDER — ZOLPIDEM TARTRATE 5 MG PO TABS
5.0000 mg | ORAL_TABLET | Freq: Once | ORAL | Status: AC
  Administered 2015-06-13: 5 mg via ORAL
  Filled 2015-06-13: qty 1

## 2015-06-13 MED ORDER — HYDRALAZINE HCL 20 MG/ML IJ SOLN: 10.0000 mg | Freq: Four times a day (QID) | INTRAMUSCULAR | Status: DC | PRN

## 2015-06-13 MED ORDER — PRO-STAT SUGAR FREE PO LIQD
30.0000 mL | Freq: Two times a day (BID) | ORAL | Status: DC
  Administered 2015-06-13 – 2015-06-14 (×2): 30 mL via ORAL
  Filled 2015-06-13 (×2): qty 30

## 2015-06-13 MED ORDER — OXYCODONE HCL 5 MG PO TABS: 5.0000 mg | ORAL_TABLET | Freq: Four times a day (QID) | ORAL | Status: DC | PRN

## 2015-06-13 MED ORDER — SODIUM CHLORIDE 0.9 % IV BOLUS (SEPSIS): 500.0000 mL | Freq: Once | INTRAVENOUS | Status: DC

## 2015-06-13 NOTE — Progress Notes (Signed)
Initial Nutrition Assessment  DOCUMENTATION CODES:   Severe malnutrition in context of chronic illness  INTERVENTION:  Provide Glucerna Shakes po TID until glucose improves (then Ensure Enlive), each supplement provides 220kcal and 10 grams of protein Provide Snacks TID Provide Pro-stat BID, each dose provides 100 kcal and 15 grams of protein Provided and discussed "High Calorie, High Protein Nutrition Therapy" handout from the Academy of Nutrition and Dietetics   NUTRITION DIAGNOSIS:   Malnutrition related to cancer and cancer related treatments as evidenced by percent weight loss, moderate depletion of body fat, severe depletion of muscle mass, energy intake < or equal to 75% for > or equal to 1 month.   GOAL:   Patient will meet greater than or equal to 90% of their needs   MONITOR:   PO intake, Supplement acceptance, Weight trends, Labs, Skin  REASON FOR ASSESSMENT:   Malnutrition Screening Tool    ASSESSMENT:   70 y.o. male with Past medical history of prostate cancer recently started on chemotherapy, CVA, diabetes mellitus, hypertension, shoulder metastasis.Patient presents with complaints of chest pain.  Pt states that prior to starting chemotherapy he was weighing 155 lbs. Since started chemotherapy, he has not had much of an appetite and has been eating about 25-50% compared to usual; half a sandwich and soup for lunch and crackers for dinner. Per nursing notes, pt ate 75% of breakfast today. Patient has lost 21% of his body weight in less than 3 months; per nutrition-focused physical exam, patient has severe muscle wasting and moderate fat wasting. Pt meets nutrition criteria for severe malnutrition in the context of chronic illness.  RD dicussed ways to increase calore and protein intake. Encouraged consuming frequent meals and snacks, encouraged drinking calorie-rich beverages, and discussed ways to increase calorie content of meals and snacks. Pt agreeable to  receiving Ensure Enlive and snacks.   Labs: low hemoglobin, elevated glucose, low potassium  Diet Order:  Diet Carb Modified Fluid consistency:: Thin; Room service appropriate?: Yes  Skin:  Reviewed, no issues  Last BM:  10/24  Height:   Ht Readings from Last 1 Encounters:  06/13/15 5\' 6"  (1.676 m)    Weight:   Wt Readings from Last 1 Encounters:  06/13/15 122 lb 14.4 oz (55.747 kg)    Ideal Body Weight:  64.5 kg  BMI:  Body mass index is 19.85 kg/(m^2).  Estimated Nutritional Needs:   Kcal:  1800-2000  Protein:  70-80 grams  Fluid:  1.8-2L/day  EDUCATION NEEDS:   Education needs addressed  Scarlette Ar RD, LDN Inpatient Clinical Dietitian Pager: [phone] After Hours Pager: [phone]

## 2015-06-13 NOTE — Progress Notes (Signed)
Inpatient Diabetes Program Recommendations  AACE/ADA: New Consensus Statement on Inpatient Glycemic Control (2015)  Target Ranges:  Prepandial:   less than 140 mg/dL      Peak postprandial:   less than 180 mg/dL (1-2 hours)      Critically ill patients:  140 - 180 mg/dL   Review of Glycemic Control:  Results for Ricky Terry, Ricky Terry (MRN [id number]) as of 06/13/2015 11:14  Ref. Range 06/12/2015 16:39 06/13/2015 01:20 06/13/2015 06:08 06/13/2015 11:03  Glucose-Capillary Latest Ref Range: 65-99 mg/dL 450 (H) 371 (H) 196 (H) 252 (H)   Diabetes history: Diabetes Mellitus Outpatient Diabetes medications: Glipizide 10 mg daily, Metformin 1000 mg bid-Patient admits that he ran out of 2 weeks earlier Current orders for Inpatient glycemic control:  Novolog moderate tid with meals and HS  Inpatient Diabetes Program Recommendations:    Note that blood sugars are much higher than goals.  A1C pending however since patient is anemic, this value may not be accurate.  May consider adding Levemir 10 units daily while in the hospital.    Thanks, Adah Perl, RN, BC-ADM Inpatient Diabetes Coordinator Pager [phone] (8a-5p)

## 2015-06-13 NOTE — H&P (Signed)
[facility] History and Physical  Patient: Ricky Terry  MRN: [id number]  DOB: 1945-12-08  DOS: the patient was seen and examined on 06/13/2015 PCP: [name], MD  Referring physician: Dr. Audie Pinto Chief Complaint: Shortness of breath and chest pain with dizziness  HPI: Ricky Terry is a 69 y.o. male with Past medical history of prostate cancer recently started on chemotherapy, CVA, diabetes mellitus, hypertension, shoulder metastasis. Patient presents with complaints of chest pain. Patient also had some dizziness and lightheadedness. Next and is also started today. Next and patient denies having any similar symptoms in the past. If I 42 over last couple of days. The patient has recently started on chemotherapy denies any nausea vomiting or active bleeding or black color bowel movement. He denies any burning urination. After the chemotherapy denies any recent changes in his medication.  The patient is coming from home.  At his baseline ambulates without support And is independent for most of his ADL; manages his medication on his own.  Review of Systems: as mentioned in the history of present illness.  A comprehensive review of the other systems is negative.  Past Medical History  Diagnosis Date  . Stroke ([place])   . Diabetes mellitus   . Hypertension   . Prostate cancer [facility])    Past Surgical History  Procedure Laterality Date  . Eye surgery     Social History:  reports that he has never smoked. He does not have any smokeless tobacco history on file. He reports that he does not drink alcohol or use illicit drugs.  No Known Allergies  Family History  Problem Relation Age of Onset  . Cancer Mother   . Hypertension Mother   . Hypertension Father   . Cancer Father   . Diabetes Sister   . Hypertension Sister   . Hypertension Brother   . Diabetes Brother     Prior to Admission medications   Medication Sig Start Date End Date Taking? Authorizing Provider   acetaminophen (TYLENOL) 500 MG tablet Take 1,000 mg by mouth every 6 (six) hours as needed (for pain.).   Yes Historical Provider, MD  aspirin 81 MG chewable tablet Chew 81 mg by mouth daily.   Yes Historical Provider, MD  atorvastatin (LIPITOR) 40 MG tablet Take 40 mg by mouth at bedtime.   Yes Historical Provider, MD  cholecalciferol (VITAMIN D) 400 UNITS TABS tablet Take 400 Units by mouth daily.   Yes Historical Provider, MD  clopidogrel (PLAVIX) 75 MG tablet Take 1 tablet (75 mg total) by mouth daily with breakfast. 04/25/13  Yes Theodis Blaze, MD  glipiZIDE (GLUCOTROL) 10 MG tablet Take 10 mg by mouth daily before breakfast.   Yes Historical Provider, MD  lidocaine-prilocaine (EMLA) cream Apply 1 application topically as needed. 05/23/15  Yes Wyatt Portela, MD  losartan (COZAAR) 50 MG tablet Take 50 mg by mouth daily.   Yes Historical Provider, MD  metFORMIN (GLUCOPHAGE) 1000 MG tablet Take 1,000 mg by mouth 2 (two) times daily.   Yes Historical Provider, MD  diazepam (VALIUM) 5 MG tablet Take 1 tablet (5 mg total) by mouth every 12 (twelve) hours as needed for anxiety or muscle spasms. Patient not taking: Reported on 05/29/2015 02/09/15   Domenic Moras, PA-C  HYDROcodone-acetaminophen (NORCO/VICODIN) 5-325 MG per tablet Take 1-2 tablets by mouth every 6 (six) hours as needed. 12/19/14   Dahlia Bailiff, PA-C  oxyCODONE (ROXICODONE) 5 MG immediate release tablet Take 0.5 tablets (2.5 mg total) by mouth  every 4 (four) hours as needed for severe pain. 04/10/15   Deno Etienne, DO  prochlorperazine (COMPAZINE) 10 MG tablet Take 1 tablet (10 mg total) by mouth every 6 (six) hours as needed for nausea or vomiting. 05/23/15   Wyatt Portela, MD    Physical Exam: Filed Vitals:   06/13/15 0030 06/13/15 0125 06/13/15 0317 06/13/15 0349  BP: 145/52 134/51 146/64 149/64  Pulse: 111 112 108 103  Temp:  97.8 F (36.6 C) 98 F (36.7 C) 98 F (36.7 C)  TempSrc:  Oral Oral Oral  Resp: 22 20 20 18   Height:  5\' 6"   (1.676 m)    Weight:  55.747 kg (122 lb 14.4 oz)    SpO2: 100% 100% 100% 100%    General: Alert, Awake and Oriented to Time, Place and Person. Appear in mild distress Eyes: PERRL ENT: Oral Mucosa clear moist. Neck: no JVD Cardiovascular: S1 and S2 Present, no Murmur, Peripheral Pulses Present Respiratory: Bilateral Air entry equal and Decreased,  Clear to Auscultation, no Crackles, no wheezes Abdomen: Bowel Sound present, Soft and no tenderness Skin: no Rash Extremities: no Pedal edema, no calf tenderness Neurologic: Grossly no focal neuro deficit.  Labs on Admission:  CBC:  Recent Labs Lab 06/07/15 0911 06/12/15 2108 06/13/15 0320  WBC 4.8 5.1 6.1  NEUTROABS 3.1 2.8 PENDING  HGB 9.7* 7.9* 7.5*  HCT 29.9* 24.2* 23.2*  MCV 76.9* 76.6* 77.1*  PLT 232 151 142*    CMP     Component Value Date/Time   NA 134* 06/13/2015 0320   NA 136 06/07/2015 0911   K 3.2* 06/13/2015 0320   K 4.9 06/07/2015 0911   CL 100* 06/13/2015 0320   CO2 26 06/13/2015 0320   CO2 28 06/07/2015 0911   GLUCOSE 362* 06/13/2015 0320   GLUCOSE 390* 06/07/2015 0911   BUN 9 06/13/2015 0320   BUN 9.8 06/07/2015 0911   CREATININE 0.71 06/13/2015 0320   CREATININE 0.8 06/07/2015 0911   CALCIUM 8.8* 06/13/2015 0320   CALCIUM 10.5* 06/07/2015 0911   PROT 5.1* 06/13/2015 0320   PROT 7.3 06/07/2015 0911   ALBUMIN 2.7* 06/13/2015 0320   ALBUMIN 3.4* 06/07/2015 0911   AST 22 06/13/2015 0320   AST 26 06/07/2015 0911   ALT 13* 06/13/2015 0320   ALT 9 06/07/2015 0911   ALKPHOS 613* 06/13/2015 0320   ALKPHOS 1,250* 06/07/2015 0911   BILITOT 0.5 06/13/2015 0320   BILITOT 0.32 06/07/2015 0911   GFRNONAA >60 06/13/2015 0320   GFRAA >60 06/13/2015 0320     Recent Labs Lab 06/13/15 0321  TROPONINI 0.06*   BNP (last 3 results) No results for input(s): BNP in the last 8760 hours.  ProBNP (last 3 results) No results for input(s): PROBNP in the last 8760 hours.   Radiological Exams on  Admission: Dg Chest 2 View  06/12/2015  CLINICAL DATA:  69 year old male with syncope and chest pain for the past day. Past history of prostate cancer. EXAM: CHEST  2 VIEW COMPARISON:  Prior chest x-ray 02/09/2015 FINDINGS: Cardiac and mediastinal contours remain within normal limits. Atherosclerotic calcification again noted in the transverse aorta. Single-lumen power injectable port catheter via a right IJ approach. Catheter tip projects over the distal SVC. No pneumothorax, pulmonary edema or other airspace opacity. The lungs are clear. Irregular sclerosis in the left humeral head consistent with known bony metastatic disease. IMPRESSION: 1. No active cardiopulmonary disease. 2. The tip of the right IJ approach port a catheter projects  over the distal SVC. 3. Mottled sclerosis in the left humeral head consistent with known osseous metastatic disease. Electronically Signed   By: Jacqulynn Cadet M.D.   On: 06/12/2015 16:55   EKG: Independently reviewed. nonspecific ST and T waves changes, sinus tachycardia.  Assessment/Plan 1. Symptomatic anemia Patient presents with complaints of chest pain as well as dizziness. EKG shows sinus tachycardia. He workup shows that the patient has mild elevation of serum troponin and also has a significant drop in his H&H. This is most likely chemotherapy induced. With this the patient was discussed with hematology oncology who recommended the blood transfusion. The patient was given 2 units of PRBC. We'll check her H&H and. Monitor for bleeding. Next Hemoccult negative.  2. TIA (transient ischemic attack) History of TIA. In the setting of significant anemia currently holding aspirin and Plavix should resume once active bleeding is ruled out.  3  DM (diabetes mellitus) ([place]) Patient is currently hyperglycemic. Excellent patient mentions he has been out of his medication for last 2 weeks due to financial issue. Next and with him on sliding scale insulin  currently. Holding oral hypoglycemic agent.  4  HTN (hypertension) Blood pressure currently stable. We'll continue monitoring.  5  Prostate cancer [facility]) Patient has non-treatable prostate cancer. Patient is currently on palliative chemotherapy and radiation. Continue close monitoring. Pain management as needed.  Nutrition: Regular diet DVT Prophylaxis: subcutaneous Heparin  Advance goals of care discussion: Full code   Consults: Oncology  Disposition: Admitted as observation  telemetry unit.  Author: Berle Mull, MD [facility] Pager: [phone] 06/13/2015  If 7PM-7AM, please contact night-coverage [email or web address] Password [email or web address]

## 2015-06-13 NOTE — Progress Notes (Addendum)
  Patient admitted earlier this morning. H&P reviewed. Patient seen and examined.  S; Patient denies any chest pain or shortness of breath currently. No dizziness or lightheadedness.  O: Vital signs reviewed. Blood pressure noted to be elevated.  Lungs are clear to auscultation bilaterally. No wheezing, rales or rhonchi S1, S2 is normal, regular. No S3, S4. No rubs, murmurs or bruits Abdomen is soft, nontender, nondistended  Initial troponin was 0.06. Subsequent one is normal. D-dimer is pending. Posttransfusion hemoglobin is pending.  A/P: Chest pain thought to be secondary to anemia. D-dimer is pending as the patient does have history of cancer. However, his pain has resolved. Repeat troponin is normal. Echocardiogram is pending. Continue to cycle troponins. CT angiogram to be done if D-dimer is abnormal. Resume his antihypertensive agent.  Further plan as outlined in the H&P.  Ricky Terry 06/13/2015 1:14 PM  ADDENDUM CT Angio Chest results reviewed. 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 Patient denies any LE weakness or back pain. Discussed with Dr. Alen Blew as well. He will discuss radiation treatment with patient at follow up. But no urgent indication for same. No PE noted. Await LE venous dopplers.  Ricky Terry 4:51 PM

## 2015-06-13 NOTE — Progress Notes (Signed)
  Echocardiogram 2D Echocardiogram has been performed.  Ricky Terry 06/13/2015, 10:40 AM

## 2015-06-14 ENCOUNTER — Observation Stay (HOSPITAL_COMMUNITY): Payer: PPO

## 2015-06-14 DIAGNOSIS — E118 Type 2 diabetes mellitus with unspecified complications: Secondary | ICD-10-CM | POA: Diagnosis not present

## 2015-06-14 DIAGNOSIS — E43 Unspecified severe protein-calorie malnutrition: Secondary | ICD-10-CM | POA: Insufficient documentation

## 2015-06-14 DIAGNOSIS — I10 Essential (primary) hypertension: Secondary | ICD-10-CM | POA: Diagnosis not present

## 2015-06-14 DIAGNOSIS — T451X5A Adverse effect of antineoplastic and immunosuppressive drugs, initial encounter: Secondary | ICD-10-CM

## 2015-06-14 DIAGNOSIS — C61 Malignant neoplasm of prostate: Secondary | ICD-10-CM

## 2015-06-14 DIAGNOSIS — G459 Transient cerebral ischemic attack, unspecified: Secondary | ICD-10-CM

## 2015-06-14 DIAGNOSIS — D6481 Anemia due to antineoplastic chemotherapy: Secondary | ICD-10-CM | POA: Diagnosis not present

## 2015-06-14 DIAGNOSIS — D649 Anemia, unspecified: Secondary | ICD-10-CM

## 2015-06-14 LAB — CBC
HCT: 30.3 % — ABNORMAL LOW (ref 39.0–52.0)
Hemoglobin: 9.9 g/dL — ABNORMAL LOW (ref 13.0–17.0)
MCH: 25.8 pg — ABNORMAL LOW (ref 26.0–34.0)
MCHC: 32.7 g/dL (ref 30.0–36.0)
MCV: 79.1 fL (ref 78.0–100.0)
PLATELETS: 122 10*3/uL — AB (ref 150–400)
RBC: 3.83 MIL/uL — ABNORMAL LOW (ref 4.22–5.81)
RDW: 14.2 % (ref 11.5–15.5)
WBC: 12.2 10*3/uL — ABNORMAL HIGH (ref 4.0–10.5)

## 2015-06-14 LAB — TYPE AND SCREEN
ABO/RH(D): A POS
Antibody Screen: NEGATIVE
UNIT DIVISION: 0
Unit division: 0

## 2015-06-14 LAB — HEMOGLOBIN A1C
Hgb A1c MFr Bld: 14.6 % — ABNORMAL HIGH (ref 4.8–5.6)
Mean Plasma Glucose: 372 mg/dL

## 2015-06-14 LAB — BASIC METABOLIC PANEL
Anion gap: 14 (ref 5–15)
BUN: 8 mg/dL (ref 6–20)
CO2: 24 mmol/L (ref 22–32)
CREATININE: 0.53 mg/dL — AB (ref 0.61–1.24)
Calcium: 9.4 mg/dL (ref 8.9–10.3)
Chloride: 97 mmol/L — ABNORMAL LOW (ref 101–111)
GFR calc non Af Amer: >60 mL/min (ref >60)
Glucose, Bld: 327 mg/dL — ABNORMAL HIGH (ref 65–99)
Potassium: 3.7 mmol/L (ref 3.5–5.1)
SODIUM: 135 mmol/L (ref 135–145)

## 2015-06-14 LAB — PATHOLOGIST SMEAR REVIEW

## 2015-06-14 LAB — GLUCOSE, CAPILLARY
GLUCOSE-CAPILLARY: 305 mg/dL — AB (ref 65–99)
Glucose-Capillary: 325 mg/dL — ABNORMAL HIGH (ref 65–99)
Glucose-Capillary: 357 mg/dL — ABNORMAL HIGH (ref 65–99)
Glucose-Capillary: 251 mg/dL — ABNORMAL HIGH (ref 65–99)

## 2015-06-14 MED ORDER — METFORMIN HCL 1000 MG PO TABS: 1000.0000 mg | ORAL_TABLET | Freq: Two times a day (BID) | ORAL | Status: AC

## 2015-06-14 MED ORDER — INSULIN ASPART 100 UNIT/ML ~~LOC~~ SOLN
15.0000 [IU] | Freq: Once | SUBCUTANEOUS | Status: AC
  Administered 2015-06-14: 15 [IU] via SUBCUTANEOUS

## 2015-06-14 MED ORDER — CLOPIDOGREL BISULFATE 75 MG PO TABS: 75.0000 mg | ORAL_TABLET | Freq: Every day | ORAL | Status: AC

## 2015-06-14 MED ORDER — LOSARTAN POTASSIUM 50 MG PO TABS: 50.0000 mg | ORAL_TABLET | Freq: Every day | ORAL | Status: AC

## 2015-06-14 MED ORDER — ASPIRIN 81 MG PO CHEW: 81.0000 mg | CHEWABLE_TABLET | Freq: Every day | ORAL | Status: AC

## 2015-06-14 MED ORDER — GLIPIZIDE 10 MG PO TABS: 10.0000 mg | ORAL_TABLET | Freq: Every day | ORAL | Status: AC

## 2015-06-14 MED ORDER — HYDROCODONE-ACETAMINOPHEN 5-325 MG PO TABS: 1.0000 | ORAL_TABLET | Freq: Four times a day (QID) | ORAL | Status: DC | PRN

## 2015-06-14 MED ORDER — ATORVASTATIN CALCIUM 40 MG PO TABS: 40.0000 mg | ORAL_TABLET | Freq: Every day | ORAL | Status: AC

## 2015-06-14 MED ORDER — PROCHLORPERAZINE MALEATE 10 MG PO TABS: 10.0000 mg | ORAL_TABLET | Freq: Four times a day (QID) | ORAL | Status: DC | PRN

## 2015-06-14 MED ORDER — HEPARIN SOD (PORK) LOCK FLUSH 100 UNIT/ML IV SOLN
500.0000 [IU] | INTRAVENOUS | Status: AC | PRN
  Administered 2015-06-14: 500 [IU]

## 2015-06-14 NOTE — Progress Notes (Signed)
Pt's blood sugar 305 at 0613 this am, noted that am insulin not signed off by off going nurse and asked pt is he received his insulin for his blood sugar this am, pt replied that he did.  Rechecked BS at 1030 was 325, 11 units of novolog SQ given to pt.  Recheck BS again 357.  Dr. Hartford Poli informed and instructed to go ahead and given pt additional 15 units of novolog.  Will continue to monitor.  Karie Kirks, Therapist, sports.

## 2015-06-14 NOTE — Discharge Summary (Addendum)
Physician Discharge Summary  Ricky Terry UVO:[id number] DOB: 1946/05/05 DOA: 06/12/2015  PCP: [name], MD  Admit date: 06/12/2015 Discharge date: 06/14/2015  Time spent: 40 minutes  Recommendations for Outpatient Follow-up:  1. Follow-up with oncology as outpatient. 2. Needs to follow up with PCP for DM management.  Discharge Diagnoses:  Principal Problem:   Symptomatic anemia Active Problems:   TIA (transient ischemic attack)   DM (diabetes mellitus) (HCC)   HTN (hypertension)   Prostate cancer (HCC)   Anemia   Pain in the chest   Protein-calorie malnutrition, severe   Discharge Condition: Stable  Diet recommendation: Terry healthy  Filed Weights   06/13/15 0125 06/14/15 0500  Weight: 55.747 kg (122 lb 14.4 oz) 56.609 kg (124 lb 12.8 oz)    History of present illness:  Ricky Terry is a 69 y.o. male with Past medical history of prostate cancer recently started on chemotherapy, CVA, diabetes mellitus, hypertension, shoulder metastasis. Patient presents with complaints of chest pain. Patient also had some dizziness and lightheadedness. Next and is also started today. Next and patient denies having any similar symptoms in the past. If I 40 over last couple of days. The patient has recently started on chemotherapy denies any nausea vomiting or active bleeding or black color bowel movement. He denies any burning urination. After the chemotherapy denies any recent changes in his medication.  The patient is coming from home.  At his baseline ambulates without support And is independent for most of his ADL; manages his medication on his own.   Hospital Course:   Symptomatic anemia Patient presents with complaints of chest pain as well as dizziness. EKG shows sinus tachycardia. With this the patient was discussed with hematology oncology who recommended the blood transfusion. The patient was given 2 units of PRBC. Hemoglobin improved from 7.5 on weight  10.4 posttransfusion, this morning hemoglobin is 9.9. Patient symptoms resolved, CT angiography was done and showed no evidence of PE. His anemia is likely related to his recent chemotherapy treatment.  TIA (transient ischemic attack) History of TIA. In the setting of significant anemia currently holding aspirin and Plavix should resume once active bleeding is ruled out.  DM (diabetes mellitus) ([place]), uncontrolled Patient is currently hyperglycemic.Next and with him on sliding scale insulin currently. Oral hypoglycemic agents held, he has high CBGs Consider adding basal insulin in CBGs still elevated  HTN (hypertension) Blood pressure currently stable. We'll continue monitoring.  Prostate cancer [facility]) Patient has non-treatable prostate cancer. Has bony metastasis seen on CT and recent bone scan. Patient is currently on palliative chemotherapy and radiation. Continue close monitoring. Pain management as needed.   Procedures:  Transfusion of 2 units of packed RBCs  Consultations:  None  Discharge Exam: Filed Vitals:   06/14/15 1244  BP: 163/75  Pulse: 107  Temp: 98 F (36.7 C)  Resp: 20   General: Alert and awake, oriented x3, not in any acute distress. HEENT: anicteric sclera, pupils reactive to light and accommodation, EOMI CVS: S1-S2 clear, no murmur rubs or gallops Chest: clear to auscultation bilaterally, no wheezing, rales or rhonchi Abdomen: soft nontender, nondistended, normal bowel sounds, no organomegaly Extremities: no cyanosis, clubbing or edema noted bilaterally Neuro: Cranial nerves II-XII intact, no focal neurological deficits   Discharge Instructions   Discharge Instructions    Diet - low sodium Terry healthy    Complete by:  As directed      Increase activity slowly    Complete by:  As directed  Current Discharge Medication List    CONTINUE these medications which have CHANGED   Details  aspirin 81 MG chewable tablet Chew 1  tablet (81 mg total) by mouth daily. Qty: 30 tablet, Refills: 0    atorvastatin (LIPITOR) 40 MG tablet Take 1 tablet (40 mg total) by mouth at bedtime. Qty: 30 tablet, Refills: 0    clopidogrel (PLAVIX) 75 MG tablet Take 1 tablet (75 mg total) by mouth daily with breakfast. Qty: 30 tablet, Refills: 1    glipiZIDE (GLUCOTROL) 10 MG tablet Take 1 tablet (10 mg total) by mouth daily before breakfast. Qty: 30 tablet, Refills: 0    HYDROcodone-acetaminophen (NORCO/VICODIN) 5-325 MG tablet Take 1-2 tablets by mouth every 6 (six) hours as needed. Qty: 15 tablet, Refills: 0    losartan (COZAAR) 50 MG tablet Take 1 tablet (50 mg total) by mouth daily. Qty: 30 tablet, Refills: 0    metFORMIN (GLUCOPHAGE) 1000 MG tablet Take 1 tablet (1,000 mg total) by mouth 2 (two) times daily. Qty: 30 tablet, Refills: 0    prochlorperazine (COMPAZINE) 10 MG tablet Take 1 tablet (10 mg total) by mouth every 6 (six) hours as needed for nausea or vomiting. Qty: 30 tablet, Refills: 0      CONTINUE these medications which have NOT CHANGED   Details  acetaminophen (TYLENOL) 500 MG tablet Take 1,000 mg by mouth every 6 (six) hours as needed (for pain.).    cholecalciferol (VITAMIN D) 400 UNITS TABS tablet Take 400 Units by mouth daily.    lidocaine-prilocaine (EMLA) cream Apply 1 application topically as needed. Qty: 30 g, Refills: 0    diazepam (VALIUM) 5 MG tablet Take 1 tablet (5 mg total) by mouth every 12 (twelve) hours as needed for anxiety or muscle spasms. Qty: 10 tablet, Refills: 0      STOP taking these medications     oxyCODONE (ROXICODONE) 5 MG immediate release tablet        No Known Allergies    The results of significant diagnostics from this hospitalization (including imaging, microbiology, ancillary and laboratory) are listed below for reference.    Significant Diagnostic Studies: Dg Chest 2 View  06/12/2015  CLINICAL DATA:  69 year old male with syncope and chest pain for the  past day. Past history of prostate cancer. EXAM: CHEST  2 VIEW COMPARISON:  Prior chest x-ray 02/09/2015 FINDINGS: Cardiac and mediastinal contours remain within normal limits. Atherosclerotic calcification again noted in the transverse aorta. Single-lumen power injectable port catheter via a right IJ approach. Catheter tip projects over the distal SVC. No pneumothorax, pulmonary edema or other airspace opacity. The lungs are clear. Irregular sclerosis in the left humeral head consistent with known bony metastatic disease. IMPRESSION: 1. No active cardiopulmonary disease. 2. The tip of the right IJ approach port a catheter projects over the distal SVC. 3. Mottled sclerosis in the left humeral head consistent with known osseous metastatic disease. Electronically Signed   By: Jacqulynn Cadet M.D.   On: 06/12/2015 16:55   Ct Angio Chest Pe W/cm &/or Wo Cm  06/13/2015  CLINICAL DATA:  History of CVA, diabetes, hypertension. Presenting with chest pain, dizziness and lightheadedness. History of prostate cancer. EXAM: CT ANGIOGRAPHY CHEST WITH CONTRAST TECHNIQUE: Multidetector CT imaging of the chest was performed using the standard protocol during bolus administration of intravenous contrast. Multiplanar CT image reconstructions and MIPs were obtained to evaluate the vascular anatomy. CONTRAST:  40mL OMNIPAQUE IOHEXOL 350 MG/ML SOLN COMPARISON:  Nuclear medicine bone scan of 05/01/2015  describing diffuse bony metastases related to patient's known prostate cancer. FINDINGS: Many of the peripheral segmental and subsegmental pulmonary artery branches are difficult to definitively characterize due to patient breathing motion artifact but there is no convincing pulmonary embolism seen within the main, lobar, or central segmental pulmonary arteries. Scattered atherosclerotic changes noted along the walls of the normal-caliber thoracic aorta. No aortic aneurysm or dissection. Terry size is normal. No pericardial effusion.  Scattered small lymph nodes within the mediastinum. No mass or enlarged lymph nodes within the mediastinum or perihilar regions. There is mild bibasilar atelectasis. Minimal emphysematous change at the lung apices. Lungs otherwise clear. No focal pulmonary nodule or mass. Trachea and central bronchi are unremarkable. Sclerotic metastases are seen throughout the thoracic spine, most severely at the T7 through T9 levels, with associated mild wedge compression deformity of the T8 vertebral body with slight retropulsion of the posterior portion of this vertebral body towards the central canal causing at least moderate central canal stenosis. Additional metastases are noted within the sternum and left humeral head, as also described on previous plain film and nuclear medicine reports. Limited images of the upper abdomen are unremarkable. Left renal stone versus early contrast excretion incompletely imaged. Review of the MIP images confirms the above findings. IMPRESSION: 1. No pulmonary embolism seen, with study limitations detailed above. No central obstructing pulmonary embolism. 2. No aortic aneurysm or dissection. 3. Terry size is normal.  No pericardial effusion. 4. Mild bibasilar atelectasis. Lungs otherwise clear. No evidence of pneumonia. No pulmonary metastases. 5. Diffuse osseous metastases throughout the thoracic spine, consistent with known metastases as described on nuclear medicine bone scan of 05/01/2015. Most severe thoracic spine involvement is at the T7 through T9 vertebral body levels, with associated mild compression deformity of the T8 vertebral body and slight retropulsion causing at least moderate central canal stenosis at this level (any neurologic/cord symptoms? ). These results will be called to the ordering clinician or representative by the Radiologist Assistant, and communication documented in the PACS or zVision Dashboard. Electronically Signed   By: Franki Cabot M.D.   On: 06/13/2015 15:44    Ir Fluoro Guide Cv Line Right  05/29/2015  CLINICAL DATA:  Prostate carcinoma, needs venous access for chemotherapy EXAM: TUNNELED PORT CATHETER PLACEMENT WITH ULTRASOUND AND FLUOROSCOPIC GUIDANCE FLUOROSCOPY TIME:  0.3 minutes, 42  uGym2 DAP ANESTHESIA/SEDATION: Intravenous Fentanyl and Versed were administered as conscious sedation during continuous cardiorespiratory monitoring by the radiology RN, with a total moderate sedation time of 28 minutes. TECHNIQUE: The procedure, risks, benefits, and alternatives were explained to the patient. Questions regarding the procedure were encouraged and answered. The patient understands and consents to the procedure. As antibiotic prophylaxis, cefazolin 2 g was ordered pre-procedure and administered intravenously within one hour of incision. Patency of the right IJ vein was confirmed with ultrasound with image documentation. An appropriate skin site was determined. Skin site was marked. Region was prepped using maximum barrier technique including cap and mask, sterile gown, sterile gloves, large sterile sheet, and Chlorhexidine as cutaneous antisepsis. The region was infiltrated locally with 1% lidocaine. Under real-time ultrasound guidance, the right IJ vein was accessed with a 21 gauge micropuncture needle; the needle tip within the vein was confirmed with ultrasound image documentation. Needle was exchanged over a 018 guidewire for transitional dilator which allowed passage of the [facility] wire into the IVC. Over this, the transitional dilator was exchanged for a 5 [place] MPA catheter. A small incision was made on the right anterior chest  wall and a subcutaneous pocket fashioned. The power-injectable port was positioned and its catheter tunneled to the right IJ dermatotomy site. The MPA catheter was exchanged over an Amplatz wire for a peel-away sheath, through which the port catheter, which had been trimmed to the appropriate length, was advanced and positioned under  fluoroscopy with its tip at the cavoatrial junction. Spot chest radiograph confirms good catheter position and no pneumothorax. The pocket was closed with deep interrupted and subcuticular continuous 3-0 Monocryl sutures. The port was flushed per protocol. The incisions were covered with Dermabond then covered with a sterile dressing. COMPLICATIONS: COMPLICATIONS None immediate IMPRESSION: Technically successful right IJ power-injectable port catheter placement. Ready for routine use. Electronically Signed   By: Lucrezia Europe M.D.   On: 05/29/2015 15:49   Ir US Guide Vasc Access Right  05/29/2015  CLINICAL DATA:  Prostate carcinoma, needs venous access for chemotherapy EXAM: TUNNELED PORT CATHETER PLACEMENT WITH ULTRASOUND AND FLUOROSCOPIC GUIDANCE FLUOROSCOPY TIME:  0.3 minutes, 42  uGym2 DAP ANESTHESIA/SEDATION: Intravenous Fentanyl and Versed were administered as conscious sedation during continuous cardiorespiratory monitoring by the radiology RN, with a total moderate sedation time of 28 minutes. TECHNIQUE: The procedure, risks, benefits, and alternatives were explained to the patient. Questions regarding the procedure were encouraged and answered. The patient understands and consents to the procedure. As antibiotic prophylaxis, cefazolin 2 g was ordered pre-procedure and administered intravenously within one hour of incision. Patency of the right IJ vein was confirmed with ultrasound with image documentation. An appropriate skin site was determined. Skin site was marked. Region was prepped using maximum barrier technique including cap and mask, sterile gown, sterile gloves, large sterile sheet, and Chlorhexidine as cutaneous antisepsis. The region was infiltrated locally with 1% lidocaine. Under real-time ultrasound guidance, the right IJ vein was accessed with a 21 gauge micropuncture needle; the needle tip within the vein was confirmed with ultrasound image documentation. Needle was exchanged over a 018  guidewire for transitional dilator which allowed passage of the [facility] wire into the IVC. Over this, the transitional dilator was exchanged for a 5 [place] MPA catheter. A small incision was made on the right anterior chest wall and a subcutaneous pocket fashioned. The power-injectable port was positioned and its catheter tunneled to the right IJ dermatotomy site. The MPA catheter was exchanged over an Amplatz wire for a peel-away sheath, through which the port catheter, which had been trimmed to the appropriate length, was advanced and positioned under fluoroscopy with its tip at the cavoatrial junction. Spot chest radiograph confirms good catheter position and no pneumothorax. The pocket was closed with deep interrupted and subcuticular continuous 3-0 Monocryl sutures. The port was flushed per protocol. The incisions were covered with Dermabond then covered with a sterile dressing. COMPLICATIONS: COMPLICATIONS None immediate IMPRESSION: Technically successful right IJ power-injectable port catheter placement. Ready for routine use. Electronically Signed   By: Lucrezia Europe M.D.   On: 05/29/2015 15:49    Microbiology: No results found for this or any previous visit (from the past 240 hour(s)).   Labs: Basic Metabolic Panel:  Recent Labs Lab 06/12/15 2108 06/13/15 0320 06/14/15 0355  NA 132* 134* 135  K 3.8 3.2* 3.7  CL 96* 100* 97*  CO2 27 26 24   GLUCOSE 346* 362* 327*  BUN 8 9 8   CREATININE 0.61 0.71 0.53*  CALCIUM 9.1 8.8* 9.4   Liver Function Tests:  Recent Labs Lab 06/13/15 0320  AST 22  ALT 13*  ALKPHOS 613*  BILITOT 0.5  PROT 5.1*  ALBUMIN 2.7*   No results for input(s): LIPASE, AMYLASE in the last 168 hours. No results for input(s): AMMONIA in the last 168 hours. CBC:  Recent Labs Lab 06/12/15 2108 06/13/15 0320 06/13/15 1300 06/14/15 0355  WBC 5.1 6.1  --  12.2*  NEUTROABS 2.8 3.7  --   --   HGB 7.9* 7.5* 10.4* 9.9*  HCT 24.2* 23.2* 31.4* 30.3*  MCV 76.6* 77.1*   --  79.1  PLT 151 142*  --  122*   Cardiac Enzymes:  Recent Labs Lab 06/13/15 0321 06/13/15 0800 06/13/15 1300  TROPONINI 0.06* 0.03 0.04*   BNP: BNP (last 3 results) No results for input(s): BNP in the last 8760 hours.  ProBNP (last 3 results) No results for input(s): PROBNP in the last 8760 hours.  CBG:  Recent Labs Lab 06/13/15 1701 06/13/15 2127 06/14/15 0613 06/14/15 1030 06/14/15 1154  GLUCAP 149* 316* 305* 325* 357*       Signed:  Eyleen Rawlinson A  [facility] 06/14/2015, 2:37 PM

## 2015-06-14 NOTE — Progress Notes (Signed)
Inpatient Diabetes Program Recommendations  AACE/ADA: New Consensus Statement on Inpatient Glycemic Control (2015)  Target Ranges:  Prepandial:   less than 140 mg/dL      Peak postprandial:   less than 180 mg/dL (1-2 hours)      Critically ill patients:  140 - 180 mg/dL   Review of Glycemic Control Results for Ricky Terry, Ricky Terry (MRN [id number]) as of 06/14/2015 13:50  Ref. Range 06/13/2015 11:03 06/13/2015 17:01 06/13/2015 21:27 06/14/2015 06:13 06/14/2015 10:30 06/14/2015 11:54  Glucose-Capillary Latest Ref Range: 65-99 mg/dL 252 (H) 149 (H) 316 (H) 305 (H) 325 (H) 357 (H)    Inpatient Diabetes Program Recommendations:  Insulin - Basal: Please add basal lantus or levemir 10 units at HS or daily Insulin - Meal Coverage: Please add low dose novolog meal coverage of 3 units tidwc.   Thank you Rosita Kea, RN, MSN, CDE  Diabetes Inpatient Program Office: [phone] Pager: [phone] 8:00 am to 5:00 pm

## 2015-06-19 ENCOUNTER — Encounter: Payer: Self-pay | Admitting: Oncology

## 2015-06-19 ENCOUNTER — Telehealth: Payer: Self-pay

## 2015-06-19 NOTE — Telephone Encounter (Signed)
No answer

## 2015-06-19 NOTE — Telephone Encounter (Signed)
0917 pt left message that seemed to mean he is looking for a form to give to unemployment with a return to work timeframe of December. Attempted to call pt back to clarify.

## 2015-06-19 NOTE — Telephone Encounter (Signed)
Pt needs a document stating when MD took him out of work and when he can return to work. This is for unemployment. He will also need one for meals on wheels. Will he even be able to go back to work after chemo? The pt feels like his financial situation is closing in on him. This will be forwarded to MD and also financial counselor.

## 2015-06-19 NOTE — Progress Notes (Signed)
Contacted pt in reference to message received about financial assistance for bills. Advised pt that I contacted him on 05/26/15 to introduce myself and left a voicemail with a return number to call me back with any questions or concerns. Pt states he does not answer calls from numbers he does not know. I asked him what type of bills he was speaking of and he states hospital bills. I reviewed his billing and he shows a zero balance on this side. I let the patient know. I also explained that the bills he is receiving should have a phone number on them regarding payment arrangements and he may bring them in on his next visit for me to take a look at and help him decipher where to call. I also explained that he can also ask them about [employer] where he may be able to combine some accounts into one and make one payment monthly. I asked pt if he had any other financial questions or concerns such as personal bills and he said no just hospital bills. Pt has my name and number to contact me with any additional questions or concerns.

## 2015-06-22 ENCOUNTER — Encounter: Payer: Self-pay | Admitting: Oncology

## 2015-06-22 ENCOUNTER — Encounter: Payer: Self-pay | Admitting: *Deleted

## 2015-06-22 NOTE — Progress Notes (Signed)
Contacted patient again regarding financial questions or concerns. I asked patient did he remember the conversation we had regarding financial assistance and he told me the only concern he had was with his hospital bills. Yes he states he remembers. I asked him what I could help him with today and he said his electric and water bill which were not mentioned before. I asked patient if he could meet with me on 11/9 and bring proof of income and bills needing to be paid and if that was enough time and he states yes. Made appointment with patient and confirmed on 06/28/15 at 9:30. Patient knows to bring with him those documents.

## 2015-06-22 NOTE — Telephone Encounter (Signed)
This RN called and spoke with patient that letter is ready for pickup at the front desk with Ms. Wilma. Patient verbalized understanding.

## 2015-06-22 NOTE — Progress Notes (Signed)
[place] Work  Clinical Social Work was referred by [employer] administrator for financial work related concerns.  CSW contacted patient by phone.  Patient shared he needs letter from MD stating when he can return back to work (at [employer] on [address]) to bring to unemployment office.  The patient shared he is currently on [employer], but it is not enough money to pay all his bills.  CSW shared she is unaware if patient can receive social security retirement and unemployment at the same time, but encouraged patient to discuss with unemployment office.  CSW will forward request to Dr. Philippa Sicks to complete letter.  The patient shared he is having difficulty paying for utilities, rent, and medications on limited income.  Patient may be eligible for [place]- patient requested financial advocate contact him regarding applying for grant.  CSW notified financial advocate.   Polo Riley, MSW, LCSW, OSW-C Clinical Social Worker [facility] [phone]

## 2015-06-28 ENCOUNTER — Encounter: Payer: Self-pay | Admitting: Oncology

## 2015-06-28 ENCOUNTER — Ambulatory Visit: Payer: PPO

## 2015-06-28 ENCOUNTER — Other Ambulatory Visit (HOSPITAL_BASED_OUTPATIENT_CLINIC_OR_DEPARTMENT_OTHER): Payer: PPO

## 2015-06-28 ENCOUNTER — Telehealth: Payer: Self-pay | Admitting: Oncology

## 2015-06-28 ENCOUNTER — Ambulatory Visit (HOSPITAL_BASED_OUTPATIENT_CLINIC_OR_DEPARTMENT_OTHER): Payer: PPO | Admitting: Oncology

## 2015-06-28 ENCOUNTER — Ambulatory Visit (HOSPITAL_BASED_OUTPATIENT_CLINIC_OR_DEPARTMENT_OTHER): Payer: PPO

## 2015-06-28 ENCOUNTER — Telehealth: Payer: Self-pay | Admitting: *Deleted

## 2015-06-28 VITALS — BP 172/68 | HR 101 | Temp 98.0°F | Resp 18 | Ht 66.0 in | Wt 125.3 lb

## 2015-06-28 DIAGNOSIS — D6481 Anemia due to antineoplastic chemotherapy: Secondary | ICD-10-CM

## 2015-06-28 DIAGNOSIS — C7951 Secondary malignant neoplasm of bone: Secondary | ICD-10-CM

## 2015-06-28 DIAGNOSIS — Z95828 Presence of other vascular implants and grafts: Secondary | ICD-10-CM

## 2015-06-28 DIAGNOSIS — C61 Malignant neoplasm of prostate: Secondary | ICD-10-CM

## 2015-06-28 DIAGNOSIS — Z5111 Encounter for antineoplastic chemotherapy: Secondary | ICD-10-CM

## 2015-06-28 LAB — COMPREHENSIVE METABOLIC PANEL (CC13)
ALBUMIN: 3.1 g/dL — AB (ref 3.5–5.0)
AST: 12 U/L (ref 5–34)
Alkaline Phosphatase: 620 U/L — ABNORMAL HIGH (ref 40–150)
Anion Gap: 9 mEq/L (ref 3–11)
BUN: 10.6 mg/dL (ref 7.0–26.0)
CALCIUM: 9.9 mg/dL (ref 8.4–10.4)
CO2: 27 mEq/L (ref 22–29)
CREATININE: 0.8 mg/dL (ref 0.7–1.3)
Chloride: 103 mEq/L (ref 98–109)
EGFR: >90 mL/min/{1.73_m2} (ref >90)
Glucose: 391 mg/dl — ABNORMAL HIGH (ref 70–140)
Potassium: 4 mEq/L (ref 3.5–5.1)
SODIUM: 139 meq/L (ref 136–145)
Total Bilirubin: 0.38 mg/dL (ref 0.20–1.20)
Total Protein: 6.4 g/dL (ref 6.4–8.3)

## 2015-06-28 LAB — CBC WITH DIFFERENTIAL/PLATELET
BASO%: 1.2 % (ref 0.0–2.0)
Basophils Absolute: 0.1 10*3/uL (ref 0.0–0.1)
EOS%: 0.1 % (ref 0.0–7.0)
Eosinophils Absolute: 0 10*3/uL (ref 0.0–0.5)
HCT: 27.6 % — ABNORMAL LOW (ref 38.4–49.9)
HGB: 9.1 g/dL — ABNORMAL LOW (ref 13.0–17.1)
LYMPH%: 13.3 % — ABNORMAL LOW (ref 14.0–49.0)
MCH: 26 pg — ABNORMAL LOW (ref 27.2–33.4)
MCHC: 33.1 g/dL (ref 32.0–36.0)
MCV: 78.5 fL — ABNORMAL LOW (ref 79.3–98.0)
MONO#: 0.6 10*3/uL (ref 0.1–0.9)
MONO%: 5.7 % (ref 0.0–14.0)
NEUT#: 8 10*3/uL — ABNORMAL HIGH (ref 1.5–6.5)
NEUT%: 79.7 % — ABNORMAL HIGH (ref 39.0–75.0)
Platelets: 321 10*3/uL (ref 140–400)
RBC: 3.51 10*6/uL — ABNORMAL LOW (ref 4.20–5.82)
RDW: 15.9 % — ABNORMAL HIGH (ref 11.0–14.6)
WBC: 10 10*3/uL (ref 4.0–10.3)
lymph#: 1.3 10*3/uL (ref 0.9–3.3)

## 2015-06-28 MED ORDER — SODIUM CHLORIDE 0.9 % IV SOLN
75.0000 mg/m2 | Freq: Once | INTRAVENOUS | Status: AC
  Administered 2015-06-28: 120 mg via INTRAVENOUS
  Filled 2015-06-28: qty 12

## 2015-06-28 MED ORDER — PROCHLORPERAZINE MALEATE 10 MG PO TABS: 10.0000 mg | ORAL_TABLET | Freq: Four times a day (QID) | ORAL | Status: AC | PRN

## 2015-06-28 MED ORDER — HYDROCODONE-ACETAMINOPHEN 5-325 MG PO TABS: 1.0000 | ORAL_TABLET | Freq: Four times a day (QID) | ORAL | Status: DC | PRN

## 2015-06-28 MED ORDER — SODIUM CHLORIDE 0.9 % IV SOLN
Freq: Once | INTRAVENOUS | Status: AC
  Administered 2015-06-28: 10:00:00 via INTRAVENOUS

## 2015-06-28 MED ORDER — HEPARIN SOD (PORK) LOCK FLUSH 100 UNIT/ML IV SOLN
500.0000 [IU] | Freq: Once | INTRAVENOUS | Status: AC | PRN
  Administered 2015-06-28: 500 [IU]
  Filled 2015-06-28: qty 5

## 2015-06-28 MED ORDER — SODIUM CHLORIDE 0.9 % IJ SOLN
10.0000 mL | INTRAMUSCULAR | Status: DC | PRN
  Administered 2015-06-28: 10 mL
  Filled 2015-06-28: qty 10

## 2015-06-28 MED ORDER — DEXAMETHASONE SODIUM PHOSPHATE 100 MG/10ML IJ SOLN
Freq: Once | INTRAMUSCULAR | Status: AC
  Administered 2015-06-28: 10:00:00 via INTRAVENOUS
  Filled 2015-06-28: qty 4

## 2015-06-28 MED ORDER — SODIUM CHLORIDE 0.9 % IJ SOLN
10.0000 mL | INTRAMUSCULAR | Status: DC | PRN
  Administered 2015-06-28: 10 mL via INTRAVENOUS
  Filled 2015-06-28: qty 10

## 2015-06-28 NOTE — Telephone Encounter (Signed)
Per staff message and POF I have scheduled appts. Advised scheduler of appts plus first available given on 11/30. JMW

## 2015-06-28 NOTE — Progress Notes (Signed)
Pt came in today to bring proof of income for [place]. Pt approved for [place] today for $400. Copy of approval letter and list of expenses it covers given to pt. Had pt complete ACS application. Faxed application, confirm received. Called pt's insurance company per pt's request to see if OOP had been met. It had not so had patient fill out [employer] application for assistance with [place]. Faxed application to [employer] and confirm received.  Pt brought [employer] and [place]) to be paid from grant. Called [employer] and made a commitment payment. [place] and spoke with [place] who states he had a 0 balance. Advised pt he can go up there to receive proof of payment history and confirm this balance because he had already left my office and they would not release any additional information to me. Pt states he would go by there and then give me a call. Gave pt my card for any additional financial questions or concerns.

## 2015-06-28 NOTE — Progress Notes (Signed)
Hematology and Oncology Follow Up Visit  Ricky Terry [id number] 03-29-1946 69 y.o. 06/28/2015 9:16 AM Ricky Terry, MDOsei-Bonsu, [name], MD   Principle Diagnosis: 69 year old gentleman with castration resistant metastatic prostate cancer with disease to the bone. His initial diagnosis was in April 2015 with a PSA of 368 and a Gleason score of 4+5 = 9. He has metastatic disease to the bone at the time of diagnosis..   Prior Therapy: He was treated with hormone therapy and his PSA initially dropped to 34 in September 2015. His PSA was up to 72 and February 2016 and Casodex was added. His most recent PSA was up to 77.9 on 02/28/2015 and 206 in October 2016. His repeat bone scan showed marked progression of his bony metastasis compared to his initial imaging studies.  Current therapy: Taxotere chemotherapy at 75 mg per metered square every 3 weeks received cycle 1 on 06/07/2015.  Interim History: Ricky Terry presents today for a follow-up visit. Since the last visit, he received the first cycle of chemotherapy and tolerated it fairly well. He did develop symptomatic anemia and was hospitalized between 06/13/2015 and 06/14/2015. He received packed red cell transfusions and recovered fairly well. His hemoglobin was down 7.5 and after transfusion was up to 10.4. He does not report any other complications related to chemotherapy. Did not report any infusion-related complications, nausea, neuropathy or nail changes. His performance status remains adequate and able to drive and attends to activities of daily living. His appetite have also improved and have gained 2 pounds.  His pain has been under reasonable control with the help of hydrocodone. He takes it at nighttime and helps him with his sleep. Does not report any breakthrough pain medication need the middle the night or most of the day.  He does not report any headaches, blurry vision, syncope or seizures. He does not report any fevers,  chills or sweats. He does not report any chest pain, palpitation orthopnea. Does not report any leg edema, cough, hemoptysis or wheezing. Does not report any nausea, vomiting, abdominal pain, hematochezia or melena. He does not report any frequency, urgency or hesitancy. He does report skeletal complaints clinic back pain or hip pain. Does not report any lymphadenopathy or petechiae. Remaining review of systems unremarkable  Medications: I have reviewed the patient's current medications.  No major changes by my review. Current Outpatient Prescriptions  Medication Sig Dispense Refill  . acetaminophen (TYLENOL) 500 MG tablet Take 1,000 mg by mouth every 6 (six) hours as needed (for pain.).    Marland Kitchen aspirin 81 MG chewable tablet Chew 1 tablet (81 mg total) by mouth daily. 30 tablet 0  . atorvastatin (LIPITOR) 40 MG tablet Take 1 tablet (40 mg total) by mouth at bedtime. 30 tablet 0  . cholecalciferol (VITAMIN D) 400 UNITS TABS tablet Take 400 Units by mouth daily.    . clopidogrel (PLAVIX) 75 MG tablet Take 1 tablet (75 mg total) by mouth daily with breakfast. 30 tablet 1  . glipiZIDE (GLUCOTROL) 10 MG tablet Take 1 tablet (10 mg total) by mouth daily before breakfast. 30 tablet 0  . HYDROcodone-acetaminophen (NORCO/VICODIN) 5-325 MG tablet Take 1-2 tablets by mouth every 6 (six) hours as needed. 40 tablet 0  . lidocaine-prilocaine (EMLA) cream Apply 1 application topically as needed. 30 g 0  . losartan (COZAAR) 50 MG tablet Take 1 tablet (50 mg total) by mouth daily. 30 tablet 0  . metFORMIN (GLUCOPHAGE) 1000 MG tablet Take 1 tablet (1,000 mg total) by mouth  2 (two) times daily. 30 tablet 0  . prochlorperazine (COMPAZINE) 10 MG tablet Take 1 tablet (10 mg total) by mouth every 6 (six) hours as needed for nausea or vomiting. 30 tablet 1   No current facility-administered medications for this visit.   Facility-Administered Medications Ordered in Other Visits  Medication Dose Route Frequency Provider Last  Rate Last Dose  . sodium chloride 0.9 % injection 10 mL  10 mL Intravenous PRN Chauncey Cruel, MD   10 mL at 06/28/15 8416     Allergies: No Known Allergies   Physical Exam: Blood pressure 172/68, pulse 101, temperature 98 F (36.7 C), temperature source Oral, resp. rate 18, height 5\' 6"  (1.676 m), weight 125 lb 4.8 oz (56.836 kg), SpO2 100 %. ECOG: 1 General appearance: alert and cooperative well-appearing gentleman without distress. Head: Normocephalic, without obvious abnormality no oral ulcers or lesions. Neck: no adenopathy Lymph nodes: Cervical, supraclavicular, and axillary nodes normal. Heart:regular rate and rhythm, S1, S2 normal, no murmur, click, rub or gallop Lung:chest clear, no wheezing, rales, normal symmetric air entry Abdomin: soft, non-tender, without masses or organomegaly EXT:no erythema, induration, or nodules   Lab Results: Lab Results  Component Value Date   WBC 10.0 06/28/2015   HGB 9.1* 06/28/2015   HCT 27.6* 06/28/2015   MCV 78.5* 06/28/2015   PLT 321 06/28/2015     Chemistry      Component Value Date/Time   NA 135 06/14/2015 0355   NA 136 06/07/2015 0911   K 3.7 06/14/2015 0355   K 4.9 06/07/2015 0911   CL 97* 06/14/2015 0355   CO2 24 06/14/2015 0355   CO2 28 06/07/2015 0911   BUN 8 06/14/2015 0355   BUN 9.8 06/07/2015 0911   CREATININE 0.53* 06/14/2015 0355   CREATININE 0.8 06/07/2015 0911      Component Value Date/Time   CALCIUM 9.4 06/14/2015 0355   CALCIUM 10.5* 06/07/2015 0911   ALKPHOS 613* 06/13/2015 0320   ALKPHOS 1,250* 06/07/2015 0911   AST 22 06/13/2015 0320   AST 26 06/07/2015 0911   ALT 13* 06/13/2015 0320   ALT 9 06/07/2015 0911   BILITOT 0.5 06/13/2015 0320   BILITOT 0.32 06/07/2015 0911      Results for Ricky, Terry (MRN [id number]) as of 06/28/2015 08:26  Ref. Range 06/07/2015 09:11  PSA Latest Ref Range: <=4.00 ng/mL 206.70 (H)    Impression and Plan:  69 year old gentleman with the following  issues:  1. Castration resistant metastatic prostate cancer with disease to the bone. His initial diagnosis was in April 2015 with a PSA of 368 and a Gleason score of 4+5 = 9. He was initially treated with Lupron initially and Casodex added in March 2016 after his PSA started to rise. His initial response in his PSA went down to 34 and now is up to 206. His staging workup including a bone scan obtained on 02/28/2015 showed progression of disease.  He is currently on Taxotere chemotherapy and received his first cycle without any major complications. He did develop symptomatic anemia and resultant with transfusions. The plan is to proceed with cycle 2 without any dose reduction or delay. His quality of life have improved after the start of chemotherapy.  2. IV access: Port-A-Cath inserted without any complications. EMLA cream is available to the patient with instructions to use it.  3. Nausea prophylaxis: Compazine was refilled patient today.  4. Neutropenia prophylaxis: He will receive Neulasta after each injection. Arthralgias, myalgias and bone pain  are potential complications were reviewed with the patient. Hydrocodone and Claritin can be used to alleviate some of the symptoms.  5. Bone health: He is on calcium and vitamin D. Delton See will be started once dental clearance has been obtained.  6. Weight loss: Seems to be improving since the start of chemotherapy. Nutritional supplements was provided to the patient as well today.  7. Pain: His currently on hydrocodone and seems to have helped his symptoms.  8. Prognosis: Treatment goal is palliative and he understands that he has a rather aggressive disease with potentially limited life expectancy. However, chemotherapy should offer her excellent palliation of his cancer-related symptoms.  9. Anemia: His hemoglobin is adequate today after transfusions. We'll continue to monitor this and transfuse as needed. Growth factor support can be used to help  his chemotherapy-induced anemia.  10. Hormone therapy: I have recommended indefinite androgen deprivation and we'll monitor his testosterone periodically and administer Lupron at that time.  11. Follow-up: Will be in 3 weeks for the next cycle of chemotherapy.  [facility], MD 11/9/20169:16 AM

## 2015-06-28 NOTE — Patient Instructions (Signed)
[facility] Discharge Instructions for Patients Receiving Chemotherapy  Today you received the following chemotherapy agents Taxotere.  To help prevent nausea and vomiting after your treatment, we encourage you to take your nausea medication Compazine 10 mg every 6 hours as needed.  If you develop nausea and vomiting that is not controlled by your nausea medication, call the clinic.   BELOW ARE SYMPTOMS THAT SHOULD BE REPORTED IMMEDIATELY:  *FEVER GREATER THAN 100.5 F  *CHILLS WITH OR WITHOUT FEVER  NAUSEA AND VOMITING THAT IS NOT CONTROLLED WITH YOUR NAUSEA MEDICATION  *UNUSUAL SHORTNESS OF BREATH  *UNUSUAL BRUISING OR BLEEDING  TENDERNESS IN MOUTH AND THROAT WITH OR WITHOUT PRESENCE OF ULCERS  *URINARY PROBLEMS  *BOWEL PROBLEMS  UNUSUAL RASH Items with * indicate a potential emergency and should be followed up as soon as possible.  Feel free to call the clinic you have any questions or concerns. The clinic phone number is [phone].  Please show the CHEMO ALERT CARD at check-in to the Emergency Department and triage nurse.   

## 2015-06-28 NOTE — Telephone Encounter (Signed)
per pof to sch pt appt-sent MWrmailt os ch pt trmt-pt to get updated copy b4 leaving

## 2015-06-28 NOTE — Patient Instructions (Signed)

## 2015-06-29 LAB — PSA: PSA: 185.3 ng/mL — ABNORMAL HIGH (ref <=4.00)

## 2015-06-30 ENCOUNTER — Ambulatory Visit (HOSPITAL_BASED_OUTPATIENT_CLINIC_OR_DEPARTMENT_OTHER): Payer: PPO

## 2015-06-30 VITALS — BP 144/58 | HR 115 | Temp 98.3°F

## 2015-06-30 DIAGNOSIS — Z5189 Encounter for other specified aftercare: Secondary | ICD-10-CM | POA: Diagnosis not present

## 2015-06-30 DIAGNOSIS — C61 Malignant neoplasm of prostate: Secondary | ICD-10-CM

## 2015-06-30 MED ORDER — PEGFILGRASTIM INJECTION 6 MG/0.6ML ~~LOC~~
6.0000 mg | PREFILLED_SYRINGE | Freq: Once | SUBCUTANEOUS | Status: AC
  Administered 2015-06-30: 6 mg via SUBCUTANEOUS
  Filled 2015-06-30: qty 0.6

## 2015-07-09 ENCOUNTER — Encounter (HOSPITAL_COMMUNITY): Payer: Self-pay | Admitting: Emergency Medicine

## 2015-07-09 ENCOUNTER — Emergency Department (HOSPITAL_COMMUNITY)
Admission: EM | Admit: 2015-07-09 | Discharge: 2015-07-09 | Disposition: A | Discharge status: Home / Self Care | Payer: PPO | Source: Home / Self Care | Attending: Emergency Medicine | Admitting: Emergency Medicine

## 2015-07-09 ENCOUNTER — Emergency Department (HOSPITAL_COMMUNITY): Payer: PPO

## 2015-07-09 DIAGNOSIS — G893 Neoplasm related pain (acute) (chronic): Secondary | ICD-10-CM

## 2015-07-09 DIAGNOSIS — Z8673 Personal history of transient ischemic attack (TIA), and cerebral infarction without residual deficits: Secondary | ICD-10-CM

## 2015-07-09 DIAGNOSIS — E1165 Type 2 diabetes mellitus with hyperglycemia: Secondary | ICD-10-CM

## 2015-07-09 DIAGNOSIS — Z7982 Long term (current) use of aspirin: Secondary | ICD-10-CM

## 2015-07-09 DIAGNOSIS — R739 Hyperglycemia, unspecified: Secondary | ICD-10-CM

## 2015-07-09 DIAGNOSIS — I10 Essential (primary) hypertension: Secondary | ICD-10-CM | POA: Insufficient documentation

## 2015-07-09 DIAGNOSIS — Z7902 Long term (current) use of antithrombotics/antiplatelets: Secondary | ICD-10-CM | POA: Insufficient documentation

## 2015-07-09 DIAGNOSIS — D6481 Anemia due to antineoplastic chemotherapy: Secondary | ICD-10-CM

## 2015-07-09 DIAGNOSIS — D72829 Elevated white blood cell count, unspecified: Secondary | ICD-10-CM | POA: Insufficient documentation

## 2015-07-09 DIAGNOSIS — Z8546 Personal history of malignant neoplasm of prostate: Secondary | ICD-10-CM | POA: Insufficient documentation

## 2015-07-09 DIAGNOSIS — Z79899 Other long term (current) drug therapy: Secondary | ICD-10-CM

## 2015-07-09 DIAGNOSIS — T451X5A Adverse effect of antineoplastic and immunosuppressive drugs, initial encounter: Secondary | ICD-10-CM

## 2015-07-09 LAB — CBC WITH DIFFERENTIAL/PLATELET
BASOS PCT: 0 %
Basophils Absolute: 0 10*3/uL (ref 0.0–0.1)
EOS PCT: 0 %
Eosinophils Absolute: 0 10*3/uL (ref 0.0–0.7)
HEMATOCRIT: 22.7 % — AB (ref 39.0–52.0)
HEMOGLOBIN: 7.4 g/dL — AB (ref 13.0–17.0)
LYMPHS PCT: 7 %
Lymphs Abs: 2.2 10*3/uL (ref 0.7–4.0)
MCH: 26.4 pg (ref 26.0–34.0)
MCHC: 32.6 g/dL (ref 30.0–36.0)
MCV: 81.1 fL (ref 78.0–100.0)
MONOS PCT: 3 %
Monocytes Absolute: 1 10*3/uL (ref 0.1–1.0)
NEUTROS PCT: 90 %
Neutro Abs: 28.9 10*3/uL — ABNORMAL HIGH (ref 1.7–7.7)
Platelets: 108 10*3/uL — ABNORMAL LOW (ref 150–400)
RBC: 2.8 MIL/uL — AB (ref 4.22–5.81)
RDW: 16 % — ABNORMAL HIGH (ref 11.5–15.5)
WBC: 32.1 10*3/uL — AB (ref 4.0–10.5)

## 2015-07-09 LAB — COMPREHENSIVE METABOLIC PANEL
ALBUMIN: 3.3 g/dL — AB (ref 3.5–5.0)
ALT: 15 U/L — AB (ref 17–63)
AST: 20 U/L (ref 15–41)
Alkaline Phosphatase: 446 U/L — ABNORMAL HIGH (ref 38–126)
Anion gap: 8 (ref 5–15)
BILIRUBIN TOTAL: 0.5 mg/dL (ref 0.3–1.2)
BUN: 15 mg/dL (ref 6–20)
CO2: 26 mmol/L (ref 22–32)
CREATININE: 0.55 mg/dL — AB (ref 0.61–1.24)
Calcium: 9 mg/dL (ref 8.9–10.3)
Chloride: 99 mmol/L — ABNORMAL LOW (ref 101–111)
GFR calc Af Amer: >60 mL/min (ref >60)
GLUCOSE: 422 mg/dL — AB (ref 65–99)
POTASSIUM: 4.2 mmol/L (ref 3.5–5.1)
Sodium: 133 mmol/L — ABNORMAL LOW (ref 135–145)
TOTAL PROTEIN: 6.3 g/dL — AB (ref 6.5–8.1)

## 2015-07-09 LAB — CK: CK TOTAL: 558 U/L — AB (ref 49–397)

## 2015-07-09 LAB — CBG MONITORING, ED: Glucose-Capillary: 276 mg/dL — ABNORMAL HIGH (ref 65–99)

## 2015-07-09 MED ORDER — SODIUM CHLORIDE 0.9 % IV BOLUS (SEPSIS)
1000.0000 mL | Freq: Once | INTRAVENOUS | Status: AC
  Administered 2015-07-09: 1000 mL via INTRAVENOUS

## 2015-07-09 MED ORDER — INSULIN ASPART 100 UNIT/ML ~~LOC~~ SOLN
10.0000 [IU] | Freq: Once | SUBCUTANEOUS | Status: AC
  Administered 2015-07-09: 10 [IU] via SUBCUTANEOUS
  Filled 2015-07-09: qty 1

## 2015-07-09 MED ORDER — LIDOCAINE-PRILOCAINE 2.5-2.5 % EX CREA
TOPICAL_CREAM | Freq: Once | CUTANEOUS | Status: AC
  Administered 2015-07-09: 19:00:00 via TOPICAL
  Filled 2015-07-09: qty 5

## 2015-07-09 MED ORDER — HEPARIN SOD (PORK) LOCK FLUSH 100 UNIT/ML IV SOLN
500.0000 [IU] | Freq: Once | INTRAVENOUS | Status: AC
  Administered 2015-07-09: 500 [IU]
  Filled 2015-07-09: qty 5

## 2015-07-09 MED ORDER — OXYCODONE-ACETAMINOPHEN 5-325 MG PO TABS: 1.0000 | ORAL_TABLET | Freq: Four times a day (QID) | ORAL | Status: DC | PRN

## 2015-07-09 MED ORDER — HYDROCODONE-ACETAMINOPHEN 5-325 MG PO TABS
1.0000 | ORAL_TABLET | Freq: Once | ORAL | Status: AC
  Administered 2015-07-09: 1 via ORAL
  Filled 2015-07-09: qty 1

## 2015-07-09 MED ORDER — MORPHINE SULFATE (PF) 4 MG/ML IV SOLN
4.0000 mg | Freq: Once | INTRAVENOUS | Status: AC
  Administered 2015-07-09: 4 mg via INTRAVENOUS
  Filled 2015-07-09: qty 1

## 2015-07-09 NOTE — ED Notes (Signed)
Patient transported to X-ray 

## 2015-07-09 NOTE — ED Notes (Signed)
RN contacted pharmacy again to see when EMLA cream would be sent.  Pharmacy states that they are sending it to RN right now.

## 2015-07-09 NOTE — ED Notes (Signed)
RN contacted Pharmacy regarding EMLA cream to determine where it will come from.  Pharmacy stated that they would tube it to RN from [place].

## 2015-07-09 NOTE — ED Notes (Signed)
Pt walk up and down hall with no problems.

## 2015-07-09 NOTE — ED Provider Notes (Signed)
CSN: [id number]     Arrival date & time 07/09/15  1716 History   First MD Initiated Contact with Patient 07/09/15 1815     Chief Complaint  Patient presents with  . Leg Pain     (Consider location/radiation/quality/duration/timing/severity/associated sxs/prior Treatment) HPI   69 year old male with history of prostate cancer, stroke, diabetes, hypertension presents with bilateral leg pain. Patient report for the past for 5 months he has had persistent pain to his bilateral upper thigh. Pain is described as a sharp and achy sensation, worsening with walking especially walking up the steps and improves with rest. Pain is currently rated as an 8 out of 10. Pain is nonradiating. No associated hip or knee pain. Pain is not well controlled despite taking ibuprofen and Tylenol as needed. Patient has trouble sleeping at night due to pain. He denies any specific injury or trauma. He was diagnosed with prostate cancer last year and currently undergoing chemotherapy. He is currently on Plavix. He reports moderate amount of weight loss from 165 pounds to 125 within this past month. He denies night sweats or fever. He denies any back pain, bowel bladder incontinence, saddle anesthesia, numbness or weakness.  Past Medical History  Diagnosis Date  . Stroke ([place])   . Diabetes mellitus   . Hypertension   . Prostate cancer [facility])    Past Surgical History  Procedure Laterality Date  . Eye surgery     Family History  Problem Relation Age of Onset  . Cancer Mother   . Hypertension Mother   . Hypertension Father   . Cancer Father   . Diabetes Sister   . Hypertension Sister   . Hypertension Brother   . Diabetes Brother    Social History  Substance Use Topics  . Smoking status: Never Smoker   . Smokeless tobacco: None  . Alcohol Use: No    Review of Systems  All other systems reviewed and are negative.     Allergies  Review of patient's allergies indicates no known allergies.  Home  Medications   Prior to Admission medications   Medication Sig Start Date End Date Taking? Authorizing Provider  aspirin 81 MG chewable tablet Chew 1 tablet (81 mg total) by mouth daily. 06/14/15  Yes Verlee Monte, MD  atorvastatin (LIPITOR) 40 MG tablet Take 1 tablet (40 mg total) by mouth at bedtime. 06/14/15  Yes Verlee Monte, MD  cholecalciferol (VITAMIN D) 400 UNITS TABS tablet Take 400 Units by mouth daily.   Yes Historical Provider, MD  clopidogrel (PLAVIX) 75 MG tablet Take 1 tablet (75 mg total) by mouth daily with breakfast. 06/14/15  Yes Verlee Monte, MD  glipiZIDE (GLUCOTROL) 10 MG tablet Take 1 tablet (10 mg total) by mouth daily before breakfast. 06/14/15  Yes Verlee Monte, MD  HYDROcodone-acetaminophen (NORCO/VICODIN) 5-325 MG tablet Take 1-2 tablets by mouth every 6 (six) hours as needed. 06/28/15  Yes Wyatt Portela, MD  lidocaine-prilocaine (EMLA) cream Apply 1 application topically as needed. 05/23/15  Yes Wyatt Portela, MD  losartan (COZAAR) 50 MG tablet Take 1 tablet (50 mg total) by mouth daily. 06/14/15  Yes Verlee Monte, MD  metFORMIN (GLUCOPHAGE) 1000 MG tablet Take 1 tablet (1,000 mg total) by mouth 2 (two) times daily. 06/14/15  Yes Verlee Monte, MD  acetaminophen (TYLENOL) 500 MG tablet Take 1,000 mg by mouth every 6 (six) hours as needed (for pain.).    Historical Provider, MD  prochlorperazine (COMPAZINE) 10 MG tablet Take 1 tablet (10 mg total)  by mouth every 6 (six) hours as needed for nausea or vomiting. Patient not taking: Reported on 07/09/2015 06/28/15   Wyatt Portela, MD   BP 127/64 mmHg  Pulse 120  Temp(Src) 97.3 F (36.3 C) (Oral)  Resp 18  Ht 5\' 6"  (1.676 m)  Wt 125 lb (56.7 kg)  BMI 20.19 kg/m2  SpO2 98% Physical Exam  Constitutional: No distress.  African-American male, appears cachectic.  HENT:  Head: Atraumatic.  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  Cardiovascular: Intact distal pulses.   Tachycardia  without murmurs rubs or gallops  Pulmonary/Chest: Effort normal and breath sounds normal.  Abdominal: Soft. There is no tenderness.  Musculoskeletal: He exhibits tenderness (Tenderness to bilateral thigh on palpation without focal point tenderness overlying skin changes. No gross deformity noted. No peripheral edema to lower extremities.). He exhibits no edema.  No significant midline spine tenderness crepitus or step-off  Neurological: He is alert.  Patellar deep tendon reflex intact bilaterally. Poor effort but normal strength to bilateral lower extremities.  Skin: No rash noted.  Psychiatric: He has a normal mood and affect.  Nursing note and vitals reviewed.   ED Course  Procedures (including critical care time)   Patient with history of prostate cancer here with bilateral thigh pain. This is chronic in nature. He is currently on Plavix therefore have low suspicion for DVT given the distribution of his pain. He does have a history of cancer therefore I will obtain x-ray of bilateral thigh to rule out malignancy although my suspicion is low. He is currently on Lipitor which could potentially cause rhabdomyolysis and muscle aches. He is tachycardic without chest pain or shortness of breath and no fever. IV fluid given and pain medication provided.  Care discussed with Dr. Ralene Bathe.    9:32 PM  X-ray of right femur demonstrate sclerotic bone metastasis which is in similar distribution as prior nuclear medicine study. X-ray of left femur is unremarkable. I suspect patient's pain is related to his metastatic bone disease. He reported improvement of symptoms after receiving Vicodin. He does have an elevated total CK of 558 but this is not consistence with rhabdomyolysis. He agrees he will talk to his doctor in regards to the before as this can cause muscle aches as well. He is hyperglycemic with a CBG of 422, normal anion gap. He'll benefit from additional IV fluid he has a leukocytosis with WBC 32.1,  this is likely secondary to his cancer which she is currently receiving chemotherapy. His hemoglobin is 7.4, lower than baseline but not symptomatic.   10:12 PM Pt is currently on Neulasta, and steroid which likely explain his leukocytosis.  Hgb 7.4 but pt felt better after treatment and denies lightheadedness or dizziness.    10:58 PM CBG improves to 276 after IVF and insulin.  Tachycardia improves.  Pt wants to go home.  Pt will receive pain medication prescription and return precaution discussed.    Labs Review Labs Reviewed  CBC WITH DIFFERENTIAL/PLATELET - Abnormal; Notable for the following:    WBC 32.1 (*)    RBC 2.80 (*)    Hemoglobin 7.4 (*)    HCT 22.7 (*)    RDW 16.0 (*)    Platelets 108 (*)    Neutro Abs 28.9 (*)    All other components within normal limits  COMPREHENSIVE METABOLIC PANEL - Abnormal; Notable for the following:    Sodium 133 (*)    Chloride 99 (*)  Glucose, Bld 422 (*)    Creatinine, Ser 0.55 (*)    Total Protein 6.3 (*)    Albumin 3.3 (*)    ALT 15 (*)    Alkaline Phosphatase 446 (*)    All other components within normal limits  CK - Abnormal; Notable for the following:    Total CK 558 (*)    All other components within normal limits  CBG MONITORING, ED - Abnormal; Notable for the following:    Glucose-Capillary 276 (*)    All other components within normal limits    Imaging Review Dg Femur Min 2 Views Left  07/09/2015  CLINICAL DATA:  Prostate cancer.  BILATERAL leg pain. EXAM: LEFT FEMUR 2 VIEWS COMPARISON:  Bone scan 05/01/2015. FINDINGS: No significant sclerotic or lytic lesions are observed in the LEFT femur. There is no fracture. The adjacent LEFT ischium demonstrates sclerosis, similar to the distribution noted on previous nuclear medicine scintigraphy scan. Soft tissue calcification adjacent to the lesser trochanter. IMPRESSION: No visible LEFT femur metastases or acute findings. Electronically Signed   By: Staci Righter M.D.   On:  07/09/2015 19:10   Dg Femur, Min 2 Views Right  07/09/2015  CLINICAL DATA:  BILATERAL leg pain for 4-5 months. History of prostate cancer. EXAM: RIGHT FEMUR 2 VIEWS COMPARISON:  Bone scan 05/01/2015.  LEFT femur reported separately. FINDINGS: Sclerotic metastases are noted throughout the proximal RIGHT femur, including the greater and lesser trochanter, femoral neck, and femoral head. The adjacent pelvis is also affected in the acetabular region, iliac waiting, and superior pubic ramus. No pathologic fracture is evident. Vascular calcification. IMPRESSION: Sclerotic bone metastases are observed, similar in distribution to the prior nuclear medicine scintigraphy scan. No pathologic fracture. Electronically Signed   By: Staci Righter M.D.   On: 07/09/2015 19:07   I have personally reviewed and evaluated these images and lab results as part of my medical decision-making.   EKG Interpretation None      MDM   Final diagnoses:  Cancer related pain  Anemia due to antineoplastic chemotherapy  Leukocytosis  Hyperglycemia    BP 174/79 mmHg  Pulse 108  Temp(Src) 97.6 F (36.4 C) (Oral)  Resp 24  Ht 5\' 6"  ([id number] m)  Wt 125 lb (56.7 kg)  BMI 20.19 kg/m2  SpO2 97%     Domenic Moras, PA-C 07/09/15 2307  Quintella Reichert, MD 07/10/15 [phone]

## 2015-07-09 NOTE — ED Notes (Addendum)
Patient states he has had bilateral upper leg pain x4-5 months. Patient states he has been taking ibuprofen to ease the pain, but it is not working anymore. Patient states he does not sleep well at night due to pain. States walking has become more difficult because of the pain, especially walking up stairs. Denies injury/trauma

## 2015-07-09 NOTE — Discharge Instructions (Signed)
Your pain is likely related to your metastatic cancer.  Take percocet as needed for pain.  Follow up closely with your cancer doctor for further management.  Your pain may also related to taking Lipitor, discuss this with your doctor to see if you will need to take this medication any longer.  Your blood count is low and your white count is high.  This is likely related to your chemotherapy.  Return to the ER if your condition worsen or if you have any other concerns.

## 2015-07-10 ENCOUNTER — Other Ambulatory Visit: Payer: Self-pay | Admitting: Oncology

## 2015-07-10 ENCOUNTER — Telehealth: Payer: Self-pay | Admitting: *Deleted

## 2015-07-10 ENCOUNTER — Encounter: Payer: Self-pay | Admitting: *Deleted

## 2015-07-10 ENCOUNTER — Other Ambulatory Visit: Payer: Self-pay | Admitting: *Deleted

## 2015-07-10 DIAGNOSIS — C61 Malignant neoplasm of prostate: Secondary | ICD-10-CM

## 2015-07-10 NOTE — Telephone Encounter (Signed)
Voicemail: "I went to the hospital last night.  They told me I have cancer in my kegs.  Please call [phone]."

## 2015-07-10 NOTE — Progress Notes (Signed)
Patient calling to say he went to the E.R.last night, with c/o uncontrolled pain in right femur, taking ibuprofen and tylenol.  X-ray shows mets right femur. Per dr Alen Blew, rad onc referral made. Patient notified.

## 2015-07-11 ENCOUNTER — Encounter: Payer: Self-pay | Admitting: *Deleted

## 2015-07-11 ENCOUNTER — Telehealth: Payer: Self-pay | Admitting: *Deleted

## 2015-07-11 ENCOUNTER — Encounter (HOSPITAL_COMMUNITY): Payer: Self-pay | Admitting: Emergency Medicine

## 2015-07-11 ENCOUNTER — Emergency Department (HOSPITAL_COMMUNITY)
Admission: EM | Admit: 2015-07-11 | Discharge: 2015-07-11 | Disposition: A | Discharge status: Home / Self Care | Payer: PPO | Source: Home / Self Care | Attending: Physician Assistant | Admitting: Physician Assistant

## 2015-07-11 DIAGNOSIS — R000 Tachycardia, unspecified: Secondary | ICD-10-CM

## 2015-07-11 DIAGNOSIS — Z79899 Other long term (current) drug therapy: Secondary | ICD-10-CM

## 2015-07-11 DIAGNOSIS — R040 Epistaxis: Secondary | ICD-10-CM | POA: Insufficient documentation

## 2015-07-11 DIAGNOSIS — E119 Type 2 diabetes mellitus without complications: Secondary | ICD-10-CM

## 2015-07-11 DIAGNOSIS — Z7902 Long term (current) use of antithrombotics/antiplatelets: Secondary | ICD-10-CM

## 2015-07-11 DIAGNOSIS — Z7982 Long term (current) use of aspirin: Secondary | ICD-10-CM

## 2015-07-11 DIAGNOSIS — I10 Essential (primary) hypertension: Secondary | ICD-10-CM

## 2015-07-11 DIAGNOSIS — Z8673 Personal history of transient ischemic attack (TIA), and cerebral infarction without residual deficits: Secondary | ICD-10-CM

## 2015-07-11 DIAGNOSIS — Z8546 Personal history of malignant neoplasm of prostate: Secondary | ICD-10-CM | POA: Insufficient documentation

## 2015-07-11 LAB — COMPREHENSIVE METABOLIC PANEL
ALT: 19 U/L (ref 17–63)
ANION GAP: 9 (ref 5–15)
AST: 27 U/L (ref 15–41)
Albumin: 3.3 g/dL — ABNORMAL LOW (ref 3.5–5.0)
Alkaline Phosphatase: 432 U/L — ABNORMAL HIGH (ref 38–126)
BUN: 9 mg/dL (ref 6–20)
CHLORIDE: 99 mmol/L — AB (ref 101–111)
CO2: 25 mmol/L (ref 22–32)
Calcium: 9.1 mg/dL (ref 8.9–10.3)
Creatinine, Ser: 0.44 mg/dL — ABNORMAL LOW (ref 0.61–1.24)
Glucose, Bld: 305 mg/dL — ABNORMAL HIGH (ref 65–99)
POTASSIUM: 4 mmol/L (ref 3.5–5.1)
SODIUM: 133 mmol/L — AB (ref 135–145)
Total Bilirubin: 0.7 mg/dL (ref 0.3–1.2)
Total Protein: 6.3 g/dL — ABNORMAL LOW (ref 6.5–8.1)

## 2015-07-11 LAB — CBC WITH DIFFERENTIAL/PLATELET
Basophils Absolute: 0 10*3/uL (ref 0.0–0.1)
Basophils Relative: 0 %
EOS PCT: 0 %
Eosinophils Absolute: 0 10*3/uL (ref 0.0–0.7)
HEMATOCRIT: 23.3 % — AB (ref 39.0–52.0)
HEMOGLOBIN: 7.6 g/dL — AB (ref 13.0–17.0)
LYMPHS ABS: 1.4 10*3/uL (ref 0.7–4.0)
LYMPHS PCT: 5 %
MCH: 26.9 pg (ref 26.0–34.0)
MCHC: 32.6 g/dL (ref 30.0–36.0)
MCV: 82.3 fL (ref 78.0–100.0)
MONOS PCT: 4 %
Monocytes Absolute: 1.1 10*3/uL — ABNORMAL HIGH (ref 0.1–1.0)
NEUTROS ABS: 24.5 10*3/uL — AB (ref 1.7–7.7)
Neutrophils Relative %: 91 %
Platelets: 85 10*3/uL — ABNORMAL LOW (ref 150–400)
RBC: 2.83 MIL/uL — AB (ref 4.22–5.81)
RDW: 16.3 % — AB (ref 11.5–15.5)
WBC: 27 10*3/uL — AB (ref 4.0–10.5)

## 2015-07-11 MED ORDER — OXYMETAZOLINE HCL 0.05 % NA SOLN
1.0000 | Freq: Once | NASAL | Status: AC
  Administered 2015-07-11: 1 via NASAL
  Filled 2015-07-11: qty 15

## 2015-07-11 MED ORDER — SODIUM CHLORIDE 0.9 % IV BOLUS (SEPSIS)
500.0000 mL | Freq: Once | INTRAVENOUS | Status: AC
  Administered 2015-07-11: 500 mL via INTRAVENOUS

## 2015-07-11 MED ORDER — HEPARIN SOD (PORK) LOCK FLUSH 100 UNIT/ML IV SOLN
500.0000 [IU] | Freq: Once | INTRAVENOUS | Status: AC
  Administered 2015-07-11: 500 [IU]
  Filled 2015-07-11: qty 5

## 2015-07-11 NOTE — ED Notes (Addendum)
Ambulated patient around nursing station. Patient's heart rate ranged from 113-118. Patient ambulated well.  MD, Thomasene Lot, made aware.

## 2015-07-11 NOTE — Telephone Encounter (Signed)
"  I couldn't get to the phone in time and missed a call.  My leg is killing me and I need some help for pain in my leg."  Advised referral has been made to Radiation Oncology.  "But they didn't tell me when."  Advised it hasn't been scheduled yet.  Radiation will call when appointments scheduled.  Thanked me for letting him know.

## 2015-07-11 NOTE — Telephone Encounter (Signed)
Randolm Idol, RN at 07/10/2015 12:03 PM     Status: Signed       Expand All Collapse All   Patient calling to say he went to the E.R.last night, with c/o uncontrolled pain in right femur, taking ibuprofen and tylenol. X-ray shows mets right femur. Per dr Alen Blew, rad onc referral made. Patient notified.

## 2015-07-11 NOTE — ED Provider Notes (Signed)
CSN: [id number]     Arrival date & time 07/11/15  0906 History   First MD Initiated Contact with Patient 07/11/15 [phone]     Chief Complaint  Patient presents with  . Epistaxis     (Consider location/radiation/quality/duration/timing/severity/associated sxs/prior Treatment) HPI   Patient is a 69 year old male on Plavix presenting with a nosebleed this morning. It is since resolved. Patient said he woke up this morning and had a dry nose. Had a little bit of bleeding that would not stop. Patient change clothing came here to the ED put a Kleenex in his nose and came here to ED. The kleenex, has been there for over an hour and half. He had  no more bleeding. Patient has no evidence of bleeding on his shirts. Patient had no vomiting or coughing up of blood. She is asymptomatic otherwise.  Past Medical History  Diagnosis Date  . Stroke ([place])   . Diabetes mellitus   . Hypertension   . Prostate cancer [facility])    Past Surgical History  Procedure Laterality Date  . Eye surgery     Family History  Problem Relation Age of Onset  . Cancer Mother   . Hypertension Mother   . Hypertension Father   . Cancer Father   . Diabetes Sister   . Hypertension Sister   . Hypertension Brother   . Diabetes Brother    Social History  Substance Use Topics  . Smoking status: Never Smoker   . Smokeless tobacco: None  . Alcohol Use: No    Review of Systems  Constitutional: Negative for activity change.  HENT: Positive for nosebleeds.   Respiratory: Negative for shortness of breath.   Cardiovascular: Negative for chest pain.  Gastrointestinal: Negative for abdominal pain.  All other systems reviewed and are negative.     Allergies  Review of patient's allergies indicates no known allergies.  Home Medications   Prior to Admission medications   Medication Sig Start Date End Date Taking? Authorizing Provider  acetaminophen (TYLENOL) 500 MG tablet Take 1,000 mg by mouth every 6 (six) hours as  needed (for pain.).   Yes Historical Provider, MD  aspirin 81 MG chewable tablet Chew 1 tablet (81 mg total) by mouth daily. 06/14/15  Yes Verlee Monte, MD  atorvastatin (LIPITOR) 40 MG tablet Take 1 tablet (40 mg total) by mouth at bedtime. 06/14/15  Yes Verlee Monte, MD  cholecalciferol (VITAMIN D) 400 UNITS TABS tablet Take 400 Units by mouth daily.   Yes Historical Provider, MD  clopidogrel (PLAVIX) 75 MG tablet Take 1 tablet (75 mg total) by mouth daily with breakfast. 06/14/15  Yes Verlee Monte, MD  glipiZIDE (GLUCOTROL) 10 MG tablet Take 1 tablet (10 mg total) by mouth daily before breakfast. 06/14/15  Yes Verlee Monte, MD  lidocaine-prilocaine (EMLA) cream Apply 1 application topically as needed. 05/23/15  Yes Wyatt Portela, MD  losartan (COZAAR) 50 MG tablet Take 1 tablet (50 mg total) by mouth daily. 06/14/15  Yes Verlee Monte, MD  metFORMIN (GLUCOPHAGE) 1000 MG tablet Take 1 tablet (1,000 mg total) by mouth 2 (two) times daily. 06/14/15  Yes Verlee Monte, MD  oxyCODONE-acetaminophen (PERCOCET/ROXICET) 5-325 MG tablet Take 1 tablet by mouth every 6 (six) hours as needed for severe pain. 07/09/15  Yes Domenic Moras, PA-C  prochlorperazine (COMPAZINE) 10 MG tablet Take 1 tablet (10 mg total) by mouth every 6 (six) hours as needed for nausea or vomiting. 06/28/15  Yes Wyatt Portela, MD   BP 185/77  mmHg  Pulse 109  Temp(Src) 98.6 F (37 C) (Oral)  Resp 18  Ht 5\' 6"  (1.676 m)  Wt 125 lb (56.7 kg)  BMI 20.19 kg/m2  SpO2 94% Physical Exam  Constitutional: He is oriented to person, place, and time. He appears well-nourished.  HENT:  Head: Normocephalic.  Left nasal turbinate shows evidence of recent bleed, no current bleeding.  Eyes: Conjunctivae are normal.  Cardiovascular: Normal rate.   Pulmonary/Chest: Effort normal.  Neurological: He is oriented to person, place, and time.  Skin: Skin is warm and dry. He is not diaphoretic.  Psychiatric: He has a normal mood and affect. His  behavior is normal.    ED Course  Procedures (including critical care time) Labs Review Labs Reviewed  COMPREHENSIVE METABOLIC PANEL - Abnormal; Notable for the following:    Sodium 133 (*)    Chloride 99 (*)    Glucose, Bld 305 (*)    Creatinine, Ser 0.44 (*)    Total Protein 6.3 (*)    Albumin 3.3 (*)    Alkaline Phosphatase 432 (*)    All other components within normal limits  CBC WITH DIFFERENTIAL/PLATELET - Abnormal; Notable for the following:    WBC 27.0 (*)    RBC 2.83 (*)    Hemoglobin 7.6 (*)    HCT 23.3 (*)    RDW 16.3 (*)    Platelets 85 (*)    Neutro Abs 24.5 (*)    Monocytes Absolute 1.1 (*)    All other components within normal limits    Imaging Review Dg Femur Min 2 Views Left  07/09/2015  CLINICAL DATA:  Prostate cancer.  BILATERAL leg pain. EXAM: LEFT FEMUR 2 VIEWS COMPARISON:  Bone scan 05/01/2015. FINDINGS: No significant sclerotic or lytic lesions are observed in the LEFT femur. There is no fracture. The adjacent LEFT ischium demonstrates sclerosis, similar to the distribution noted on previous nuclear medicine scintigraphy scan. Soft tissue calcification adjacent to the lesser trochanter. IMPRESSION: No visible LEFT femur metastases or acute findings. Electronically Signed   By: Staci Righter M.D.   On: 07/09/2015 19:10   Dg Femur, Min 2 Views Right  07/09/2015  CLINICAL DATA:  BILATERAL leg pain for 4-5 months. History of prostate cancer. EXAM: RIGHT FEMUR 2 VIEWS COMPARISON:  Bone scan 05/01/2015.  LEFT femur reported separately. FINDINGS: Sclerotic metastases are noted throughout the proximal RIGHT femur, including the greater and lesser trochanter, femoral neck, and femoral head. The adjacent pelvis is also affected in the acetabular region, iliac waiting, and superior pubic ramus. No pathologic fracture is evident. Vascular calcification. IMPRESSION: Sclerotic bone metastases are observed, similar in distribution to the prior nuclear medicine scintigraphy  scan. No pathologic fracture. Electronically Signed   By: Staci Righter M.D.   On: 07/09/2015 19:07   I have personally reviewed and evaluated these images and lab results as part of my medical decision-making.   EKG Interpretation None      MDM   Final diagnoses:  Epistaxis   Patient is a 69 year old male on Plavix presenting with resolved nosebleed. We took a Kleenex that was in in his nose for the last hour and half and replaced it with a cotton ball with Afrin to reinforce that it has clotted. We'll wait 15 minutes and then remove. I don't believe patient lost a lot of blood given the fact that he has no blood on his shirt, his Kleenex has been clear for an hour and half and has not been vomiting  or coughing up any blood at all today. We will remove cotton ball and have patient try ambulation to make sure that he is not dizzy or symptomatic from losing blood.   10:46 AM Patient mildly tachycardic to 112 at rest. Concern that this could be a sign that he lost blood from this nose bleed. We will get CBC Chem-7 and give him 500 mL of fluids. Patient says he felt dizzy when he first arrived but now feels much better.  12:25 PM Patient able to a steady on his feet. Patient has no symptoms. No dizziness.. Patient still has mild tachycardic cardiac to 107. In looking at prior notes it appears that that is his resting heart rate. Given he has no symptoms and has no evidence of blood loss given his normal hemoglobin we will discharge home and have him follow-up as needed. Patient has no chest pain and has not had any chest pain.  Jackalynn Art Julio Alm, MD 07/11/15 1225

## 2015-07-11 NOTE — Telephone Encounter (Signed)
@   1330 received voice mail from patient. He was crying, stating his pain was unbearable. i called patient left messages for him twice.

## 2015-07-11 NOTE — ED Notes (Signed)
MD at bedside. 

## 2015-07-11 NOTE — Discharge Instructions (Signed)

## 2015-07-11 NOTE — ED Notes (Signed)
Discharge instructions and follow up care reviewed with patient. Patient verbalized understanding. 

## 2015-07-11 NOTE — Progress Notes (Signed)
No note

## 2015-07-11 NOTE — ED Notes (Signed)
Per pt, states he has been blowing nose a lot and living with dry heat-nose bleed this am-bleeding contained at this time-on blood thinners

## 2015-07-12 ENCOUNTER — Encounter (HOSPITAL_COMMUNITY): Payer: Self-pay | Admitting: Emergency Medicine

## 2015-07-12 ENCOUNTER — Ambulatory Visit
Admission: RE | Admit: 2015-07-12 | Discharge: 2015-07-12 | Disposition: A | Discharge status: Home / Self Care | Payer: PPO | Source: Ambulatory Visit | Attending: Radiation Oncology | Admitting: Radiation Oncology

## 2015-07-12 ENCOUNTER — Emergency Department (HOSPITAL_COMMUNITY): Payer: PPO

## 2015-07-12 ENCOUNTER — Encounter: Payer: Self-pay | Admitting: *Deleted

## 2015-07-12 ENCOUNTER — Ambulatory Visit: Admission: RE | Admit: 2015-07-12 | Payer: PPO | Source: Ambulatory Visit

## 2015-07-12 ENCOUNTER — Inpatient Hospital Stay (HOSPITAL_COMMUNITY)
Admission: EM | Admit: 2015-07-12 | Discharge: 2015-07-19 | DRG: 871 | Disposition: A | Discharge status: Skilled Nursing Facility | Payer: PPO | Attending: Internal Medicine | Admitting: Internal Medicine

## 2015-07-12 ENCOUNTER — Ambulatory Visit: Admit: 2015-07-12 | Payer: PPO | Admitting: Radiation Oncology

## 2015-07-12 DIAGNOSIS — Z7902 Long term (current) use of antithrombotics/antiplatelets: Secondary | ICD-10-CM

## 2015-07-12 DIAGNOSIS — E1165 Type 2 diabetes mellitus with hyperglycemia: Secondary | ICD-10-CM | POA: Diagnosis present

## 2015-07-12 DIAGNOSIS — C7951 Secondary malignant neoplasm of bone: Secondary | ICD-10-CM | POA: Diagnosis present

## 2015-07-12 DIAGNOSIS — R64 Cachexia: Secondary | ICD-10-CM | POA: Diagnosis present

## 2015-07-12 DIAGNOSIS — A419 Sepsis, unspecified organism: Secondary | ICD-10-CM | POA: Diagnosis present

## 2015-07-12 DIAGNOSIS — E43 Unspecified severe protein-calorie malnutrition: Secondary | ICD-10-CM | POA: Diagnosis present

## 2015-07-12 DIAGNOSIS — E871 Hypo-osmolality and hyponatremia: Secondary | ICD-10-CM | POA: Diagnosis present

## 2015-07-12 DIAGNOSIS — C61 Malignant neoplasm of prostate: Secondary | ICD-10-CM | POA: Diagnosis present

## 2015-07-12 DIAGNOSIS — R52 Pain, unspecified: Secondary | ICD-10-CM | POA: Diagnosis not present

## 2015-07-12 DIAGNOSIS — E1140 Type 2 diabetes mellitus with diabetic neuropathy, unspecified: Secondary | ICD-10-CM | POA: Diagnosis present

## 2015-07-12 DIAGNOSIS — E876 Hypokalemia: Secondary | ICD-10-CM | POA: Diagnosis present

## 2015-07-12 DIAGNOSIS — T402X5A Adverse effect of other opioids, initial encounter: Secondary | ICD-10-CM | POA: Diagnosis present

## 2015-07-12 DIAGNOSIS — I5032 Chronic diastolic (congestive) heart failure: Secondary | ICD-10-CM | POA: Diagnosis present

## 2015-07-12 DIAGNOSIS — Z7984 Long term (current) use of oral hypoglycemic drugs: Secondary | ICD-10-CM

## 2015-07-12 DIAGNOSIS — Z510 Encounter for antineoplastic radiation therapy: Secondary | ICD-10-CM | POA: Insufficient documentation

## 2015-07-12 DIAGNOSIS — J9601 Acute respiratory failure with hypoxia: Secondary | ICD-10-CM | POA: Diagnosis not present

## 2015-07-12 DIAGNOSIS — R040 Epistaxis: Secondary | ICD-10-CM | POA: Diagnosis present

## 2015-07-12 DIAGNOSIS — G893 Neoplasm related pain (acute) (chronic): Secondary | ICD-10-CM

## 2015-07-12 DIAGNOSIS — Z9221 Personal history of antineoplastic chemotherapy: Secondary | ICD-10-CM

## 2015-07-12 DIAGNOSIS — Z8673 Personal history of transient ischemic attack (TIA), and cerebral infarction without residual deficits: Secondary | ICD-10-CM | POA: Diagnosis not present

## 2015-07-12 DIAGNOSIS — Z7982 Long term (current) use of aspirin: Secondary | ICD-10-CM | POA: Diagnosis not present

## 2015-07-12 DIAGNOSIS — T451X5A Adverse effect of antineoplastic and immunosuppressive drugs, initial encounter: Secondary | ICD-10-CM | POA: Diagnosis present

## 2015-07-12 DIAGNOSIS — K5903 Drug induced constipation: Secondary | ICD-10-CM | POA: Diagnosis present

## 2015-07-12 DIAGNOSIS — Z6820 Body mass index (BMI) 20.0-20.9, adult: Secondary | ICD-10-CM

## 2015-07-12 DIAGNOSIS — I10 Essential (primary) hypertension: Secondary | ICD-10-CM | POA: Diagnosis present

## 2015-07-12 DIAGNOSIS — D696 Thrombocytopenia, unspecified: Secondary | ICD-10-CM | POA: Diagnosis present

## 2015-07-12 DIAGNOSIS — D638 Anemia in other chronic diseases classified elsewhere: Secondary | ICD-10-CM | POA: Diagnosis present

## 2015-07-12 DIAGNOSIS — J690 Pneumonitis due to inhalation of food and vomit: Secondary | ICD-10-CM | POA: Diagnosis present

## 2015-07-12 DIAGNOSIS — IMO0002 Reserved for concepts with insufficient information to code with codable children: Secondary | ICD-10-CM | POA: Diagnosis present

## 2015-07-12 DIAGNOSIS — Z6821 Body mass index (BMI) 21.0-21.9, adult: Secondary | ICD-10-CM | POA: Diagnosis not present

## 2015-07-12 DIAGNOSIS — J189 Pneumonia, unspecified organism: Secondary | ICD-10-CM

## 2015-07-12 DIAGNOSIS — D6481 Anemia due to antineoplastic chemotherapy: Secondary | ICD-10-CM | POA: Diagnosis present

## 2015-07-12 DIAGNOSIS — D630 Anemia in neoplastic disease: Secondary | ICD-10-CM | POA: Diagnosis present

## 2015-07-12 DIAGNOSIS — D649 Anemia, unspecified: Secondary | ICD-10-CM | POA: Diagnosis present

## 2015-07-12 DIAGNOSIS — Z515 Encounter for palliative care: Secondary | ICD-10-CM | POA: Diagnosis present

## 2015-07-12 DIAGNOSIS — R000 Tachycardia, unspecified: Secondary | ICD-10-CM | POA: Diagnosis present

## 2015-07-12 LAB — CBC WITH DIFFERENTIAL/PLATELET
BASOS ABS: 0 10*3/uL (ref 0.0–0.1)
Basophils Relative: 0 %
EOS PCT: 0 %
Eosinophils Absolute: 0 10*3/uL (ref 0.0–0.7)
HCT: 22 % — ABNORMAL LOW (ref 39.0–52.0)
HEMOGLOBIN: 7.3 g/dL — AB (ref 13.0–17.0)
LYMPHS PCT: 3 %
Lymphs Abs: 0.9 10*3/uL (ref 0.7–4.0)
MCH: 27 pg (ref 26.0–34.0)
MCHC: 33.2 g/dL (ref 30.0–36.0)
MCV: 81.5 fL (ref 78.0–100.0)
MONOS PCT: 3 %
Monocytes Absolute: 0.9 10*3/uL (ref 0.1–1.0)
NEUTROS PCT: 94 %
Neutro Abs: 29 10*3/uL — ABNORMAL HIGH (ref 1.7–7.7)
PLATELETS: 121 10*3/uL — AB (ref 150–400)
RBC: 2.7 MIL/uL — AB (ref 4.22–5.81)
RDW: 16.3 % — ABNORMAL HIGH (ref 11.5–15.5)
WBC: 30.8 10*3/uL — AB (ref 4.0–10.5)

## 2015-07-12 LAB — BASIC METABOLIC PANEL
Anion gap: 12 (ref 5–15)
BUN: 10 mg/dL (ref 6–20)
CO2: 24 mmol/L (ref 22–32)
Calcium: 9 mg/dL (ref 8.9–10.3)
Chloride: 97 mmol/L — ABNORMAL LOW (ref 101–111)
Creatinine, Ser: 0.51 mg/dL — ABNORMAL LOW (ref 0.61–1.24)
GFR calc Af Amer: >60 mL/min (ref >60)
GFR calc non Af Amer: >60 mL/min (ref >60)
Glucose, Bld: 343 mg/dL — ABNORMAL HIGH (ref 65–99)
Potassium: 3.9 mmol/L (ref 3.5–5.1)
Sodium: 133 mmol/L — ABNORMAL LOW (ref 135–145)

## 2015-07-12 LAB — GLUCOSE, CAPILLARY: Glucose-Capillary: 307 mg/dL — ABNORMAL HIGH (ref 65–99)

## 2015-07-12 LAB — PREPARE RBC (CROSSMATCH)

## 2015-07-12 LAB — I-STAT TROPONIN, ED: TROPONIN I, POC: 0.02 ng/mL (ref 0.00–0.08)

## 2015-07-12 LAB — I-STAT CG4 LACTIC ACID, ED: LACTIC ACID, VENOUS: 1.05 mmol/L (ref 0.5–2.0)

## 2015-07-12 MED ORDER — VANCOMYCIN HCL IN DEXTROSE 750-5 MG/150ML-% IV SOLN
750.0000 mg | Freq: Two times a day (BID) | INTRAVENOUS | Status: DC
  Administered 2015-07-13 – 2015-07-15 (×6): 750 mg via INTRAVENOUS
  Filled 2015-07-12 (×8): qty 150

## 2015-07-12 MED ORDER — PIPERACILLIN-TAZOBACTAM 3.375 G IVPB
3.3750 g | Freq: Three times a day (TID) | INTRAVENOUS | Status: DC
  Administered 2015-07-12 – 2015-07-15 (×8): 3.375 g via INTRAVENOUS
  Filled 2015-07-12 (×7): qty 50

## 2015-07-12 MED ORDER — SODIUM CHLORIDE 0.9 % IV SOLN
INTRAVENOUS | Status: DC
  Administered 2015-07-12: 23:00:00 via INTRAVENOUS

## 2015-07-12 MED ORDER — OXYCODONE HCL 5 MG PO TABS
10.0000 mg | ORAL_TABLET | ORAL | Status: DC | PRN
  Administered 2015-07-12 – 2015-07-17 (×18): 10 mg via ORAL
  Filled 2015-07-12 (×18): qty 2

## 2015-07-12 MED ORDER — INSULIN ASPART 100 UNIT/ML ~~LOC~~ SOLN
0.0000 [IU] | Freq: Every day | SUBCUTANEOUS | Status: DC
  Administered 2015-07-12: 4 [IU] via SUBCUTANEOUS
  Administered 2015-07-13 – 2015-07-15 (×2): 2 [IU] via SUBCUTANEOUS
  Administered 2015-07-16: 3 [IU] via SUBCUTANEOUS

## 2015-07-12 MED ORDER — ACETAMINOPHEN 325 MG PO TABS
650.0000 mg | ORAL_TABLET | Freq: Once | ORAL | Status: AC
  Administered 2015-07-12: 650 mg via ORAL
  Filled 2015-07-12: qty 2

## 2015-07-12 MED ORDER — ASPIRIN 81 MG PO CHEW
81.0000 mg | CHEWABLE_TABLET | Freq: Every day | ORAL | Status: DC
  Administered 2015-07-13 – 2015-07-19 (×7): 81 mg via ORAL
  Filled 2015-07-12 (×7): qty 1

## 2015-07-12 MED ORDER — DEXTROSE 5 % IV SOLN
1.0000 g | Freq: Once | INTRAVENOUS | Status: AC
  Administered 2015-07-12: 1 g via INTRAVENOUS
  Filled 2015-07-12: qty 10

## 2015-07-12 MED ORDER — CLOPIDOGREL BISULFATE 75 MG PO TABS
75.0000 mg | ORAL_TABLET | Freq: Every day | ORAL | Status: DC
  Administered 2015-07-13: 75 mg via ORAL
  Filled 2015-07-12: qty 1

## 2015-07-12 MED ORDER — GLIPIZIDE 10 MG PO TABS
10.0000 mg | ORAL_TABLET | Freq: Every day | ORAL | Status: DC
  Administered 2015-07-13: 10 mg via ORAL
  Filled 2015-07-12: qty 1

## 2015-07-12 MED ORDER — ATORVASTATIN CALCIUM 40 MG PO TABS
40.0000 mg | ORAL_TABLET | Freq: Every day | ORAL | Status: DC
  Administered 2015-07-12 – 2015-07-18 (×7): 40 mg via ORAL
  Filled 2015-07-12 (×7): qty 1

## 2015-07-12 MED ORDER — SODIUM CHLORIDE 0.9 % IV BOLUS (SEPSIS)
1000.0000 mL | Freq: Once | INTRAVENOUS | Status: AC
  Administered 2015-07-12: 1000 mL via INTRAVENOUS

## 2015-07-12 MED ORDER — INSULIN ASPART 100 UNIT/ML ~~LOC~~ SOLN
0.0000 [IU] | Freq: Three times a day (TID) | SUBCUTANEOUS | Status: DC
  Administered 2015-07-13: 2 [IU] via SUBCUTANEOUS
  Administered 2015-07-13: 7 [IU] via SUBCUTANEOUS
  Administered 2015-07-13: 5 [IU] via SUBCUTANEOUS
  Administered 2015-07-14: 7 [IU] via SUBCUTANEOUS
  Administered 2015-07-14: 1 [IU] via SUBCUTANEOUS
  Administered 2015-07-14: 5 [IU] via SUBCUTANEOUS
  Administered 2015-07-15: 2 [IU] via SUBCUTANEOUS
  Administered 2015-07-15: 7 [IU] via SUBCUTANEOUS
  Administered 2015-07-15: 5 [IU] via SUBCUTANEOUS
  Administered 2015-07-16: 1 [IU] via SUBCUTANEOUS
  Administered 2015-07-16: 3 [IU] via SUBCUTANEOUS
  Administered 2015-07-16: 2 [IU] via SUBCUTANEOUS
  Administered 2015-07-17: 3 [IU] via SUBCUTANEOUS
  Administered 2015-07-17: 1 [IU] via SUBCUTANEOUS
  Administered 2015-07-17: 2 [IU] via SUBCUTANEOUS
  Administered 2015-07-18: 9 [IU] via SUBCUTANEOUS

## 2015-07-12 MED ORDER — LOSARTAN POTASSIUM 50 MG PO TABS
50.0000 mg | ORAL_TABLET | Freq: Every day | ORAL | Status: DC
  Administered 2015-07-13 – 2015-07-19 (×7): 50 mg via ORAL
  Filled 2015-07-12 (×7): qty 1

## 2015-07-12 MED ORDER — DEXTROSE 5 % IV SOLN
500.0000 mg | Freq: Once | INTRAVENOUS | Status: AC
  Administered 2015-07-12: 500 mg via INTRAVENOUS
  Filled 2015-07-12: qty 500

## 2015-07-12 MED ORDER — SODIUM CHLORIDE 0.9 % IV SOLN
Freq: Once | INTRAVENOUS | Status: AC
  Administered 2015-07-13: via INTRAVENOUS

## 2015-07-12 MED ORDER — SODIUM CHLORIDE 0.9 % IJ SOLN
10.0000 mL | INTRAMUSCULAR | Status: DC | PRN
  Administered 2015-07-13: 20 mL
  Administered 2015-07-14 – 2015-07-19 (×4): 10 mL
  Filled 2015-07-12 (×5): qty 40

## 2015-07-12 MED ORDER — IOHEXOL 350 MG/ML SOLN
100.0000 mL | Freq: Once | INTRAVENOUS | Status: AC | PRN
  Administered 2015-07-12: 100 mL via INTRAVENOUS

## 2015-07-12 MED ORDER — SODIUM CHLORIDE 0.9 % IV BOLUS (SEPSIS)
500.0000 mL | Freq: Once | INTRAVENOUS | Status: AC
  Administered 2015-07-12: 500 mL via INTRAVENOUS

## 2015-07-12 MED ORDER — DIPHENHYDRAMINE HCL 25 MG PO CAPS
25.0000 mg | ORAL_CAPSULE | Freq: Once | ORAL | Status: AC
  Administered 2015-07-12: 25 mg via ORAL
  Filled 2015-07-12: qty 1

## 2015-07-12 NOTE — ED Notes (Signed)
Patient c/o cough x2 days with green, thick sputum.  Denies N/V/D.  Denies fevers.  Breathing is even and unlabored NAD at this time.

## 2015-07-12 NOTE — Progress Notes (Signed)
ANTIBIOTIC CONSULT NOTE - INITIAL  Pharmacy Consult for Vancomycin and Zosyn  Indication: pneumonia  No Known Allergies  Patient Measurements: Height: 5\' 6"  (167.6 cm) Weight: 125 lb 14.1 oz (57.1 kg) IBW/kg (Calculated) : 63.8 Adjusted Body Weight:   Vital Signs: Temp: 98.4 F (36.9 C) (11/23 2222) Temp Source: Oral (11/23 2222) BP: 169/61 mmHg (11/23 2222) Pulse Rate: 120 (11/23 2222) Intake/Output from previous day:   Intake/Output from this shift:    Labs:  Recent Labs  07/11/15 1110 07/12/15 1600  WBC 27.0* 30.8*  HGB 7.6* 7.3*  PLT 85* 121*  CREATININE 0.44* 0.51*   Estimated Creatinine Clearance: 73.4 mL/min (by C-G formula based on Cr of 0.51). No results for input(s): VANCOTROUGH, VANCOPEAK, VANCORANDOM, GENTTROUGH, GENTPEAK, GENTRANDOM, TOBRATROUGH, TOBRAPEAK, TOBRARND, AMIKACINPEAK, AMIKACINTROU, AMIKACIN in the last 72 hours.   Microbiology: No results found for this or any previous visit (from the past 720 hour(s)).  Medical History: Past Medical History  Diagnosis Date  . Stroke ([place])   . Diabetes mellitus   . Hypertension   . Prostate cancer ([place])     Medications:  Anti-infectives    Start     Dose/Rate Route Frequency Ordered Stop   07/12/15 2245  piperacillin-tazobactam (ZOSYN) IVPB 3.375 g     3.375 g 12.5 mL/hr over 240 Minutes Intravenous 3 times per day 07/12/15 2239     07/12/15 2245  vancomycin (VANCOCIN) IVPB 750 mg/150 ml premix     750 mg 150 mL/hr over 60 Minutes Intravenous 2 times daily 07/12/15 2239     07/12/15 1430  cefTRIAXone (ROCEPHIN) 1 g in dextrose 5 % 50 mL IVPB     1 g 100 mL/hr over 30 Minutes Intravenous  Once 07/12/15 1428 07/12/15 1654   07/12/15 1430  azithromycin (ZITHROMAX) 500 mg in dextrose 5 % 250 mL IVPB     500 mg 250 mL/hr over 60 Minutes Intravenous  Once 07/12/15 1428 07/12/15 1715     Assessment: Patient with metastatic prostate cancer in ED found to have aspiration pneumonia.  Goal of  Therapy:  Vancomycin trough level 15-20 mcg/ml  Zosyn based on renal function Appropriate antibiotic dosing for renal function; eradication of infection   Plan:  Measure antibiotic drug levels at steady state Follow up culture results Vancomycin 750mg  iv q12hr  Zosyn 3.375g IV Q8H infused over 4hrs.   Nani Skillern Crowford 07/12/2015,10:45 PM

## 2015-07-12 NOTE — ED Notes (Signed)
Per pt, states he started coughing up green mucus last night-states he is hungry bur cant eat

## 2015-07-12 NOTE — H&P (Addendum)
PCP: [name], MD  [place]  Referring provider Irena Cords   Chief Complaint:  Cough with green sputum  HPI: Ricky Terry is a 69 y.o. male   has a past medical history of Stroke ([place]); Diabetes mellitus; Hypertension; and Prostate cancer ([place]).   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 November complicated by symptomatic anemia requiring blood transfusion in October. Examination have had significant femur pain was trying to use ibuprofen to control it but is not any relief patient presented to emergency department on November 20 secondary severe pain and was found to have unchanged sclerotic bony metastases. He was noted to have somewhat lower hemoglobin down to 7.4 evidence of hyperglycemia to 400 the plan was for patient to receive IV fluids and be reevaluated. Patient is currently receiving Neulasta on steroids which were explaining his leukocytosis. At that time referral to radiation oncology was made he was supposed to be seen today but instead presented to ER. Yesterday patient presented back to emergency department epistaxis been on Plavix. The time of her reevaluation his bleeding have stopped and he was discharged to home. Today patient presented back to emergency room again complaining of cough of green mucus decreased appetite for the past 2 days he did not endorse any fevers or chills have not been lightheaded no shortness of breath. Given persistent tachycardia up to 120s CTA of the chest was done showing multiple sclerotic lesion thoracic spine multiple in defined airspace opacities in the right middle lower lobes consistent multifocal pneumonia and also evidence of diabetes and lower lobe bronchial worrisome for mucus plugging or aspiration. He denies cough after eating.  Hospitalist was called for  admission for possible aspiration pneumonia  Review of Systems:    Pertinent positives include:  excess mucus,  productive cough,   Constitutional:  No weight loss, night sweats, Fevers, chills, fatigue, weight loss  HEENT:  No headaches, Difficulty swallowing,Tooth/dental problems,Sore throat,  No sneezing, itching, ear ache, nasal congestion, post nasal drip,  Cardio-vascular:  No chest pain, Orthopnea, PND, anasarca, dizziness, palpitations. no Bilateral lower extremity swelling  GI:  No heartburn, indigestion, abdominal pain, nausea, vomiting, diarrhea, change in bowel habits, loss of appetite, melena, blood in stool, hematemesis Resp:  no shortness of breath at rest. No dyspnea on exertion, No non-productive cough, No coughing up of blood.No change in color of mucus.No wheezing. Skin:  no rash or lesions. No jaundice GU:  no dysuria, change in color of urine, no urgency or frequency. No straining to urinate.  No flank pain.  Musculoskeletal:  No joint pain or no joint swelling. No decreased range of motion. No back pain.  Psych:  No change in mood or affect. No depression or anxiety. No memory loss.  Neuro: no localizing neurological complaints, no tingling, no weakness, no double vision, no gait abnormality, no slurred speech, no confusion  Otherwise ROS are negative except for above, 10 systems were reviewed  Past Medical History: Past Medical History  Diagnosis Date  . Stroke ([place])   . Diabetes mellitus   . Hypertension   . Prostate cancer [facility])    Past Surgical History  Procedure Laterality Date  . Eye surgery       Medications: Prior to Admission medications   Medication Sig Start Date End Date Taking? Authorizing Provider  aspirin 81 MG chewable tablet Chew  1 tablet (81 mg total) by mouth daily. 06/14/15  Yes Verlee Monte, MD  atorvastatin (LIPITOR) 40 MG tablet Take 1 tablet (40 mg total) by mouth at bedtime. 06/14/15  Yes Verlee Monte, MD  cholecalciferol  (VITAMIN D) 400 UNITS TABS tablet Take 400 Units by mouth daily.   Yes Historical Provider, MD  clopidogrel (PLAVIX) 75 MG tablet Take 1 tablet (75 mg total) by mouth daily with breakfast. 06/14/15  Yes Verlee Monte, MD  glipiZIDE (GLUCOTROL) 10 MG tablet Take 1 tablet (10 mg total) by mouth daily before breakfast. 06/14/15  Yes Verlee Monte, MD  lidocaine-prilocaine (EMLA) cream Apply 1 application topically as needed. 05/23/15  Yes Wyatt Portela, MD  losartan (COZAAR) 50 MG tablet Take 1 tablet (50 mg total) by mouth daily. 06/14/15  Yes Verlee Monte, MD  metFORMIN (GLUCOPHAGE) 1000 MG tablet Take 1 tablet (1,000 mg total) by mouth 2 (two) times daily. 06/14/15  Yes Verlee Monte, MD  oxyCODONE-acetaminophen (PERCOCET/ROXICET) 5-325 MG tablet Take 1 tablet by mouth every 6 (six) hours as needed for severe pain. 07/09/15  Yes Domenic Moras, PA-C  Pegfilgrastim (NEULASTA [place]) Inject 1 Dose into the skin.   Yes Historical Provider, MD  prochlorperazine (COMPAZINE) 10 MG tablet Take 1 tablet (10 mg total) by mouth every 6 (six) hours as needed for nausea or vomiting. 06/28/15  Yes Wyatt Portela, MD    Allergies:  No Known Allergies  Social History:  Ambulatory   Independently  Lives at home alone,         reports that he has never smoked. He does not have any smokeless tobacco history on file. He reports that he does not drink alcohol or use illicit drugs.    Family History: family history includes Cancer in his father and mother; Diabetes in his brother and sister; Hypertension in his brother, father, mother, and sister.    Physical Exam: Patient Vitals for the past 24 hrs:  BP Temp Temp src Pulse Resp SpO2  07/12/15 2000 183/84 mmHg - - (!) 123 25 97 %  07/12/15 1930 138/100 mmHg - - (!) 130 25 96 %  07/12/15 1920 167/78 mmHg 98.3 F (36.8 C) Oral (!) 124 24 95 %  07/12/15 1917 167/78 mmHg - - (!) 125 (!) 34 96 %  07/12/15 1722 186/79 mmHg - - (!) 127 20 97 %  07/12/15 1615 - 100.2 F  (37.9 C) Rectal - - -  07/12/15 1443 164/65 mmHg - Oral (!) 122 20 97 %  07/12/15 1331 141/66 mmHg 97.5 F (36.4 C) Oral (!) 123 18 98 %    1. General:  in No Acute distress 2. Psychological: Alert and   Oriented 3. Head/ENT:     Dry Mucous Membranes                               Right eye prosthetic                           Head Non traumatic, neck supple                          Normal   Dentition 4. SKIN: normal  Skin turgor,  Skin clean Dry and intact no rash 5. Heart: rapid Regular rate and rhythm no Murmur, Rub or gallop 6. Lungs: Clear to auscultation bilaterally, no wheezes  or crackles   7. Abdomen: Soft, non-tender, Non distended 8. Lower extremities: no clubbing, cyanosis, or edema 9. Neurologically Grossly intact, moving all 4 extremities equally 10. MSK: Normal range of motion  body mass index is unknown because there is no weight on file.   Labs on Admission:   Results for orders placed or performed during the hospital encounter of 07/12/15 (from the past 24 hour(s))  CBC with Differential/Platelet     Status: Abnormal   Collection Time: 07/12/15  4:00 PM  Result Value Ref Range   WBC 30.8 (H) 4.0 - 10.5 K/uL   RBC 2.70 (L) 4.22 - 5.81 MIL/uL   Hemoglobin 7.3 (L) 13.0 - 17.0 g/dL   HCT 22.0 (L) 39.0 - 52.0 %   MCV 81.5 78.0 - 100.0 fL   MCH 27.0 26.0 - 34.0 pg   MCHC 33.2 30.0 - 36.0 g/dL   RDW 16.3 (H) 11.5 - 15.5 %   Platelets 121 (L) 150 - 400 K/uL   Neutrophils Relative % 94 %   Lymphocytes Relative 3 %   Monocytes Relative 3 %   Eosinophils Relative 0 %   Basophils Relative 0 %   Neutro Abs 29.0 (H) 1.7 - 7.7 K/uL   Lymphs Abs 0.9 0.7 - 4.0 K/uL   Monocytes Absolute 0.9 0.1 - 1.0 K/uL   Eosinophils Absolute 0.0 0.0 - 0.7 K/uL   Basophils Absolute 0.0 0.0 - 0.1 K/uL   RBC Morphology POLYCHROMASIA PRESENT    WBC Morphology MILD LEFT SHIFT (1-5% METAS, OCC MYELO, OCC BANDS)    Smear Review LARGE PLATELETS PRESENT   Basic metabolic panel     Status:  Abnormal   Collection Time: 07/12/15  4:00 PM  Result Value Ref Range   Sodium 133 (L) 135 - 145 mmol/L   Potassium 3.9 3.5 - 5.1 mmol/L   Chloride 97 (L) 101 - 111 mmol/L   CO2 24 22 - 32 mmol/L   Glucose, Bld 343 (H) 65 - 99 mg/dL   BUN 10 6 - 20 mg/dL   Creatinine, Ser 0.51 (L) 0.61 - 1.24 mg/dL   Calcium 9.0 8.9 - 10.3 mg/dL   GFR calc non Af Amer >60 >60 mL/min   GFR calc Af Amer >60 >60 mL/min   Anion gap 12 5 - 15  I-Stat CG4 Lactic Acid, ED     Status: None   Collection Time: 07/12/15  4:03 PM  Result Value Ref Range   Lactic Acid, Venous 1.05 0.5 - 2.0 mmol/L  I-stat troponin, ED     Status: None   Collection Time: 07/12/15  4:03 PM  Result Value Ref Range   Troponin i, poc 0.02 0.00 - 0.08 ng/mL   Comment 3            UA not obtained  Lab Results  Component Value Date   HGBA1C 14.6* 06/13/2015    Estimated Creatinine Clearance: 72.8 mL/min (by C-G formula based on Cr of 0.51).  BNP (last 3 results) No results for input(s): PROBNP in the last 8760 hours.  Other results:  I have pearsonaly reviewed this: ECG REPORT  Rate:122  Rhythm: sinus tachycardia ST&T Change: T wave inversion QTC 437  There were no vitals filed for this visit.   Cultures: No results found for: SDES, [place], CULT, REPTSTATUS   Radiological Exams on Admission: Dg Chest 2 View  07/12/2015  CLINICAL DATA:  Two day history of productive cough. Hypertension. History prostate carcinoma. EXAM: CHEST  2 VIEW COMPARISON:  Chest radiograph June 12, 2015; chest CT June 13, 2015 FINDINGS: There is patchy infiltrate in the right lower lobe. The lungs elsewhere are clear. Heart size and pulmonary vascularity are normal. No adenopathy. Port-A-Cath tip is in the superior vena cava. No pneumothorax. Irregular sclerosis in the left humeral head region is stable. Sclerosis in the T7 vertebral body is likewise stable. IMPRESSION: Right lower lobe infiltrate consistent with pneumonia. Lungs  elsewhere clear. Areas of sclerotic bony metastatic disease. Electronically Signed   By: Lowella Grip III M.D.   On: 07/12/2015 14:20   Ct Angio Chest Pe W/cm &/or Wo Cm  07/12/2015  CLINICAL DATA:  Cough, congestion. Tachycardia. Current history of metastatic prostate cancer. EXAM: CT ANGIOGRAPHY CHEST WITH CONTRAST TECHNIQUE: Multidetector CT imaging of the chest was performed using the standard protocol during bolus administration of intravenous contrast. Multiplanar CT image reconstructions and MIPs were obtained to evaluate the vascular anatomy. CONTRAST:  179mL OMNIPAQUE IOHEXOL 350 MG/ML SOLN COMPARISON:  CT scan of June 13, 2015. FINDINGS: Multiple sclerotic lesions are noted in the thoracic spine consistent with metastatic disease. No pneumothorax or pleural effusion is noted. Left lung is clear. Ill-defined multifocal airspace opacities are noted in the right middle and lower lobes most consistent with multifocal pneumonia; these were not present on prior exam. There is no evidence of thoracic aortic dissection or aneurysm. Material is noted in the lower lobe bronchi bilaterally, more prominent on the right, consistent with mucous plugging or aspiration. This also is new since prior exam. Visualized portion of upper abdomen is unremarkable. There is no definite evidence of pulmonary embolus. Right internal jugular Port-A-Cath is noted with distal tip at the cavoatrial junction. No mediastinal mass or adenopathy is noted. Review of the MIP images confirms the above findings. IMPRESSION: There is no evidence of pulmonary embolus. Multiple sclerotic lesions are noted in the thoracic spine consistent with osseous metastases. Multiple ill-defined airspace opacities are noted in the right middle and lower lobes consistent with multifocal pneumonia. There is also noted fluid or debris in the lower lobe bronchi bilaterally, but most prominently on the right. This is concerning for mucous plugging or  aspiration. Electronically Signed   By: Marijo Conception, M.D.   On: 07/12/2015 18:19    Chart has been reviewed  Family not  at  Bedside    Assessment/Plan  69 yo with hx of prostate cancer with metastasis to the bone persistent tachycardia presents with symptomatic anemia and found to have aspiration pneumonia  Present on Admission:  . Symptomatic anemia we'll admit to telemetry given persistent tachycardia will obtain anemia panel Hemoccult stool patient have had persistent anemia thought to be secondary to chemotherapy treatment was lately hemoglobin has dropped. We'll transfuse 2 units given how symptomatic patient is. Hold Plavix until blood loss has been ruled out as a source  . Protein-calorie malnutrition, severe obtain  nutritional consult obtain prealbumin  . Prostate cancer [facility]) - oncology is aware, pain management for metastatic disease  . HTN (hypertension) - will resume home medications of patient had not had blood pressure meds for the past 2 days  . Aspiration pneumonia ([place]) given patient being on chemotherapy will treat with broad-spectrum antibiotics as well as to cover aspiration pneumonia  . Tachycardia patient have had low-grade tachycardia: Dr. October,  will obtain TSH T4 and T3,  we'll transfuse given significant anemia. Obtain echogram,    rehydrate Diabetes mellitus - sliding scale  Prophylaxis: SCD  CODE STATUS:  FULL CODE  as per patient   Disposition: To home once workup is complete and patient is stable  Other plan as per orders.  I have spent a total of 65 min on this admission discussed withoncology  Vernica Wachtel 07/12/2015, 8:43 PM  [facility]  Pager [phone]   after 2 AM please page floor coverage PA If 7AM-7PM, please contact the day team taking care of the patient  [email or web address]  Password [email or web address]

## 2015-07-12 NOTE — ED Notes (Signed)
Patient refused lab draw for second set of blood cultures. Provider notified.

## 2015-07-12 NOTE — ED Provider Notes (Signed)
Medical screening examination/treatment/procedure(s) were conducted as a shared visit with non-physician practitioner(s) and myself.  I personally evaluated the patient during the encounter.   EKG Interpretation None     Patient here with 2 days of cough and congestion. Chest x-ray consistent with pneumonia. He is tachycardic here but denies being severely dyspneic. No anginal chest pain. Will obtain chest CT to rule out PE and started on antibiotic and likely admitted to the hospital  Lacretia Leigh, MD 07/12/15 1431

## 2015-07-12 NOTE — ED Provider Notes (Signed)
CSN: [id number]     Arrival date & time 07/12/15  1318 History   First MD Initiated Contact with Patient 07/12/15 1347     Chief Complaint  Patient presents with  . productive cough      (Consider location/radiation/quality/duration/timing/severity/associated sxs/prior Treatment) HPI   69 year old male with history of non-insulin-dependent diabetes, stroke, hypertension, prostate cancer who presents for evaluation of a cough. Patient reports for the past 2 days he has had decrease in appetite, and a cough productive with yellow sputum. Symptom has been persistence not improving. He was seen for bilateral leg pain related to his cancer several days prior and was given pain medication. He has not been taking his pain medication. He denies having fever, chills, headache, lightheadedness, dizziness, shortness of breath, or exertion, hemoptysis, nausea vomiting diarrhea, focal numbness or weakness. He is currently undergoing chemotherapy.  Past Medical History  Diagnosis Date  . Stroke ([place])   . Diabetes mellitus   . Hypertension   . Prostate cancer [facility])    Past Surgical History  Procedure Laterality Date  . Eye surgery     Family History  Problem Relation Age of Onset  . Cancer Mother   . Hypertension Mother   . Hypertension Father   . Cancer Father   . Diabetes Sister   . Hypertension Sister   . Hypertension Brother   . Diabetes Brother    Social History  Substance Use Topics  . Smoking status: Never Smoker   . Smokeless tobacco: None  . Alcohol Use: No    Review of Systems  All other systems reviewed and are negative.     Allergies  Review of patient's allergies indicates no known allergies.  Home Medications   Prior to Admission medications   Medication Sig Start Date End Date Taking? Authorizing Provider  aspirin 81 MG chewable tablet Chew 1 tablet (81 mg total) by mouth daily. 06/14/15  Yes Verlee Monte, MD  atorvastatin (LIPITOR) 40 MG tablet Take 1 tablet  (40 mg total) by mouth at bedtime. 06/14/15  Yes Verlee Monte, MD  cholecalciferol (VITAMIN D) 400 UNITS TABS tablet Take 400 Units by mouth daily.   Yes Historical Provider, MD  clopidogrel (PLAVIX) 75 MG tablet Take 1 tablet (75 mg total) by mouth daily with breakfast. 06/14/15  Yes Verlee Monte, MD  glipiZIDE (GLUCOTROL) 10 MG tablet Take 1 tablet (10 mg total) by mouth daily before breakfast. 06/14/15  Yes Verlee Monte, MD  lidocaine-prilocaine (EMLA) cream Apply 1 application topically as needed. 05/23/15  Yes Wyatt Portela, MD  losartan (COZAAR) 50 MG tablet Take 1 tablet (50 mg total) by mouth daily. 06/14/15  Yes Verlee Monte, MD  metFORMIN (GLUCOPHAGE) 1000 MG tablet Take 1 tablet (1,000 mg total) by mouth 2 (two) times daily. 06/14/15  Yes Verlee Monte, MD  oxyCODONE-acetaminophen (PERCOCET/ROXICET) 5-325 MG tablet Take 1 tablet by mouth every 6 (six) hours as needed for severe pain. 07/09/15  Yes Domenic Moras, PA-C  Pegfilgrastim (NEULASTA [place]) Inject 1 Dose into the skin.   Yes Historical Provider, MD  prochlorperazine (COMPAZINE) 10 MG tablet Take 1 tablet (10 mg total) by mouth every 6 (six) hours as needed for nausea or vomiting. 06/28/15  Yes Wyatt Portela, MD   BP 141/66 mmHg  Pulse 123  Temp(Src) 97.5 F (36.4 C) (Oral)  Resp 18  SpO2 98% Physical Exam  Constitutional: He is oriented to person, place, and time. He appears well-developed and well-nourished. No distress.  African-American male,  cachectic, in no acute respiratory distress.  HENT:  Head: Atraumatic.  Mouth/Throat: Oropharynx is clear and moist.  Eyes: Conjunctivae are normal.  Neck: Neck supple. No JVD present.  Cardiovascular:  Tachycardia without murmurs or gallops  Pulmonary/Chest:  Decreased lung sounds in right lower lung without wheezes, rales, rhonchi heard  Abdominal: Soft. There is no tenderness.  Musculoskeletal: He exhibits no edema.  Neurological: He is alert and oriented to person, place, and  time.  Skin: No rash noted.  Psychiatric: He has a normal mood and affect.  Nursing note and vitals reviewed.   ED Course  Procedures (including critical care time)  Patient history prostate cancer presents with productive cough for the past several days with decreased appetite. His chest x-ray demonstrated a right lower lobe infiltrate consistence with pneumonia. He denies any history chest pain or dyspnea on exertion. However, given history of cancer, chest CT angiogram obtained to rule out PE that may caused pulmonary infarct. Patient is also tachycardic, will obtain blood culture, and lactic acid.  3:36 PM Currently awaits labs and chest CTA.  abx started.  Care discussed with oncoming provider who will f/u on CT scan and labs.  Pt will need to be admitted for further management of his CAP, and tachycardia.  Rocephin/Zithromax IV abx will be given.  Pt is aware of plan.  Care discussed with Dr. Zenia Resides who agrees with plan.  EKG with sinus tach and abnormal T diffusely.  Pt does not have any active CP at this time.    Labs Review Labs Reviewed  CULTURE, BLOOD (ROUTINE X 2)  CULTURE, BLOOD (ROUTINE X 2)  CBC WITH DIFFERENTIAL/PLATELET  I-STAT CHEM 8, ED  I-STAT CG4 LACTIC ACID, ED  Randolm Idol, ED    Imaging Review Dg Chest 2 View  07/12/2015  CLINICAL DATA:  Two day history of productive cough. Hypertension. History prostate carcinoma. EXAM: CHEST  2 VIEW COMPARISON:  Chest radiograph June 12, 2015; chest CT June 13, 2015 FINDINGS: There is patchy infiltrate in the right lower lobe. The lungs elsewhere are clear. Heart size and pulmonary vascularity are normal. No adenopathy. Port-A-Cath tip is in the superior vena cava. No pneumothorax. Irregular sclerosis in the left humeral head region is stable. Sclerosis in the T7 vertebral body is likewise stable. IMPRESSION: Right lower lobe infiltrate consistent with pneumonia. Lungs elsewhere clear. Areas of sclerotic bony metastatic  disease. Electronically Signed   By: Lowella Grip III M.D.   On: 07/12/2015 14:20   I have personally reviewed and evaluated these images and lab results as part of my medical decision-making.   EKG Interpretation None     ED ECG REPORT   Date: 07/12/2015  Rate: 122  Rhythm: sinus tachycardia  QRS Axis: normal  Intervals: normal  ST/T Wave abnormalities: nonspecific T wave changes  Conduction Disutrbances:none  Narrative Interpretation:   Old EKG Reviewed: unchanged  I have personally reviewed the EKG tracing and agree with the computerized printout as noted.   MDM   Final diagnoses:  CAP (community acquired pneumonia)  Tachycardia    BP 164/65 mmHg  Pulse 122  Temp(Src) 97.5 F (36.4 C) (Oral)  Resp 20  SpO2 97%     Domenic Moras, PA-C 07/13/15 0727  Lacretia Leigh, MD 07/15/15 2329

## 2015-07-12 NOTE — ED Notes (Signed)
Pt request blood draw from port 

## 2015-07-12 NOTE — ED Notes (Signed)
Hospitalist at bedside 

## 2015-07-13 ENCOUNTER — Inpatient Hospital Stay (HOSPITAL_COMMUNITY): Payer: PPO

## 2015-07-13 DIAGNOSIS — E1140 Type 2 diabetes mellitus with diabetic neuropathy, unspecified: Secondary | ICD-10-CM

## 2015-07-13 DIAGNOSIS — D638 Anemia in other chronic diseases classified elsewhere: Secondary | ICD-10-CM | POA: Diagnosis present

## 2015-07-13 DIAGNOSIS — E871 Hypo-osmolality and hyponatremia: Secondary | ICD-10-CM

## 2015-07-13 DIAGNOSIS — IMO0002 Reserved for concepts with insufficient information to code with codable children: Secondary | ICD-10-CM | POA: Diagnosis present

## 2015-07-13 DIAGNOSIS — E876 Hypokalemia: Secondary | ICD-10-CM | POA: Diagnosis present

## 2015-07-13 DIAGNOSIS — D696 Thrombocytopenia, unspecified: Secondary | ICD-10-CM | POA: Diagnosis present

## 2015-07-13 DIAGNOSIS — E43 Unspecified severe protein-calorie malnutrition: Secondary | ICD-10-CM

## 2015-07-13 DIAGNOSIS — I10 Essential (primary) hypertension: Secondary | ICD-10-CM

## 2015-07-13 DIAGNOSIS — I5032 Chronic diastolic (congestive) heart failure: Secondary | ICD-10-CM | POA: Diagnosis present

## 2015-07-13 DIAGNOSIS — J9601 Acute respiratory failure with hypoxia: Secondary | ICD-10-CM

## 2015-07-13 DIAGNOSIS — A419 Sepsis, unspecified organism: Secondary | ICD-10-CM | POA: Diagnosis present

## 2015-07-13 DIAGNOSIS — E1165 Type 2 diabetes mellitus with hyperglycemia: Secondary | ICD-10-CM

## 2015-07-13 DIAGNOSIS — J189 Pneumonia, unspecified organism: Secondary | ICD-10-CM

## 2015-07-13 DIAGNOSIS — D649 Anemia, unspecified: Secondary | ICD-10-CM

## 2015-07-13 LAB — GLUCOSE, CAPILLARY
GLUCOSE-CAPILLARY: 210 mg/dL — AB (ref 65–99)
Glucose-Capillary: 251 mg/dL — ABNORMAL HIGH (ref 65–99)
Glucose-Capillary: 322 mg/dL — ABNORMAL HIGH (ref 65–99)
Glucose-Capillary: 183 mg/dL — ABNORMAL HIGH (ref 65–99)

## 2015-07-13 LAB — TROPONIN I
Troponin I: 0.03 ng/mL (ref <0.031)
Troponin I: 0.03 ng/mL (ref <0.031)

## 2015-07-13 LAB — FERRITIN: FERRITIN: 2068 ng/mL — AB (ref 24–336)

## 2015-07-13 LAB — COMPREHENSIVE METABOLIC PANEL
ALBUMIN: 3.2 g/dL — AB (ref 3.5–5.0)
ALT: 23 U/L (ref 17–63)
ANION GAP: 12 (ref 5–15)
AST: 34 U/L (ref 15–41)
Alkaline Phosphatase: 352 U/L — ABNORMAL HIGH (ref 38–126)
BILIRUBIN TOTAL: 0.8 mg/dL (ref 0.3–1.2)
BUN: 8 mg/dL (ref 6–20)
CO2: 22 mmol/L (ref 22–32)
Calcium: 9.4 mg/dL (ref 8.9–10.3)
Chloride: 99 mmol/L — ABNORMAL LOW (ref 101–111)
Creatinine, Ser: 0.48 mg/dL — ABNORMAL LOW (ref 0.61–1.24)
GFR calc Af Amer: >60 mL/min (ref >60)
GFR calc non Af Amer: >60 mL/min (ref >60)
GLUCOSE: 221 mg/dL — AB (ref 65–99)
POTASSIUM: 3.4 mmol/L — AB (ref 3.5–5.1)
SODIUM: 133 mmol/L — AB (ref 135–145)
TOTAL PROTEIN: 6.4 g/dL — AB (ref 6.5–8.1)

## 2015-07-13 LAB — IRON AND TIBC
IRON: 66 ug/dL (ref 45–182)
Saturation Ratios: 33 % (ref 17.9–39.5)
TIBC: 200 ug/dL — ABNORMAL LOW (ref 250–450)
UIBC: 134 ug/dL

## 2015-07-13 LAB — EXPECTORATED SPUTUM ASSESSMENT W REFEX TO RESP CULTURE

## 2015-07-13 LAB — EXPECTORATED SPUTUM ASSESSMENT W GRAM STAIN, RFLX TO RESP C

## 2015-07-13 LAB — PHOSPHORUS: Phosphorus: 2.4 mg/dL — ABNORMAL LOW (ref 2.5–4.6)

## 2015-07-13 LAB — FOLATE: Folate: 7.1 ng/mL (ref >5.9)

## 2015-07-13 LAB — TSH: TSH: 3.127 u[IU]/mL (ref 0.350–4.500)

## 2015-07-13 LAB — RETICULOCYTES
RBC.: 4.02 MIL/uL — AB (ref 4.22–5.81)
RETIC COUNT ABSOLUTE: 56.3 10*3/uL (ref 19.0–186.0)
RETIC CT PCT: 1.4 % (ref 0.4–3.1)

## 2015-07-13 LAB — T4, FREE: Free T4: 1.12 ng/dL (ref 0.61–1.12)

## 2015-07-13 LAB — VITAMIN B12: Vitamin B-12: 3927 pg/mL — ABNORMAL HIGH (ref 180–914)

## 2015-07-13 LAB — STREP PNEUMONIAE URINARY ANTIGEN: Strep Pneumo Urinary Antigen: NEGATIVE

## 2015-07-13 LAB — MAGNESIUM: Magnesium: 1.8 mg/dL (ref 1.7–2.4)

## 2015-07-13 LAB — PREALBUMIN: PREALBUMIN: 8.4 mg/dL — AB (ref 18–38)

## 2015-07-13 MED ORDER — DOCUSATE SODIUM 100 MG PO CAPS
100.0000 mg | ORAL_CAPSULE | Freq: Every day | ORAL | Status: DC
  Administered 2015-07-13 – 2015-07-15 (×3): 100 mg via ORAL
  Filled 2015-07-13 (×3): qty 1

## 2015-07-13 MED ORDER — INSULIN GLARGINE 100 UNIT/ML ~~LOC~~ SOLN
15.0000 [IU] | Freq: Every day | SUBCUTANEOUS | Status: DC
  Administered 2015-07-13: 15 [IU] via SUBCUTANEOUS
  Filled 2015-07-13: qty 0.15

## 2015-07-13 MED ORDER — POTASSIUM CHLORIDE CRYS ER 20 MEQ PO TBCR
40.0000 meq | EXTENDED_RELEASE_TABLET | Freq: Once | ORAL | Status: AC
  Administered 2015-07-13: 40 meq via ORAL
  Filled 2015-07-13: qty 2

## 2015-07-13 NOTE — Evaluation (Signed)
Clinical/Bedside Swallow Evaluation Patient Details  Name: Ricky Terry MRN: [id number] Date of Birth: Jan 16, 1946  Today's Date: 07/13/2015 Time: SLP Start Time (ACUTE ONLY): 66 SLP Stop Time (ACUTE ONLY): 1039 SLP Time Calculation (min) (ACUTE ONLY): 19 min  Past Medical History:  Past Medical History  Diagnosis Date  . Stroke ([place])   . Diabetes mellitus   . Hypertension   . Prostate cancer [facility])    Past Surgical History:  Past Surgical History  Procedure Laterality Date  . Eye surgery     HPI:  69 yo male adm to [facility] with cough/shortness of breath.  PMH + for prostate cancer with mets, stroke, HTN and DM. Pt imaging studies concerning for right pna- concerning for aspiration.      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  No s/s of aspiration with all po observed including sherbert, graham cracker and gingerale.   Swallow was timely with clear voice throughout.  Pt denies dysphgia symptoms.  SLP educted pt to aspiration precautions and mitigation strategies when/if indicated.  SLP to sign off as all education completed.     Aspiration Risk  Mild aspiration risk    Diet Recommendation Thin liquid;Regular   Liquid Administration via: Cup;Straw Medication Administration: Whole meds with liquid Supervision: Patient able to self feed Compensations: Slow rate;Small sips/bites Postural Changes: Seated upright at 90 degrees;Remain upright for at least 30 minutes after po intake    Other  Recommendations Oral Care Recommendations: Oral care BID   Follow up Recommendations  None    Frequency and Duration min 1 x/week  1 week       Prognosis Prognosis for Safe Diet Advancement: Guarded      Swallow Study   General Date of Onset: 07/13/15 HPI: 69 yo male adm to [facility] with cough/shortness of breath.  PMH + for prostate cancer with mets, stroke, HTN  and DM. Pt imaging studies concerning for right pna- concerning for aspiration.    Type of Study: Bedside Swallow Evaluation Diet Prior to this Study: Regular;Thin liquids Temperature Spikes Noted: No Respiratory Status: Room air History of Recent Intubation: No Behavior/Cognition: Alert;Cooperative;Pleasant mood Oral Cavity Assessment: Within Functional Limits Oral Care Completed by SLP: No Oral Cavity - Dentition: Dentures, bottom;Dentures, top Vision: Functional for self-feeding Self-Feeding Abilities: Able to feed self Patient Positioning: Upright in bed Baseline Vocal Quality: Normal Volitional Cough: Strong Volitional Swallow: Able to elicit    Oral/Motor/Sensory Function Overall Oral Motor/Sensory Function: Within functional limits   Ice Chips Ice chips: Not tested   Thin Liquid Thin Liquid: Within functional limits Presentation: Self Fed;Straw;Cup    Nectar Thick Nectar Thick Liquid: Not tested   Honey Thick Honey Thick Liquid: Not tested   Puree Puree: Within functional limits Presentation: Spoon;Self Fed   Solid Solid: Within functional limits Presentation: [place], [place] [facility] SLP [phone]

## 2015-07-13 NOTE — Progress Notes (Addendum)
Patient ID: Ricky Terry, male   DOB: 10-Apr-1946, 69 y.o.   MRN: [id number]  [facility] PROGRESS NOTE  Ricky Terry [id number] DOB: 10/17/1945 DOA: 07/12/2015 PCP: Ricky Mccreedy, MD   Brief narrative:    69 y.o. 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 Pt presented to [facility] ED with main concern of several days duration of progressive dyspnea mostly with exertion but occasionally present at rest, associated with productive cough of greenish sputum, fevers, chills, malaise.  In ED, CTA of the chest notable for multiple sclerotic lesion thoracic spine and multiple airspace opacities in the right middle lower lobes consistent multifocal pneumonia. [facility] asked admit pt for further evaluation.   Assessment/Plan:    Principal Problem:   Acute respiratory failure with hypoxia (HCC) - secondary to right middle and lower lobe PNA and ? Aspiration PNA - keep on broad spectrum ABX as pt still with leukocytosis and sputum and blood cultures are note back - narrow down ABX as clinically indicated - follow up on blood cultures   Active Problems:   Protein-calorie malnutrition, severe - nutritionist consulted - liberalize diet as pt able to tolerate    Sepsis due to mulitfocal pneumonia ([place]) - please note that pt met criteria for sepsis on admission with T 100.2 F, HR > 90, RR > 22, WBC 30 K - on vancomycin and zosyn and will continue same regimen for now - pt is clinically stable this AM, appears to be responding to current regimen - follow up on blood and sputum cultures     Tachycardia - this is reactive and secondary to sepsis and PNA outlined above - treat underlying problem    Accelerated hypertension - currently on losartan and will continue same regimen  - add hydralazine as needed for better BP  control - would not add scheduled antihypertensive regimen as I suspect that BO will stabilize once sepsis is resolved     DM type 2, uncontrolled, with neuropathy ([place]) - on metformin and glipizide at home - will stop both medications for now  - A1C 10/25 was > 14, will add Lantus 15 U and will continue SSI - diabetic educator consulted and we certainly appreciate recommendations     Thrombocytopenia ([place]) - no signs of bleeding - CBC in AM    Anemia of malignancy - anemia panel pending  - pt transfused two units of PRBC and no change in Hg - stop Plavix for now - transfuse for Hg < 7    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  Family Communication:  plan of care discussed with the patient Disposition Plan: Home when sepsis resolved, blood and sputum cultures are back   IV access:  Peripheral IV  Procedures and diagnostic studies:  Dg Chest 2 View 07/12/2015 Right lower lobe infiltrate consistent with pneumonia. Lungs elsewhere clear. Areas of sclerotic bony metastatic disease.   Ct Angio Chest Pe W/cm &/or Wo Cm 07/12/2015 There is no evidence of pulmonary embolus. Multiple sclerotic lesions are noted in the thoracic spine consistent with osseous metastases. Multiple ill-defined airspace opacities are noted in the right middle and lower lobes consistent with multifocal pneumonia. There is also noted fluid or debris in the lower lobe bronchi bilaterally, but most prominently on  the right. This is concerning for mucous plugging or aspiration.   Medical Consultants:  None  Other Consultants:  PT Diabetic educator   IAnti-Infectives:   Vancomycin and Zosyn 11/23 -->  Ricky Ramsay, MD  [facility] Pager [phone]  If 7PM-7AM, please contact night-coverage [email or web address] Password [facility] 07/13/2015, 12:42 PM   LOS: 1 day   HPI/Subjective: No events overnight.   Objective: Filed Vitals:   07/13/15 0405 07/13/15 0407 07/13/15 0413 07/13/15 0630  BP: 128/60   163/68  Pulse: 109   112  Temp: 98 F (36.7 C)   98.6 F (37 C)  TempSrc: Oral   Oral  Resp: 20   20  Height:      Weight:      SpO2: 91% 90% 69% 97%    Intake/Output Summary (Last 24 hours) at 07/13/15 1242 Last data filed at 07/13/15 0958  Gross per 24 hour  Intake 1740.75 ml  Output    600 ml  Net 1140.75 ml    Exam:   General:  Pt is alert, follows commands appropriately, not in acute distress  Cardiovascular: Regular rhythm, tachycardic, S1/S2, no murmurs, no rubs, no gallops  Respiratory: Clear to auscultation bilaterally, no wheezing, rhonchi at bases and worse on the right side   Abdomen: Soft, non tender, non distended, bowel sounds present, no guarding  Extremities: bilateral thigh edema   Data Reviewed: Basic Metabolic Panel:  Recent Labs Lab 07/09/15 2008 07/11/15 1110 07/12/15 1600 07/13/15 1000  NA 133* 133* 133* 133*  K 4.2 4.0 3.9 3.4*  CL 99* 99* 97* 99*  CO2 _0 GLUCOSE 422* 305* 343* 221*  BUN _1 CREATININE 0.55* 0.44* 0.51* 0.48*  CALCIUM 9.0 9.1 9.0 9.4   Liver Function Tests:  Recent Labs Lab 07/09/15 2008 07/11/15 1110 07/13/15 1000  AST 20 27 34  ALT 15* 19 23  ALKPHOS 446* 432* 352*  BILITOT 0.5 0.7 0.8  PROT 6.3* 6.3* 6.4*  ALBUMIN 3.3* 3.3* 3.2*   CBC:  Recent Labs Lab 07/09/15 2008 07/11/15 1110 07/12/15 1600  WBC 32.1* 27.0* 30.8*  NEUTROABS 28.9* 24.5* 29.0*  HGB 7.4* 7.6* 7.3*  HCT 22.7* 23.3* 22.0*   MCV 81.1 82.3 81.5  PLT 108* 85* 121*   Cardiac Enzymes:  Recent Labs Lab 07/09/15 2008 07/12/15 2310 07/13/15 0959  CKTOTAL 558*  --   --   TROPONINI  --  0.03 0.03   CBG:  Recent Labs Lab 07/09/15 2244 07/12/15 2306 07/13/15 0723 07/13/15 1139  GLUCAP 276* 307* 251* 322*   Recent Results (from the past 240 hour(s))  Culture, blood (routine x 2)     Status: None (Preliminary result)   Collection Time: 07/12/15  4:00 PM  Result Value Ref Range Status   Specimen Description BLOOD RIGHT CHEST  Final   Special Requests BOTTLES DRAWN AEROBIC AND ANAEROBIC 5CC EACH  Final   Culture   Final    NO GROWTH <  67 HOURS Performed at [facility]    Report Status PENDING  Incomplete  Culture, blood (routine x 2)     Status: None (Preliminary result)   Collection Time: 07/12/15 11:10 PM  Result Value Ref Range Status   Specimen Description BLOOD LEFT ANTECUBITAL  Final   Special Requests   Final    BOTTLES DRAWN AEROBIC ONLY 6 ML Performed at [facility]    Culture PENDING  Incomplete   Report Status PENDING  Incomplete  Culture, sputum-assessment     Status: None   Collection Time: 07/13/15  8:14 AM  Result Value Ref Range Status   Specimen Description SPUTUM  Final   Special Requests NONE  Final   Sputum evaluation   Final    THIS SPECIMEN IS ACCEPTABLE. RESPIRATORY CULTURE REPORT TO FOLLOW.   Report Status 07/13/2015 FINAL  Final     Scheduled Meds: . aspirin  81 mg Oral Daily  . atorvastatin  40 mg Oral QHS  . clopidogrel  75 mg Oral Q breakfast  . glipiZIDE  10 mg Oral QAC breakfast  . insulin aspart  0-5 Units Subcutaneous QHS  . insulin aspart  0-9 Units Subcutaneous TID WC  . losartan  50 mg Oral Daily  . ZOSYN  IV  3.375 g Intravenous 3 times per day  . vancomycin  750 mg Intravenous BID   Continuous Infusions: . sodium chloride 75 mL/hr at 07/12/15 2323

## 2015-07-13 NOTE — Plan of Care (Signed)
Problem: Safety: Goal: Ability to remain free from injury will improve Outcome: Progressing Discussed safety measures, Encouraged patient to use call bell for needs such as toileting, ambulation.

## 2015-07-14 ENCOUNTER — Inpatient Hospital Stay (HOSPITAL_COMMUNITY): Payer: PPO

## 2015-07-14 DIAGNOSIS — J690 Pneumonitis due to inhalation of food and vomit: Secondary | ICD-10-CM

## 2015-07-14 DIAGNOSIS — C7951 Secondary malignant neoplasm of bone: Secondary | ICD-10-CM

## 2015-07-14 DIAGNOSIS — C61 Malignant neoplasm of prostate: Secondary | ICD-10-CM

## 2015-07-14 DIAGNOSIS — G893 Neoplasm related pain (acute) (chronic): Secondary | ICD-10-CM

## 2015-07-14 DIAGNOSIS — R52 Pain, unspecified: Secondary | ICD-10-CM

## 2015-07-14 DIAGNOSIS — J9690 Respiratory failure, unspecified, unspecified whether with hypoxia or hypercapnia: Secondary | ICD-10-CM

## 2015-07-14 LAB — TYPE AND SCREEN
ABO/RH(D): A POS
Antibody Screen: NEGATIVE
Unit division: 0
Unit division: 0

## 2015-07-14 LAB — CBC
HEMATOCRIT: 29.4 % — AB (ref 39.0–52.0)
Hemoglobin: 10.2 g/dL — ABNORMAL LOW (ref 13.0–17.0)
MCH: 27.6 pg (ref 26.0–34.0)
MCHC: 34.7 g/dL (ref 30.0–36.0)
MCV: 79.7 fL (ref 78.0–100.0)
PLATELETS: 242 10*3/uL (ref 150–400)
RBC: 3.69 MIL/uL — ABNORMAL LOW (ref 4.22–5.81)
RDW: 15.9 % — AB (ref 11.5–15.5)
WBC: 23 10*3/uL — ABNORMAL HIGH (ref 4.0–10.5)

## 2015-07-14 LAB — LEGIONELLA PNEUMOPHILA SEROGP 1 UR AG: L. pneumophila Serogp 1 Ur Ag: NEGATIVE

## 2015-07-14 LAB — BASIC METABOLIC PANEL
Anion gap: 8 (ref 5–15)
BUN: 6 mg/dL (ref 6–20)
CALCIUM: 9.3 mg/dL (ref 8.9–10.3)
CO2: 27 mmol/L (ref 22–32)
CREATININE: 0.35 mg/dL — AB (ref 0.61–1.24)
Chloride: 99 mmol/L — ABNORMAL LOW (ref 101–111)
GFR calc Af Amer: >60 mL/min (ref >60)
GFR calc non Af Amer: >60 mL/min (ref >60)
GLUCOSE: 143 mg/dL — AB (ref 65–99)
Potassium: 3.7 mmol/L (ref 3.5–5.1)
Sodium: 134 mmol/L — ABNORMAL LOW (ref 135–145)

## 2015-07-14 LAB — GLUCOSE, CAPILLARY
Glucose-Capillary: 137 mg/dL — ABNORMAL HIGH (ref 65–99)
Glucose-Capillary: 259 mg/dL — ABNORMAL HIGH (ref 65–99)
Glucose-Capillary: 302 mg/dL — ABNORMAL HIGH (ref 65–99)

## 2015-07-14 MED ORDER — ENSURE ENLIVE PO LIQD
237.0000 mL | Freq: Two times a day (BID) | ORAL | Status: DC
  Administered 2015-07-14 – 2015-07-18 (×4): 237 mL via ORAL

## 2015-07-14 MED ORDER — HYDROMORPHONE HCL 1 MG/ML IJ SOLN
1.0000 mg | INTRAMUSCULAR | Status: DC | PRN
  Administered 2015-07-14 – 2015-07-17 (×8): 1 mg via INTRAVENOUS
  Filled 2015-07-14 (×9): qty 1

## 2015-07-14 MED ORDER — INSULIN GLARGINE 100 UNIT/ML ~~LOC~~ SOLN
5.0000 [IU] | Freq: Every day | SUBCUTANEOUS | Status: DC
  Administered 2015-07-14 – 2015-07-18 (×5): 5 [IU] via SUBCUTANEOUS
  Filled 2015-07-14 (×6): qty 0.05

## 2015-07-14 MED ORDER — POLYETHYLENE GLYCOL 3350 17 G PO PACK
17.0000 g | PACK | Freq: Two times a day (BID) | ORAL | Status: DC
  Administered 2015-07-14 – 2015-07-19 (×9): 17 g via ORAL
  Filled 2015-07-14 (×9): qty 1

## 2015-07-14 NOTE — Evaluation (Signed)
Physical Therapy Evaluation Patient Details Name: Ricky Terry MRN: [id number] DOB: 03/27/1946 Today's Date: 07/14/2015   History of Present Illness  69 y.o. 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  Clinical Impression  Pt admitted with above diagnosis. Pt currently with functional limitations due to the deficits listed below (see PT Problem List).   Pt will benefit from skilled PT to increase their independence and safety with mobility to allow discharge to the venue listed below.   Pt reports LE stiffness and pain are limiting his mobility today.  Pt lives alone however states he could have someone assist him at home.  If assist not available at home would recommend ST-SNF      Follow Up Recommendations Home health PT;Supervision/Assistance - 24 hour    Equipment Recommendations  Rolling walker with 5" wheels;3in1 (PT)    Recommendations for Other Services       Precautions / Restrictions Precautions Precautions: Fall      Mobility  Bed Mobility Overal bed mobility: Needs Assistance Bed Mobility: Supine to Sit     Supine to sit: Mod assist     General bed mobility comments: encouraged log roll technique however pt felt unable to tolerate due to LE pain so assisted LEs over EOB, pt able to assist his trunk  Transfers Overall transfer level: Needs assistance Equipment used: Rolling walker (2 wheeled) Transfers: Sit to/from Stand Sit to Stand: Min assist         General transfer comment: verbal cues for safe technique, assist to rise and steady  Ambulation/Gait Ambulation/Gait assistance: Min assist Ambulation Distance (Feet): 200 Feet Assistive device: Rolling walker (2 wheeled) Gait Pattern/deviations: Step-through pattern;Decreased stride length     General Gait Details: initially  requiring assist for steadying, increased reliance on UEs as well however his improved with distance, SPO2 99-100% on room air, HR 125 bpm during gait  Stairs            Wheelchair Mobility    Modified Rankin (Stroke Patients Only)       Balance Overall balance assessment: Needs assistance         Standing balance support: Bilateral upper extremity supported Standing balance-Leahy Scale: Poor Standing balance comment: requires UE support                             Pertinent Vitals/Pain Pain Assessment: 0-10 Pain Score:  (did not rate) Pain Location: bil LEs - reports at his baseline pain level Pain Descriptors / Indicators: Aching Pain Intervention(s): Limited activity within patient's tolerance;Monitored during session;Repositioned    Home Living Family/patient expects to be discharged to:: Private residence Living Arrangements: Alone Available Help at Discharge: Family;Friend(s);Available PRN/intermittently Type of Home: Apartment Home Access: Level entry     Home Layout: One level Home Equipment: None      Prior Function Level of Independence: Independent               Hand Dominance        Extremity/Trunk Assessment               Lower Extremity Assessment: Generalized weakness;RLE deficits/detail;LLE deficits/detail RLE Deficits / Details: able to move against gravity however slowly, reports stiffness is his biggest issue LLE Deficits / Details: able to move against gravity however slowly, reports stiffness is  his biggest issue     Communication   Communication: No difficulties  Cognition Arousal/Alertness: Awake/alert Behavior During Therapy: WFL for tasks assessed/performed Overall Cognitive Status: Within Functional Limits for tasks assessed                      General Comments      Exercises        Assessment/Plan    PT Assessment Patient needs continued PT services  PT Diagnosis Difficulty  walking   PT Problem List Decreased strength;Decreased activity tolerance;Decreased mobility;Decreased balance;Decreased knowledge of use of DME  PT Treatment Interventions DME instruction;Gait training;Functional mobility training;Patient/family education;Therapeutic activities;Therapeutic exercise   PT Goals (Current goals can be found in the Care Plan section) Acute Rehab PT Goals PT Goal Formulation: With patient Time For Goal Achievement: 07/21/15 Potential to Achieve Goals: Good    Frequency Min 3X/week   Barriers to discharge        Co-evaluation               End of Session Equipment Utilized During Treatment: Gait belt Activity Tolerance: Patient tolerated treatment well Patient left: in chair;with call bell/phone within reach;with chair alarm set Nurse Communication: Mobility status (SpO2 good on room air, left off O2 [place])         Time: [id number] PT Time Calculation (min) (ACUTE ONLY): 19 min   Charges:   PT Evaluation $Initial PT Evaluation Tier I: 1 Procedure     PT G Codes:        Gabrille Kilbride,KATHrine E 07/14/2015, 1:31 PM Carmelia Bake, PT, DPT 07/14/2015 Pager: [phone]

## 2015-07-14 NOTE — Progress Notes (Signed)
IP PROGRESS NOTE  Subjective:   Patient known to me with history of prostate cancer and admitted on 07/12/2015 with symptoms of shortness of breath and respiratory failure. He was found to have possible aspiration pneumonia. Clinically, he appears to be feeling slightly better but does complain of bilateral lower extremity pain. Has not reported any fevers or chills. His respiratory status appears stable.  Objective:  Vital signs in last 24 hours: Temp:  [97.9 F (36.6 C)-98.5 F (36.9 C)] 98.1 F (36.7 C) (11/25 0430) Pulse Rate:  [113-123] 113 (11/25 0430) Resp:  [20] 20 (11/25 0430) BP: (155-181)/(69-84) 155/72 mmHg (11/25 0430) SpO2:  [95 %-100 %] 100 % (11/25 0430) Weight:  [126 lb 9.6 oz (57.425 kg)] 126 lb 9.6 oz (57.425 kg) (11/25 0430) Weight change: 11.5 oz (0.325 kg) Last BM Date: 07/08/15  Intake/Output from previous day: 11/24 0701 - 11/25 0700 In: 740 [P.O.:480; I.V.:10; IV Piggyback:250] Out: 2375 [Urine:2375] Chronically ill-appearing gentleman without distress. Mouth: mucous membranes moist, pharynx normal without lesions Resp: clear to auscultation bilaterally Cardio: regular rate and rhythm, S1, S2 normal, no murmur, click, rub or gallop GI: soft, non-tender; bowel sounds normal; no masses,  no organomegaly Extremities: extremities normal, atraumatic, no cyanosis or edema  Portacath/PICC-without erythema  Lab Results:  Recent Labs  07/12/15 1600 07/14/15 0435  WBC 30.8* 23.0*  HGB 7.3* 10.2*  HCT 22.0* 29.4*  PLT 121* 242    BMET  Recent Labs  07/12/15 1600 07/13/15 1000  NA 133* 133*  K 3.9 3.4*  CL 97* 99*  CO2 24 22  GLUCOSE 343* 221*  BUN 10 8  CREATININE 0.51* 0.48*  CALCIUM 9.0 9.4    Studies/Results: Dg Chest 2 View  07/12/2015  CLINICAL DATA:  Two day history of productive cough. Hypertension. History prostate carcinoma. EXAM: CHEST  2 VIEW COMPARISON:  Chest radiograph June 12, 2015; chest CT June 13, 2015 FINDINGS:  There is patchy infiltrate in the right lower lobe. The lungs elsewhere are clear. Heart size and pulmonary vascularity are normal. No adenopathy. Port-A-Cath tip is in the superior vena cava. No pneumothorax. Irregular sclerosis in the left humeral head region is stable. Sclerosis in the T7 vertebral body is likewise stable. IMPRESSION: Right lower lobe infiltrate consistent with pneumonia. Lungs elsewhere clear. Areas of sclerotic bony metastatic disease. Electronically Signed   By: Lowella Grip III M.D.   On: 07/12/2015 14:20   Ct Angio Chest Pe W/cm &/or Wo Cm  07/12/2015  CLINICAL DATA:  Cough, congestion. Tachycardia. Current history of metastatic prostate cancer. EXAM: CT ANGIOGRAPHY CHEST WITH CONTRAST TECHNIQUE: Multidetector CT imaging of the chest was performed using the standard protocol during bolus administration of intravenous contrast. Multiplanar CT image reconstructions and MIPs were obtained to evaluate the vascular anatomy. CONTRAST:  15mL OMNIPAQUE IOHEXOL 350 MG/ML SOLN COMPARISON:  CT scan of June 13, 2015. FINDINGS: Multiple sclerotic lesions are noted in the thoracic spine consistent with metastatic disease. No pneumothorax or pleural effusion is noted. Left lung is clear. Ill-defined multifocal airspace opacities are noted in the right middle and lower lobes most consistent with multifocal pneumonia; these were not present on prior exam. There is no evidence of thoracic aortic dissection or aneurysm. Material is noted in the lower lobe bronchi bilaterally, more prominent on the right, consistent with mucous plugging or aspiration. This also is new since prior exam. Visualized portion of upper abdomen is unremarkable. There is no definite evidence of pulmonary embolus. Right internal jugular Port-A-Cath is  noted with distal tip at the cavoatrial junction. No mediastinal mass or adenopathy is noted. Review of the MIP images confirms the above findings. IMPRESSION: There is no  evidence of pulmonary embolus. Multiple sclerotic lesions are noted in the thoracic spine consistent with osseous metastases. Multiple ill-defined airspace opacities are noted in the right middle and lower lobes consistent with multifocal pneumonia. There is also noted fluid or debris in the lower lobe bronchi bilaterally, but most prominently on the right. This is concerning for mucous plugging or aspiration. Electronically Signed   By: Marijo Conception, M.D.   On: 07/12/2015 18:19    Medications: I have reviewed the patient's current medications.  Assessment/Plan:  69 year old gentleman with the following issues:  1. Respiratory failure likely related to pneumonia possible aspiration. Currently on broad-spectrum antibiotics and appears to be clinically stable. His leukocytosis appears to be improving. I agree with the current management.  2. Prostate cancer: Currently on systemic chemotherapy in the form of Taxotere. His next treatment is 07/19/2015 and depending on his clinical status will determine whether he will receive that at that time.  3. Lower extremity pain: Likely related to his prostate cancer metastasis to the bone. He has been referred to radiation oncology for palliative radiation therapy but have not started radiation at this time and has been hospitalized since. Back and commence as an outpatient on Monday if he is still hospitalized. In the meantime, I agree with pain pain control utilizing narcotic analgesia.  4. Disposition: I have no objections to discharge was clinically stable with follow-up already set up on 07/19/2015.  I appreciate the care of the Hospitalist team and please call with any questions.   LOS: 2 days   [facility] 07/14/2015, 7:38 AM

## 2015-07-14 NOTE — Progress Notes (Signed)
Initial Nutrition Assessment  DOCUMENTATION CODES:   Severe malnutrition in context of chronic illness  INTERVENTION:   -Provide Ensure Enlive po BID, each supplement provides 350 kcal and 20 grams of protein -Place patient on meals with assist to ensure meal ordering and consistent intake -Encourage PO intake -Placed lunch order for today -RD to continue to monitor  NUTRITION DIAGNOSIS:   Malnutrition related to chronic illness, cancer and cancer related treatments as evidenced by percent weight loss, moderate depletion of body fat, severe depletion of muscle mass.  GOAL:   Patient will meet greater than or equal to 90% of their needs  MONITOR:   PO intake, Supplement acceptance, Labs, Weight trends, Skin, I & O's  REASON FOR ASSESSMENT:   Consult Assessment of nutrition requirement/status  ASSESSMENT:   69 y.o male patient is known history of metastatic prostate cancer with spread to the bone involvement of femur and thoracic spine treated with chemotherapy last treatment on 11. November complicated by symptomatic anemia requiring blood transfusion in October.  Patient in room with no family present. Pt states his appetite has improved slightly. Currently eating 50-100% of his meals. Pt did say he hasn't eaten breakfast this morning because "didn't think about it". Ordered patient's lunch tray for him. Will place patient on meal order with assist to ensure he orders meals. Encouraged consistent intake to help his blood sugars. CBGs elevated.  Patient was admitted for aspiration pneumonia. SLP evaluated and recommended regular diet with thin liquids.  Pt states he drinks Ensures at home. RD to order. If his CBGs do not improve, may switch to Glucerna shakes.  Per weight history, pt has lost 29 lb since 8/22 (19% weight loss x 3 months, significant for time frame).  Nutrition-Focused physical exam completed. Findings are moderate fat depletion, severe muscle depletion, and  no edema.   Labs reviewed: CBGs: 137-259 Low Na, Creatinine  Diet Order:  Diet Carb Modified Fluid consistency:: Thin; Room service appropriate?: Yes  Skin:  Reviewed, no issues  Last BM:  11/19  Height:   Ht Readings from Last 1 Encounters:  07/12/15 5\' 6"  (1.676 m)    Weight:   Wt Readings from Last 1 Encounters:  07/14/15 126 lb 9.6 oz (57.425 kg)    Ideal Body Weight:  64.5 kg  BMI:  Body mass index is 20.44 kg/(m^2).  Estimated Nutritional Needs:   Kcal:  [id number]  Protein:  90-100g  Fluid:  1.9L/day  EDUCATION NEEDS:   No education needs identified at this time  Ricky Bibles, MS, RD, LDN Pager: [phone] After Hours Pager: [phone]

## 2015-07-14 NOTE — Progress Notes (Signed)
VASCULAR LAB PRELIMINARY  PRELIMINARY  PRELIMINARY  PRELIMINARY   Bilateral lower extremity venous duplex completed.    Preliminary report:  Bilateral:  No evidence of DVT, superficial thrombosis, or Baker's Cyst.   Edra Riccardi, RVS 07/14/2015, 9:54 AM

## 2015-07-14 NOTE — Progress Notes (Addendum)
Inpatient Diabetes Program Recommendations  AACE/ADA: New Consensus Statement on Inpatient Glycemic Control (2015)  Target Ranges:  Prepandial:   less than 140 mg/dL      Peak postprandial:   less than 180 mg/dL (1-2 hours)      Critically ill patients:  140 - 180 mg/dL   Review of Glycemic Control  Diabetes history: DM 2 Outpatient Diabetes medications: Glipizide 10 mg daily and Metformin Current orders for Inpatient glycemic control: Lantus 5 units and sensitive correction tidwc and HS correction  Inpatient Diabetes Program Recommendations:    Consult received for DM uncontrolled. Reviewed labs and noted history of extremely low Hgb levels over the past 2 months. This typically has a tremendous influence on the HgbA1C level causing a higher false result. Patient has had cancer with steroid therapy as well which also influences the high glucose levels during that time. Pt admitted with hyperglycemia, but is controlled well now on present regimen. Patient has been assessed  with prior hospitalizations with  previous visits from the Diabetes Coordinator noting again that the anemia most probably sways the HgbA1c levels as a false high.  Would not recommend a change in therapy at this point without follow-up as OP under his PCP. Agree with IP orders at this time using basal lantus while here.  Thank you Rosita Kea, RN, MSN, CDE  Diabetes Inpatient Program Office: [phone] Pager: [phone] 8:00 am to 5:00 pm

## 2015-07-14 NOTE — Progress Notes (Signed)
Patient ID: Ricky Terry, male   DOB: Jan 10, 1946, 69 y.o.   MRN: [id number]  [facility] PROGRESS NOTE  Ricky Terry YOM:[id number] DOB: 11/23/1945 DOA: 07/12/2015 PCP: [name], MD   Brief narrative:    69 y.o. 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 Pt presented to [facility] ED with main concern of several days duration of progressive dyspnea mostly with exertion but occasionally present at rest, associated with productive cough of greenish sputum, fevers, chills, malaise.  In ED, CTA of the chest notable for multiple sclerotic lesion thoracic spine and multiple airspace opacities in the right middle lower lobes consistent multifocal pneumonia. [facility] asked admit pt for further evaluation.   Assessment/Plan:    Principal Problem:   Acute respiratory failure with hypoxia (HCC) - secondary to right middle and lower lobe PNA and ? Aspiration PNA - keep on broad spectrum ABX as sputum and blood cultures are not back - narrow down ABX as clinically indicated - follow up on blood cultures   Active Problems:   Protein-calorie malnutrition, severe - nutritionist consulted - liberalize diet as pt able to tolerate    Sepsis due to mulitfocal pneumonia ([place]) - please note that pt met criteria for sepsis on admission with T 100.2 F, HR > 90, RR > 22, WBC 30 K - on vancomycin and zosyn and will continue same regimen for now - pt is clinically stable this AM, appears to be responding to current regimen - plan to change ABX to PO in am - follow up on blood and sputum cultures     Tachycardia - this is reactive and secondary to sepsis and PNA outlined above - treat underlying problem    Accelerated hypertension - currently on losartan and will continue same regimen  - added hydralazine as needed for better BP  control - would not add scheduled antihypertensive regimen as I suspect that BO will stabilize once sepsis is resolved     DM type 2, uncontrolled, with neuropathy ([place]) - on metformin and glipizide at home - continue holding both medications for now  - A1C 10/25 was > 14, added Lantus 5 U and will continue SSI - diabetic educator consulted and we certainly appreciate recommendations     Thrombocytopenia ([place]) - no signs of bleeding, resolved  - CBC in AM    Anemia of malignancy - anemia panel pending  - pt transfused two units of PRBC, Hg up to 10.2 this AM - continue to hold Plavix for now    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 Hazeline Junker assistance  - pt was on neulasta and steroids     Hyponatremia - appears to be pre renal - Na up to 134 this AM    Hypokalemia - supplemented and WNL    Chronic diastolic CHF (congestive heart failure), NYHA class 1 ([place]) - last 2 D ECHO done in Oct 2016, no need for repeat 2 D ECHO - grade I diastolic CHF noted on last 2 D ECHO - monitor daily weights, strict I/O  DVT prophylaxis - SCD's  Code Status: Full.  Family Communication:  plan of care discussed with the patient Disposition Plan: Home when sepsis resolved, blood and sputum cultures are back   IV access:  Peripheral IV  Procedures and diagnostic studies:    Dg Chest 2  View 07/12/2015 Right lower lobe infiltrate consistent with pneumonia. Lungs elsewhere clear. Areas of sclerotic bony metastatic disease.   Ct Angio Chest Pe W/cm &/or Wo Cm 07/12/2015 There is no evidence of pulmonary embolus. Multiple sclerotic lesions are noted in the thoracic spine consistent with osseous metastases. Multiple ill-defined airspace opacities are noted in the right middle and lower lobes consistent with multifocal pneumonia. There is also noted fluid or debris in the lower lobe bronchi bilaterally, but most prominently on the right. This is concerning  for mucous plugging or aspiration.   Medical Consultants:  Oncology   Other Consultants:  PT Diabetic educator   IAnti-Infectives:   Vancomycin and Zosyn 11/23 -->  [name], MD  [facility] Pager [phone]  If 7PM-7AM, please contact night-coverage [email or web address] Password [email or web address] 07/14/2015, 1:32 PM   LOS: 2 days   HPI/Subjective: No events overnight.   Objective: Filed Vitals:   07/13/15 2116 07/13/15 2215 07/14/15 0132 07/14/15 0430  BP: 173/73 181/84 158/74 155/72  Pulse: 114  123 113  Temp: 98.5 F (36.9 C)  98 F (36.7 C) 98.1 F (36.7 C)  TempSrc: Oral  Oral Oral  Resp: '20  20 20  ' Height:      Weight:    57.425 kg (126 lb 9.6 oz)  SpO2: 95%  100% 100%    Intake/Output Summary (Last 24 hours) at 07/14/15 1332 Last data filed at 07/14/15 1302  Gross per 24 hour  Intake   1060 ml  Output   2175 ml  Net  -1115 ml    Exam:   General:  Pt is alert, follows commands appropriately, not in acute distress  Cardiovascular: Regular rhythm, tachycardic, S1/S2, no murmurs, no rubs, no gallops  Respiratory: Clear to auscultation bilaterally, no wheezing, rhonchi at bases better  Abdomen: Soft, non tender, non distended, bowel sounds present, no guarding  Extremities: bilateral thigh edema   Data Reviewed: Basic Metabolic Panel:  Recent Labs Lab 07/09/15 2008 07/11/15 1110 07/12/15 1600 07/13/15 1000 07/14/15 0655  NA 133* 133* 133* 133* 134*  K 4.2 4.0 3.9 3.4* 3.7  CL 99* 99* 97* 99* 99*  CO2 '26 25 24 22 27  ' GLUCOSE 422* 305* 343* 221* 143*  BUN '15 9 10 8 6  ' CREATININE 0.55* 0.44* 0.51* 0.48* 0.35*  CALCIUM 9.0 9.1 9.0 9.4 9.3  MG  --   --   --  1.8  --   PHOS  --   --   --  2.4*  --    Liver Function Tests:  Recent Labs Lab 07/09/15 2008 07/11/15 1110 07/13/15 1000  AST 20 27 34  ALT 15* 19 23  ALKPHOS 446* 432* 352*  BILITOT 0.5 0.7 0.8  PROT 6.3* 6.3* 6.4*  ALBUMIN 3.3* 3.3* 3.2*   CBC:  Recent Labs Lab 07/09/15 2008  07/11/15 1110 07/12/15 1600 07/14/15 0435  WBC 32.1* 27.0* 30.8* 23.0*  NEUTROABS 28.9* 24.5* 29.0*  --   HGB 7.4* 7.6* 7.3* 10.2*  HCT 22.7* 23.3* 22.0* 29.4*  MCV 81.1 82.3 81.5 79.7  PLT 108* 85* 121* 242   Cardiac Enzymes:  Recent Labs Lab 07/09/15 2008 07/12/15 2310 07/13/15 0959 07/13/15 1545  CKTOTAL 558*  --   --   --   TROPONINI  --  0.03 0.03 <0.03   CBG:  Recent Labs Lab 07/13/15 1139 07/13/15 1619 07/13/15 2114 07/14/15 0718 07/14/15 1154  GLUCAP 322* 183* 210* 137* 259*   Recent Results (from the past 240 hour(s))  Culture, blood (routine x 2)     Status: None (Preliminary result)   Collection Time: 07/12/15  4:00 PM  Result Value Ref Range Status   Specimen Description BLOOD RIGHT CHEST  Final   Special Requests BOTTLES DRAWN AEROBIC AND ANAEROBIC 5CC EACH  Final   Culture   Final    NO GROWTH < 24 HOURS Performed at [facility]    Report Status PENDING  Incomplete  Culture, blood (routine x 2)     Status: None (Preliminary result)   Collection Time: 07/12/15 11:10 PM  Result Value Ref Range Status   Specimen Description BLOOD LEFT ANTECUBITAL  Final   Special Requests   Final    BOTTLES DRAWN AEROBIC ONLY 6 ML Performed at [facility]    Culture PENDING  Incomplete   Report Status PENDING  Incomplete  Culture, sputum-assessment     Status: None   Collection Time: 07/13/15  8:14 AM  Result Value Ref Range Status   Specimen Description SPUTUM  Final   Special Requests NONE  Final   Sputum evaluation   Final    THIS SPECIMEN IS ACCEPTABLE. RESPIRATORY CULTURE REPORT TO FOLLOW.   Report Status 07/13/2015 FINAL  Final  Culture, respiratory (NON-Expectorated)     Status: None (Preliminary result)   Collection Time: 07/13/15  8:14 AM  Result Value Ref Range Status   Specimen Description SPUTUM  Final   Special Requests NONE  Final   Gram Stain   Final    ABUNDANT WBC PRESENT,BOTH PMN AND MONONUCLEAR RARE SQUAMOUS EPITHELIAL  CELLS PRESENT FEW GRAM POSITIVE COCCI IN PAIRS RARE GRAM NEGATIVE RODS Performed at [facility]    Culture PENDING  Incomplete   Report Status PENDING  Incomplete     Scheduled Meds: . aspirin  81 mg Oral Daily  . atorvastatin  40 mg Oral QHS  . clopidogrel  75 mg Oral Q breakfast  . glipiZIDE  10 mg Oral QAC breakfast  . insulin aspart  0-5 Units Subcutaneous QHS  . insulin aspart  0-9 Units Subcutaneous TID WC  . losartan  50 mg Oral Daily  . ZOSYN  IV  3.375 g Intravenous 3 times per day  . vancomycin  750 mg Intravenous BID   Continuous Infusions:

## 2015-07-15 LAB — BASIC METABOLIC PANEL
Anion gap: 10 (ref 5–15)
BUN: 7 mg/dL (ref 6–20)
CALCIUM: 8.7 mg/dL — AB (ref 8.9–10.3)
CHLORIDE: 95 mmol/L — AB (ref 101–111)
CO2: 26 mmol/L (ref 22–32)
CREATININE: 0.41 mg/dL — AB (ref 0.61–1.24)
GFR calc Af Amer: >60 mL/min (ref >60)
Glucose, Bld: 210 mg/dL — ABNORMAL HIGH (ref 65–99)
Potassium: 3.2 mmol/L — ABNORMAL LOW (ref 3.5–5.1)
SODIUM: 131 mmol/L — AB (ref 135–145)

## 2015-07-15 LAB — GLUCOSE, CAPILLARY
GLUCOSE-CAPILLARY: 188 mg/dL — AB (ref 65–99)
GLUCOSE-CAPILLARY: 283 mg/dL — AB (ref 65–99)
Glucose-Capillary: 185 mg/dL — ABNORMAL HIGH (ref 65–99)
Glucose-Capillary: 324 mg/dL — ABNORMAL HIGH (ref 65–99)
Glucose-Capillary: 228 mg/dL — ABNORMAL HIGH (ref 65–99)

## 2015-07-15 LAB — CBC
HCT: 28.4 % — ABNORMAL LOW (ref 39.0–52.0)
Hemoglobin: 9.3 g/dL — ABNORMAL LOW (ref 13.0–17.0)
MCH: 26.1 pg (ref 26.0–34.0)
MCHC: 32.7 g/dL (ref 30.0–36.0)
MCV: 79.8 fL (ref 78.0–100.0)
PLATELETS: 198 10*3/uL (ref 150–400)
RBC: 3.56 MIL/uL — ABNORMAL LOW (ref 4.22–5.81)
RDW: 15.8 % — AB (ref 11.5–15.5)
WBC: 21.9 10*3/uL — ABNORMAL HIGH (ref 4.0–10.5)

## 2015-07-15 MED ORDER — BISACODYL 10 MG RE SUPP
10.0000 mg | Freq: Every day | RECTAL | Status: DC
  Administered 2015-07-15 – 2015-07-19 (×5): 10 mg via RECTAL
  Filled 2015-07-15 (×5): qty 1

## 2015-07-15 MED ORDER — POTASSIUM CHLORIDE CRYS ER 20 MEQ PO TBCR: 40.0000 meq | EXTENDED_RELEASE_TABLET | Freq: Once | ORAL | Status: DC

## 2015-07-15 MED ORDER — POTASSIUM CHLORIDE CRYS ER 20 MEQ PO TBCR
40.0000 meq | EXTENDED_RELEASE_TABLET | Freq: Once | ORAL | Status: AC
  Administered 2015-07-15: 40 meq via ORAL
  Filled 2015-07-15: qty 2

## 2015-07-15 MED ORDER — SENNOSIDES-DOCUSATE SODIUM 8.6-50 MG PO TABS
1.0000 | ORAL_TABLET | Freq: Two times a day (BID) | ORAL | Status: DC
  Administered 2015-07-15 – 2015-07-19 (×9): 1 via ORAL
  Filled 2015-07-15 (×9): qty 1

## 2015-07-15 MED ORDER — LEVOFLOXACIN 500 MG PO TABS
500.0000 mg | ORAL_TABLET | Freq: Every day | ORAL | Status: DC
  Administered 2015-07-15 – 2015-07-19 (×5): 500 mg via ORAL
  Filled 2015-07-15 (×5): qty 1

## 2015-07-15 NOTE — Progress Notes (Signed)
Patient had recurrent pain episodes this past shift, identifying the bilateral anterior thighs as the focal points of tenderness. Patient was medicated with PRN oral doses of Immediate-Release Oxycodone for his pain occurrences, with good outcome.

## 2015-07-15 NOTE — Progress Notes (Signed)
PHARMACY NOTE -  Levaquin for PNA  Pharmacy has been assisting with dosing of antibiotics for PNA.  Vancomycin & Zosyn changed to Levaquin 500mg  PO daily.   Need for further dosage adjustment appears unlikely at present.    Will sign off at this time.  Please reconsult if a change in clinical status warrants re-evaluation of dosage.  Netta Cedars, PharmD, BCPS Pager: [phone] 07/15/2015@2 :47 PM

## 2015-07-15 NOTE — Clinical Social Work Placement (Signed)
   CLINICAL SOCIAL WORK PLACEMENT  NOTE  Date:  07/15/2015  Patient Details  Name: SHAQUIEL WESTOVER MRN: [id number] Date of Birth: 05-May-1946  Clinical Social Work is seeking post-discharge placement for this patient at the [place] level of care (*CSW will initial, date and re-position this form in  chart as items are completed):  Yes   Patient/family provided with [facility] Work Department's list of facilities offering this level of care within the geographic area requested by the patient (or if unable, by the patient's family).  Yes   Patient/family informed of their freedom to choose among providers that offer the needed level of care, that participate in Medicare, Medicaid or managed care program needed by the patient, have an available bed and are willing to accept the patient.  Yes   Patient/family informed of [facility]'s ownership interest in [facility] and [facility], as well as of the fact that they are under no obligation to receive care at these facilities.  PASRR submitted to EDS on 07/15/15     PASRR number received on 07/15/15     Existing PASRR number confirmed on       FL2 transmitted to all facilities in geographic area requested by pt/family on 07/15/15     FL2 transmitted to all facilities within larger geographic area on       Patient informed that his/her managed care company has contracts with or will negotiate with certain facilities, including the following:            Patient/family informed of bed offers received.  Patient chooses bed at       Physician recommends and patient chooses bed at      Patient to be transferred to   on  .  Patient to be transferred to facility by       Patient family notified on   of transfer.  Name of family member notified:        PHYSICIAN Please sign FL2     Additional Comment:    _______________________________________________ Ladell Pier, LCSW 07/15/2015,  3:42 PM

## 2015-07-15 NOTE — NC FL2 (Signed)
[place] LEVEL OF CARE SCREENING TOOL     IDENTIFICATION  Patient Name: Ricky Terry Birthdate: 1946/05/05 Sex: male Admission Date (Current Location): 07/12/2015  [place] and [place] Number: [facility] and Address:  [facility],  [place] [address], [place]      Provider Number: [id number]  Attending Physician Name and Address:  Theodis Blaze, MD  Relative Name and Phone Number:       Current Level of Care: Hospital Recommended Level of Care: [place] Prior Approval Number:    Date Approved/Denied:   PASRR Number: [id number]  Discharge Plan: SNF    Current Diagnoses: Patient Active Problem List   Diagnosis Date Noted  . Accelerated hypertension 07/13/2015  . Acute respiratory failure with hypoxia ([place]) 07/13/2015  . Sepsis due to mulitfocal pneumonia ([place]) 07/13/2015  . Hyponatremia 07/13/2015  . Hypokalemia 07/13/2015  . DM type 2, uncontrolled, with neuropathy ([place]) 07/13/2015  . Chronic diastolic CHF (congestive heart failure), NYHA class 1 ([place]) 07/13/2015  . Thrombocytopenia ([place]) 07/13/2015  . Anemia of chronic disease, prostate cancer 07/13/2015  . Aspiration pneumonia ([place]) 07/12/2015  . Tachycardia 07/12/2015  . Protein-calorie malnutrition, severe 06/14/2015  . Prostate cancer ([place]) 05/23/2015    Orientation ACTIVITIES/SOCIAL BLADDER RESPIRATION    Self, Time, Situation, Place  Active Continent Normal  BEHAVIORAL SYMPTOMS/MOOD NEUROLOGICAL BOWEL NUTRITION STATUS     (NONE) Continent Diet (Carb Modified)  PHYSICIAN VISITS COMMUNICATION OF NEEDS Height & Weight Skin    Verbally 5\' 6"  (167.6 cm) 126 lbs. Normal          AMBULATORY STATUS RESPIRATION    Supervision limited Normal      Personal Care Assistance Level of Assistance  Bathing, Dressing, Feeding Bathing Assistance: Limited assistance Feeding assistance: Independent Dressing Assistance: Limited assistance       Functional Limitations Info  Sight, Hearing, Speech Sight Info: Adequate Hearing Info: Adequate Speech Info: Adequate       SPECIAL CARE FACTORS FREQUENCY  PT (By licensed PT), OT (By licensed OT)     PT Frequency: 5 x a week OT Frequency: 5 x a week           Additional Factors Info  Code Status, Allergies Code Status Info: FULL code status. Allergies Info: No Known Allergies           Current Medications (07/15/2015): Current Facility-Administered Medications  Medication Dose Route Frequency Provider Last Rate Last Dose  . aspirin chewable tablet 81 mg  81 mg Oral Daily Toy Baker, MD   81 mg at 07/15/15 0928  . atorvastatin (LIPITOR) tablet 40 mg  40 mg Oral QHS Toy Baker, MD   40 mg at 07/14/15 2158  . bisacodyl (DULCOLAX) suppository 10 mg  10 mg Rectal Daily Theodis Blaze, MD   10 mg at 07/15/15 1425  . feeding supplement (ENSURE ENLIVE) (ENSURE ENLIVE) liquid 237 mL  237 mL Oral BID BM Clayton Bibles, RD   237 mL at 07/15/15 1421  . HYDROmorphone (DILAUDID) injection 1 mg  1 mg Intravenous Q4H PRN Theodis Blaze, MD   1 mg at 07/14/15 1858  . insulin aspart (novoLOG) injection 0-5 Units  0-5 Units Subcutaneous QHS Toy Baker, MD   2 Units at 07/13/15 2204  . insulin aspart (novoLOG) injection 0-9 Units  0-9 Units Subcutaneous TID WC Toy Baker, MD   5 Units at 07/15/15 1254  . insulin glargine (LANTUS) injection 5 Units  5 Units Subcutaneous QHS Theodis Blaze, MD   5 Units at 07/14/15 2222  . levofloxacin (LEVAQUIN) tablet 500 mg  500 mg Oral Daily Theodis Blaze, MD   500 mg at 07/15/15 1425  . losartan (COZAAR) tablet 50 mg  50 mg Oral Daily Toy Baker, MD   50 mg at 07/15/15 0928  . oxyCODONE (Oxy IR/ROXICODONE) immediate release tablet 10 mg  10 mg Oral Q4H PRN Toy Baker, MD   10 mg at 07/15/15 1425  . polyethylene glycol (MIRALAX / GLYCOLAX) packet 17 g  17 g Oral BID Theodis Blaze, MD   17 g at 07/15/15 [id number]  .  senna-docusate (Senokot-S) tablet 1 tablet  1 tablet Oral BID Theodis Blaze, MD   1 tablet at 07/15/15 1425  . sodium chloride 0.9 % injection 10-40 mL  10-40 mL Intracatheter PRN Toy Baker, MD   10 mL at 07/14/15 0434   Do not use this list as official medication orders. Please verify with discharge summary.  Discharge Medications:   Medication List    ASK your doctor about these medications        aspirin 81 MG chewable tablet  Chew 1 tablet (81 mg total) by mouth daily.     atorvastatin 40 MG tablet  Commonly known as:  LIPITOR  Take 1 tablet (40 mg total) by mouth at bedtime.     cholecalciferol 400 UNITS Tabs tablet  Commonly known as:  VITAMIN D  Take 400 Units by mouth daily.     clopidogrel 75 MG tablet  Commonly known as:  PLAVIX  Take 1 tablet (75 mg total) by mouth daily with breakfast.     glipiZIDE 10 MG tablet  Commonly known as:  GLUCOTROL  Take 1 tablet (10 mg total) by mouth daily before breakfast.     lidocaine-prilocaine cream  Commonly known as:  EMLA  Apply 1 application topically as needed.     losartan 50 MG tablet  Commonly known as:  COZAAR  Take 1 tablet (50 mg total) by mouth daily.     metFORMIN 1000 MG tablet  Commonly known as:  GLUCOPHAGE  Take 1 tablet (1,000 mg total) by mouth 2 (two) times daily.     NEULASTA [place]  Inject 1 Dose into the skin.     oxyCODONE-acetaminophen 5-325 MG tablet  Commonly known as:  PERCOCET/ROXICET  Take 1 tablet by mouth every 6 (six) hours as needed for severe pain.     prochlorperazine 10 MG tablet  Commonly known as:  COMPAZINE  Take 1 tablet (10 mg total) by mouth every 6 (six) hours as needed for nausea or vomiting.        Relevant Imaging Results:  Relevant Lab Results:  Recent Labs    Additional Information SSN: [id number] Patient has prostate cancer, under the care of Dr[facility] Alen Blew at [facility], pt currently on systemic chemotherapy in the form of Taxotere. His  next treatment is 07/19/2015 and depending on his clinical status will determine whether he will receive that at that time. Pt has been referred to radiation oncology for palliative radiation therapy but have not started radiation at this time and has been hospitalized since. To follow up as an outpatient. Next appointment schedulded for 07/19/2015  KIDD, SUZANNA A, LCSW

## 2015-07-15 NOTE — Progress Notes (Signed)
Patient ID: Ricky Terry, male   DOB: Dec 26, 1945, 69 y.o.   MRN: [id number]  [facility] PROGRESS NOTE  Ricky Terry TDH:[id number] DOB: [birth year] DOA: 07/12/2015 PCP: [name], MD   Brief narrative:    69 y.o. 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 Pt presented to [facility] ED with main concern of several days duration of progressive dyspnea mostly with exertion but occasionally present at rest, associated with productive cough of greenish sputum, fevers, chills, malaise.  In ED, CTA of the chest notable for multiple sclerotic lesion thoracic spine and multiple airspace opacities in the right middle lower lobes consistent multifocal pneumonia. [facility] asked admit pt for further evaluation.   Assessment/Plan:    Principal Problem:   Acute respiratory failure with hypoxia (HCC) - secondary to right middle and lower lobe PNA and ? Aspiration PNA - today is day #4 of vanc and zosyn, will change to oral Levaquin today  - follow up on blood cultures, sputum analysis   Active Problems:   Protein-calorie malnutrition, severe - nutritionist consulted - pt tolerating diet well    Opioid induced constipation  - place on Miralax, senokot    Sepsis due to mulitfocal pneumonia ([place]) - please note that pt met criteria for sepsis on admission with T 100.2 F, HR > 90, RR > 22, WBC 30 K - on vancomycin and zosyn, today is day #4, transition to oral Levaquin today  - pt is clinically stable this AM, appears to be responding to current regimen - follow up on blood and sputum cultures     Tachycardia - this is reactive and secondary to sepsis and PNA outlined above - treat underlying problem    Accelerated hypertension - currently on losartan and will continue same regimen  - added hydralazine as needed for better BP  control - would not add scheduled antihypertensive regimen as I suspect that BO will stabilize once sepsis is resolved     DM type 2, uncontrolled, with neuropathy ([place]) - on metformin and glipizide at home - continue holding both medications for now  - A1C 10/25 was > 14, continue with Lantus 5 U and will continue SSI - diabetic educator consulted and we certainly appreciate recommendations     Thrombocytopenia ([place]) - no signs of bleeding, resolved  - CBC in AM    Anemia of malignancy - anemia panel pending  - pt transfused two units of PRBC, Hg up to 10.2 this AM - continue to hold Plavix for now    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 Hazeline Junker assistance  - pt was on neulasta and steroids     Hyponatremia - appears to be pre renal - Na down this AM, encouraged oral intake     Hypokalemia - supplement and repeat BMP in AM    Chronic diastolic CHF (congestive heart failure), NYHA class 1 ([place]) - last 2 D ECHO done in Oct 2016, no need for repeat 2 D ECHO - grade I diastolic CHF noted on last 2 D ECHO - monitor daily weights, strict I/O  DVT prophylaxis - SCD's  Code Status: Full.  Family Communication:  plan of care discussed with the patient Disposition Plan: Home when sepsis resolved, possibly 11/27  IV access:  Peripheral IV  Procedures and diagnostic studies:    Dg Chest  2 View 07/12/2015 Right lower lobe infiltrate consistent with pneumonia. Lungs elsewhere clear. Areas of sclerotic bony metastatic disease.   Ct Angio Chest Pe W/cm &/or Wo Cm 07/12/2015 There is no evidence of pulmonary embolus. Multiple sclerotic lesions are noted in the thoracic spine consistent with osseous metastases. Multiple ill-defined airspace opacities are noted in the right middle and lower lobes consistent with multifocal pneumonia. There is also noted fluid or debris in the lower lobe bronchi bilaterally, but most prominently on the right.  This is concerning for mucous plugging or aspiration.   Medical Consultants:  Oncology   Other Consultants:  PT Diabetic educator   IAnti-Infectives:   Vancomycin and Zosyn 11/23 --> transitioned to oral Levaquin 11/26 -->  [name], MD  [facility] Pager [phone]  If 7PM-7AM, please contact night-coverage [email or web address] Password [email or web address] 07/15/2015, 1:26 PM   LOS: 3 days   HPI/Subjective: No events overnight. Constipation.   Objective: Filed Vitals:   07/14/15 1852 07/14/15 2228 07/15/15 0542 07/15/15 0552  BP: 154/60 162/72 160/63   Pulse: 117 118 117   Temp: 98.9 F (37.2 C) 98.3 F (36.8 C) 98.5 F (36.9 C)   TempSrc: Oral Oral Oral   Resp: '14 16 20   ' Height:      Weight:    57.2 kg (126 lb 1.7 oz)  SpO2: 99% 100% 99%     Intake/Output Summary (Last 24 hours) at 07/15/15 1326 Last data filed at 07/15/15 1300  Gross per 24 hour  Intake    230 ml  Output   1450 ml  Net  -1220 ml    Exam:   General:  Pt is alert, follows commands appropriately, not in acute distress  Cardiovascular: Regular rhythm, tachycardic, S1/S2, no murmurs, no rubs, no gallops  Respiratory: Clear to auscultation bilaterally, no wheezing, rhonchi at bases better  Abdomen: Soft, non tender, non distended, bowel sounds present, no guarding  Extremities: bilateral thigh edema   Data Reviewed: Basic Metabolic Panel:  Recent Labs Lab 07/11/15 1110 07/12/15 1600 07/13/15 1000 07/14/15 0655 07/15/15 0432  NA 133* 133* 133* 134* 131*  K 4.0 3.9 3.4* 3.7 3.2*  CL 99* 97* 99* 99* 95*  CO2 '25 24 22 27 26  ' GLUCOSE 305* 343* 221* 143* 210*  BUN '9 10 8 6 7  ' CREATININE 0.44* 0.51* 0.48* 0.35* 0.41*  CALCIUM 9.1 9.0 9.4 9.3 8.7*  MG  --   --  1.8  --   --   PHOS  --   --  2.4*  --   --    Liver Function Tests:  Recent Labs Lab 07/09/15 2008 07/11/15 1110 07/13/15 1000  AST 20 27 34  ALT 15* 19 23  ALKPHOS 446* 432* 352*  BILITOT 0.5 0.7 0.8  PROT 6.3* 6.3* 6.4*   ALBUMIN 3.3* 3.3* 3.2*   CBC:  Recent Labs Lab 07/09/15 2008 07/11/15 1110 07/12/15 1600 07/14/15 0435 07/15/15 0432  WBC 32.1* 27.0* 30.8* 23.0* 21.9*  NEUTROABS 28.9* 24.5* 29.0*  --   --   HGB 7.4* 7.6* 7.3* 10.2* 9.3*  HCT 22.7* 23.3* 22.0* 29.4* 28.4*  MCV 81.1 82.3 81.5 79.7 79.8  PLT 108* 85* 121* 242 198   Cardiac Enzymes:  Recent Labs Lab 07/09/15 2008 07/12/15 2310 07/13/15 0959 07/13/15 1545  CKTOTAL 558*  --   --   --   TROPONINI  --  0.03 0.03 <0.03   CBG:  Recent Labs Lab 07/14/15 1154 07/14/15 1839 07/14/15 2222 07/15/15 0739  07/15/15 1216  GLUCAP 259* 302* 188* 185* 283*   Recent Results (from the past 240 hour(s))  Culture, blood (routine x 2)     Status: None (Preliminary result)   Collection Time: 07/12/15  4:00 PM  Result Value Ref Range Status   Specimen Description BLOOD RIGHT CHEST  Final   Special Requests BOTTLES DRAWN AEROBIC AND ANAEROBIC 5CC EACH  Final   Culture   Final    NO GROWTH 3 DAYS Performed at [facility]    Report Status PENDING  Incomplete  Culture, blood (routine x 2)     Status: None (Preliminary result)   Collection Time: 07/12/15 11:10 PM  Result Value Ref Range Status   Specimen Description BLOOD LEFT ANTECUBITAL  Final   Special Requests BOTTLES DRAWN AEROBIC ONLY 6 ML  Final   Culture   Final    NO GROWTH 2 DAYS Performed at [facility]    Report Status PENDING  Incomplete  Culture, sputum-assessment     Status: None   Collection Time: 07/13/15  8:14 AM  Result Value Ref Range Status   Specimen Description SPUTUM  Final   Special Requests NONE  Final   Sputum evaluation   Final    THIS SPECIMEN IS ACCEPTABLE. RESPIRATORY CULTURE REPORT TO FOLLOW.   Report Status 07/13/2015 FINAL  Final  Culture, respiratory (NON-Expectorated)     Status: None (Preliminary result)   Collection Time: 07/13/15  8:14 AM  Result Value Ref Range Status   Specimen Description SPUTUM  Final   Special  Requests NONE  Final   Gram Stain   Final    ABUNDANT WBC PRESENT,BOTH PMN AND MONONUCLEAR RARE SQUAMOUS EPITHELIAL CELLS PRESENT FEW GRAM POSITIVE COCCI IN PAIRS RARE GRAM NEGATIVE RODS Performed at [facility]    Culture PENDING  Incomplete   Report Status PENDING  Incomplete    Scheduled Meds: . aspirin  81 mg Oral Daily  . atorvastatin  40 mg Oral QHS  . clopidogrel  75 mg Oral Q breakfast  . glipiZIDE  10 mg Oral QAC breakfast  . insulin aspart  0-5 Units Subcutaneous QHS  . insulin aspart  0-9 Units Subcutaneous TID WC  . losartan  50 mg Oral Daily  . ZOSYN  IV  3.375 g Intravenous 3 times per day  . vancomycin  750 mg Intravenous BID

## 2015-07-15 NOTE — Plan of Care (Signed)
Problem: Safety: Goal: Ability to remain free from injury will improve Outcome: Progressing Falls prevention protocol maintained. Reviewed with patient and reinforced the importance of adherence to safety measures to reduce likelihood for falls or fall-related injuries, such as appropriate use of the call bell,  maintaining the bed in low and locked position, keeping needed items within easy access and consistent wearing of non-skid footwear during ambulation attempts. Patient verbalized understanding and demonstrated compliance.         

## 2015-07-15 NOTE — Clinical Social Work Note (Signed)
Clinical Social Work Assessment  Patient Details  Name: Ricky Terry MRN: [id number] Date of Birth: 1945/11/11  Date of referral:  07/15/15               Reason for consult:  Discharge Planning                Permission sought to share information with:  Family Supports Permission granted to share information::  Yes, Verbal Permission Granted  Name::     Ricky Terry  Agency::     Relationship::  daughter  Sport and exercise psychologist Information:  daughter phone number not listed in [place], pt provided permission for CSW to contact pt brother to obtain dtr phone number, message left for pt brother at [phone]  Housing/Transportation Living arrangements for the past 2 months:  Apartment Source of Information:  Patient Patient Interpreter Needed:  None Criminal Activity/Legal Involvement Pertinent to Current Situation/Hospitalization:  No - Comment as needed Significant Relationships:  Adult Children, Siblings Lives with:  Self Do you feel safe going back to the place where you live?  Yes (pt hopeful that family can provide 24 hour care at home, but does not yet have plan in place with family) Need for family participation in patient care:  Yes (Comment)  Care giving concerns:  Pt lives alone and PT recommending 24 hour assistance. Pt hopeful that pt daughter will be able to provide 24 hour assistance, but pt states that he has not yet spoken to daughter or other family members to formulate plan-asked CSW to contact pt daughter. PT recommending [facility] SNF if 24 hour assistance not available.   Social Worker assessment / plan:  CSW reviewed chart and noted that PT recommending [facility] SN[facility] if 24 hour assistance not available. Per PT note, pt lives alone, but told PT that he could get someone to stay with him upon discharge and PT recommends [facility] SN[facility] if 24 hr care not available.  CSW met with pt at bedside. CSW introduced self and explained role. Pt reports that he lives at home alone. CSW discussed with pt that  CSW noted that pt stated to PT that he would be able to arrange someone to assist him at home. Pt stated, "yeah". When CSW inquired further pt stated he hopes that his daughter can assist at home, but reports to CSW that he has not yet discussed with pt daughter, Ricky Terry. Pt asked CSW to speak to pt daughter. CSW reviewed pt emergency contact list and pt daughter is not listed. Pt unable to provide phone number for pt daughter, but provided permission for CSW to contact pt brother who is listed on emergency contact to get pt daughter phone number. CSW discussed with pt that recommendation if 24 hour care cannot be arranged is for short term rehab. Pt discussed that he was concerned about paying for short term rehab. CSW explained that insurance provides some coverage for rehab and CSW can do search to have a back up plan and request information from facilities regarding insurance coverage for SNF. Pt agreeable to CSW initiated [place] SNF search to have options and obtain further information from facilities how pt insurance, [employer] covers SNF.   CSW contacted pt brother via telephone and left voice message. Awaiting return phone call.   CSW completed FL2 and initiated SNF search to [facility] and requested facilities respond providing information about insurance coverage as pt main hesitation about rehab at SNF is out of pocket costs.   CSW to  follow up with pt regarding disposition plan and provide pt with bed offers and information around pt insurance coverage for SNF.   CSW to continue to follow to provide support and assist with pt discharge planning needs.    Employment status:  Retired Nurse, adult PT Recommendations:  Home with [place], [place], [place] (PT recommending [facility] 24 hour care, but if pt does not have 24 hour care recommend [facility] SN[facility]) Information / Referral to community resources:  [place]  Patient/Family's Response to care:  Pt alert and oriented x 4. Pt hopeful that pt family can assist with 24 hour care at home, but was unable to provide CSW with definitive plan for assistance as he had not yet spoken to pt daughter and was unable to provide CSW with pt daughter phone number. Awaiting return phone call from pt brother to obtain pt daughter phone number and further assess if assistance can be provided in the home. ST rehab search initiated to have back up option available if pt agreeable to plan and agreeable to how insurance covers rehab.  Patient/Family's Understanding of and Emotional Response to Diagnosis, Current Treatment, and Prognosis:  Pt displayed limited awareness around amount of assistance pt needing at this time. Pt hesitation about rehab at SNF surrounds potential out of pocket costs if pt insurance does not cover full amount at SNF.   Emotional Assessment Appearance:  Appears stated age Attitude/Demeanor/Rapport:  Other (pt appropriate) Affect (typically observed):  Appropriate, Calm Orientation:  Oriented to Self, Oriented to Place, Oriented to  Time, Oriented to Situation Alcohol / Substance use:  Not Applicable Psych involvement (Current and /or in the community):  No (Comment)  Discharge Needs  Concerns to be addressed:  Discharge Planning Concerns Readmission within the last 30 days:  Yes Current discharge risk:  Lives alone Barriers to Discharge:  Continued Medical Work up, [place], [place], LCSW 07/15/2015, 3:45 PM [phone]

## 2015-07-16 LAB — CBC
HCT: 28.3 % — ABNORMAL LOW (ref 39.0–52.0)
HEMOGLOBIN: 9.4 g/dL — AB (ref 13.0–17.0)
MCH: 26.9 pg (ref 26.0–34.0)
MCHC: 33.2 g/dL (ref 30.0–36.0)
MCV: 81.1 fL (ref 78.0–100.0)
PLATELETS: 232 10*3/uL (ref 150–400)
RBC: 3.49 MIL/uL — ABNORMAL LOW (ref 4.22–5.81)
RDW: 16.3 % — ABNORMAL HIGH (ref 11.5–15.5)
WBC: 20 10*3/uL — ABNORMAL HIGH (ref 4.0–10.5)

## 2015-07-16 LAB — BASIC METABOLIC PANEL
Anion gap: 10 (ref 5–15)
BUN: 6 mg/dL (ref 6–20)
CALCIUM: 9 mg/dL (ref 8.9–10.3)
CO2: 27 mmol/L (ref 22–32)
Chloride: 95 mmol/L — ABNORMAL LOW (ref 101–111)
Creatinine, Ser: 0.44 mg/dL — ABNORMAL LOW (ref 0.61–1.24)
GFR calc Af Amer: >60 mL/min (ref >60)
Glucose, Bld: 209 mg/dL — ABNORMAL HIGH (ref 65–99)
Potassium: 3.6 mmol/L (ref 3.5–5.1)
SODIUM: 132 mmol/L — AB (ref 135–145)

## 2015-07-16 LAB — CULTURE, RESPIRATORY

## 2015-07-16 LAB — GLUCOSE, CAPILLARY
GLUCOSE-CAPILLARY: 141 mg/dL — AB (ref 65–99)
GLUCOSE-CAPILLARY: 268 mg/dL — AB (ref 65–99)
Glucose-Capillary: 202 mg/dL — ABNORMAL HIGH (ref 65–99)
Glucose-Capillary: 193 mg/dL — ABNORMAL HIGH (ref 65–99)

## 2015-07-16 LAB — CULTURE, RESPIRATORY W GRAM STAIN

## 2015-07-16 MED ORDER — LEVOFLOXACIN 500 MG PO TABS: 500.0000 mg | ORAL_TABLET | Freq: Every day | ORAL | Status: DC

## 2015-07-16 MED ORDER — SENNOSIDES-DOCUSATE SODIUM 8.6-50 MG PO TABS: 1.0000 | ORAL_TABLET | Freq: Two times a day (BID) | ORAL | Status: AC

## 2015-07-16 MED ORDER — OXYCODONE-ACETAMINOPHEN 5-325 MG PO TABS: 1.0000 | ORAL_TABLET | Freq: Four times a day (QID) | ORAL | Status: DC | PRN

## 2015-07-16 MED ORDER — HYDRALAZINE HCL 20 MG/ML IJ SOLN
5.0000 mg | Freq: Once | INTRAMUSCULAR | Status: AC
  Administered 2015-07-16: 5 mg via INTRAVENOUS
  Filled 2015-07-16: qty 1

## 2015-07-16 NOTE — Discharge Instructions (Signed)
Aspiration Pneumonia  Aspiration pneumonia is an infection in your lungs. It occurs when food, liquid, or stomach contents (vomit) are inhaled (aspirated) into your lungs. When these things get into your lungs, swelling (inflammation) and infection can occur. This can make it difficult for you to breathe. Aspiration pneumonia is a serious condition and can be life threatening. RISK FACTORS Aspiration pneumonia is more likely to occur when a person's cough (gag) reflex or ability to swallow has been decreased. Some things that can do this include:   Having a brain injury or disease, such as stroke, seizures, Parkinson's disease, dementia, or amyotrophic lateral sclerosis (ALS).   Being given general anesthetic for procedures.   Being in a coma (unconscious).   Having a narrowing of the tube that carries food to the stomach (esophagus).   Drinking too much alcohol. If a person passes out and vomits, vomit can be swallowed into the lungs.   Taking certain medicines, such as tranquilizers or sedatives.  SIGNS AND SYMPTOMS   Coughing after swallowing food or liquids.   Breathing problems, such as wheezing or shortness of breath.   Bluish skin. This can be caused by lack of oxygen.   Coughing up food or mucus. The mucus might contain blood, greenish material, or yellowish-white fluid (pus).   Fever.   Chest pain.   Being more tired than usual (fatigue).   Sweating more than usual.   Bad breath.  DIAGNOSIS  A physical exam will be done. During the exam, the health care provider will listen to your lungs with a stethoscope to check for:   Crackling sounds in the lungs.  Decreased breath sounds.  A rapid heartbeat. Various tests may be ordered. These may include:   Chest X-ray.   CT scan.   Swallowing study. This test looks at how food is swallowed and whether it goes into your breathing tube (trachea) or food pipe (esophagus).   Sputum culture. Saliva and  mucus (sputum) are collected from the lungs or the tubes that carry air to the lungs (bronchi). The sputum is then tested for bacteria.   Bronchoscopy. This test uses a flexible tube (bronchoscope) to see inside the lungs. TREATMENT  Treatment will usually include antibiotic medicines. Other medicines may also be used to reduce fever or pain. You may need to be treated in the hospital. In the hospital, your breathing will be carefully monitored. Depending on how well you are breathing, you may need to be given oxygen, or you may need breathing support from a breathing machine (ventilator). For people who fail a swallowing study, a feeding tube might be placed in the stomach, or they may be asked to avoid certain food textures or liquids when they eat. HOME CARE INSTRUCTIONS   Carefully follow any special eating instructions you were given, such as avoiding certain food textures or thickening liquids. This reduces the risk of developing aspiration pneumonia again.  Only take over-the-counter or prescription medicines as directed by your health care provider. Follow the directions carefully.   If you were prescribed antibiotics, take them as directed. Finish them even if you start to feel better.   Rest as instructed by your health care provider.   Keep all follow-up appointments with your health care provider.  SEEK MEDICAL CARE IF:   You develop worsening shortness of breath, wheezing, or difficulty breathing.   You develop a fever.   You have chest pain.  MAKE SURE YOU:   Understand these instructions.  Will watch   your condition.  Will get help right away if you are not doing well or get worse.   This information is not intended to replace advice given to you by your health care provider. Make sure you discuss any questions you have with your health care provider.   Document Released: 06/02/2009 Document Revised: 08/10/2013 Document Reviewed: 01/21/2013 Elsevier  Interactive Patient Education 2016 Elsevier Inc.  

## 2015-07-16 NOTE — Clinical Social Work Note (Addendum)
CSW was notified by [facility] [employer] that patient does not preliminarily qualify for SNF as the patient is ambulating 200+ feet min assist.  CSW informed medical staff and patient made aware.  Patient continues attempts to formulate plan of supervision after discharge home.  Nonnie Done, LCSW ([id number]) [id number]  Licensed Clinical Social Worker

## 2015-07-16 NOTE — Progress Notes (Signed)
Physical Therapy Treatment Patient Details Name: Ricky Terry MRN: [id number] DOB: Sep 20, 1945 Today's Date: 07/16/2015    History of Present Illness 69 y.o. 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    PT Comments    Patient found to have severe pain in bilateral  Thighs with attempts to sit at the edge of the bed. Max assist to sit  On the bed edge. Patient unable to tolerate knee flexion, unable to  Attempt standing. RN aware of the change in function since last PT visit on 11/24 where patient ambulated x 220'. RN notified MD, no  Need for xrays per MD. Would recommend BEDREST at this time as patient is unable to attempt standing. Patient has history of mets to both femurs.  Follow Up Recommendations   (if  femers are more involved, may now be nonambulatory.)     Equipment Recommendations  None recommended by PT    Recommendations for Other Services       Precautions / Restrictions Precautions Precautions: Fall Precaution Comments: unablae to tolerate knee flexion, no standing. extremely painful to touch thighs    Mobility  Bed Mobility Overal bed mobility: Needs Assistance Bed Mobility: Supine to Sit;Sit to Supine     Supine to sit: Max assist Sit to supine: +2 for safety/equipment;+2 for physical assistance;Total assist   General bed mobility comments: assist for legs to edge and gradually down to the floor, required total assist for the legs and trunk back into bed.  Transfers                 General transfer comment: patient is unable to flex knees more than 45 degrees, unable to tolerate any pressure through the  legs to stand. Assisted back into bed. RN aware of change in status and painful thighs. Patient has  metastatic prostate cancer with femur involvement.   Ambulation/Gait                  Stairs            Wheelchair Mobility    Modified Rankin (Stroke Patients Only)       Balance Overall balance assessment: Needs assistance Sitting-balance support: Bilateral upper extremity supported;Feet supported Sitting balance-Leahy Scale: Poor Sitting balance - Comments: max upper body support                            Cognition Arousal/Alertness: Awake/alert                          Exercises      General Comments        Pertinent Vitals/Pain Pain Assessment: Faces Faces Pain Scale: Hurts worst Pain Location: bil thighs , can barely touch the skin without response from the patient relating the pain., Noted edema of the thighs Pain Descriptors / Indicators: Discomfort;Grimacing;Guarding;Tender    Home Living                      Prior Function            PT Goals (current goals can now be found in the care plan section) Acute Rehab PT Goals PT Goal Formulation: With patient Time For Goal Achievement: 07/30/15 Potential to Achieve Goals: Fair Progress towards PT goals: Not  progressing toward goals - comment (has had major change in functional status related to bilateral femur pain. )    Frequency  Min 3X/week    PT Plan Discharge plan needs to be updated    Co-evaluation             End of Session   Activity Tolerance: Patient limited by pain Patient left: in bed;with call bell/phone within reach;with bed alarm set     Time: [id number] PT Time Calculation (min) (ACUTE ONLY): 26 min  Charges:  $Therapeutic Activity: 23-37 mins                    G Codes:      Claretha Cooper 07/16/2015, 2:45 PM Tresa Endo PT [phone]

## 2015-07-16 NOTE — Care Management Important Message (Signed)
Important Message  Patient Details  Name: Ricky Terry MRN: [id number] Date of Birth: 1946/01/16   Medicare Important Message Given:  Yes    Erenest Rasher, RN 07/16/2015, 1:22 PM

## 2015-07-16 NOTE — Progress Notes (Signed)
Patient ID: Ricky Terry, male   DOB: 27-Jun-1946, 69 y.o.   MRN: [id number]  [facility] PROGRESS NOTE  Ricky Terry [id number]:[id number] DOB: 31-May-1946 DOA: 07/12/2015 PCP: [name], MD   Brief narrative:    69 y.o. 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 Pt presented to [facility] ED with main concern of several days duration of progressive dyspnea mostly with exertion but occasionally present at rest, associated with productive cough of greenish sputum, fevers, chills, malaise.  In ED, CTA of the chest notable for multiple sclerotic lesion thoracic spine and multiple airspace opacities in the right middle lower lobes consistent multifocal pneumonia. [facility] asked admit pt for further evaluation.   Assessment/Plan:    Principal Problem:   Acute respiratory failure with hypoxia (HCC) - secondary to right middle and lower lobe PNA and ? Aspiration PNA - completed 4 days of vanc and zosyn, Levaquin day #2 - BC negative to date   Active Problems:   Bilateral thigh pain - likely from metastatic disease - provide analgesia as needed    Protein-calorie malnutrition, severe - nutritionist consulted - pt tolerating diet well    Opioid induced constipation  - place on Miralax, senokot    Sepsis due to mulitfocal pneumonia ([place]) - please note that pt met criteria for sepsis on admission with T 100.2 F, HR > 90, RR > 22, WBC 30 K - ABX as noted above  - appears to be responding to current regimen - follow up on blood and sputum cultures     Tachycardia - this is reactive and secondary to sepsis and PNA outlined above - treat underlying problem    Accelerated hypertension - currently on losartan and will continue same regimen  - added hydralazine as needed for better BP control    DM type 2, uncontrolled, with  neuropathy ([place]) - on metformin and glipizide at home - continue holding both medications for now  - A1C 10/25 was > 14, continue with Lantus 5 U and will continue SSI - diabetic educator consulted and we certainly appreciate recommendations     Thrombocytopenia ([place]) - no signs of bleeding, resolved  - CBC in AM    Anemia of malignancy - anemia panel pending  - pt transfused two units of PRBC, Hg up to 10.2 this AM - continue to hold Plavix for now    Prostate cancer [facility]) with 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 Hazeline Junker assistance  - pt was on neulasta and steroids     Hyponatremia - appears to be pre renal - Na stable this AM, encouraged oral intake     Hypokalemia - supplement and repeat BMP in AM    Chronic diastolic CHF (congestive heart failure), NYHA class 1 ([place]) - last 2 D ECHO done in Oct 2016, no need for repeat 2 D ECHO - grade I diastolic CHF noted on last 2 D ECHO - monitor daily weights, strict I/O  DVT prophylaxis - SCD's  Code Status: Full.  Family Communication:  plan of care discussed with the patient Disposition Plan: Home when sepsis resolved, possibly 11/27  IV access:  Peripheral IV  Procedures and diagnostic studies:    Dg Chest 2 View 07/12/2015 Right lower lobe infiltrate consistent with pneumonia. Lungs elsewhere clear. Areas of sclerotic bony metastatic disease.   Ct Angio Chest Pe W/cm &/  or Wo Cm 07/12/2015 There is no evidence of pulmonary embolus. Multiple sclerotic lesions are noted in the thoracic spine consistent with osseous metastases. Multiple ill-defined airspace opacities are noted in the right middle and lower lobes consistent with multifocal pneumonia. There is also noted fluid or debris in the lower lobe bronchi bilaterally, but most prominently on the right. This is concerning for mucous plugging or aspiration.   Medical Consultants:  Oncology   Other Consultants:  PT Diabetic educator    IAnti-Infectives:   Vancomycin and Zosyn 11/23 --> transitioned to oral Levaquin 11/26 -->  [name], MD  [facility] Pager [phone]  If 7PM-7AM, please contact night-coverage [email or web address] Password [facility] 07/16/2015, 5:57 PM   LOS: 4 days   HPI/Subjective: No events overnight. Constipation.   Objective: Filed Vitals:   07/15/15 2354 07/16/15 0215 07/16/15 0439 07/16/15 1110  BP:  164/70 172/75 163/75  Pulse: 116 118 119 112  Temp: 99.6 F (37.6 C) 98.7 F (37.1 C) 98.5 F (36.9 C) 98.3 F (36.8 C)  TempSrc: Oral Oral Oral Oral  Resp: '20 18 18 18  ' Height:      Weight:   59.33 kg (130 lb 12.8 oz)   SpO2: 98% 99% 100% 99%    Intake/Output Summary (Last 24 hours) at 07/16/15 1757 Last data filed at 07/16/15 0500  Gross per 24 hour  Intake   1840 ml  Output   1250 ml  Net    590 ml    Exam:   General:  Pt is alert, follows commands appropriately, not in acute distress  Cardiovascular: Regular rhythm, tachycardic, S1/S2, no murmurs, no rubs, no gallops  Respiratory: Clear to auscultation bilaterally, no wheezing, rhonchi at bases better  Abdomen: Soft, non tender, non distended, bowel sounds present, no guarding  Extremities: bilateral thigh edema and TTP  Data Reviewed: Basic Metabolic Panel:  Recent Labs Lab 07/12/15 1600 07/13/15 1000 07/14/15 0655 07/15/15 0432 07/16/15 0258  NA 133* 133* 134* 131* 132*  K 3.9 3.4* 3.7 3.2* 3.6  CL 97* 99* 99* 95* 95*  CO2 '24 22 27 26 27  ' GLUCOSE 343* 221* 143* 210* 209*  BUN '10 8 6 7 6  ' CREATININE 0.51* 0.48* 0.35* 0.41* 0.44*  CALCIUM 9.0 9.4 9.3 8.7* 9.0  MG  --  1.8  --   --   --   PHOS  --  2.4*  --   --   --    Liver Function Tests:  Recent Labs Lab 07/09/15 2008 07/11/15 1110 07/13/15 1000  AST 20 27 34  ALT 15* 19 23  ALKPHOS 446* 432* 352*  BILITOT 0.5 0.7 0.8  PROT 6.3* 6.3* 6.4*  ALBUMIN 3.3* 3.3* 3.2*   CBC:  Recent Labs Lab 07/09/15 2008 07/11/15 1110 07/12/15 1600  07/14/15 0435 07/15/15 0432 07/16/15 0258  WBC 32.1* 27.0* 30.8* 23.0* 21.9* 20.0*  NEUTROABS 28.9* 24.5* 29.0*  --   --   --   HGB 7.4* 7.6* 7.3* 10.2* 9.3* 9.4*  HCT 22.7* 23.3* 22.0* 29.4* 28.4* 28.3*  MCV 81.1 82.3 81.5 79.7 79.8 81.1  PLT 108* 85* 121* 242 198 232   Cardiac Enzymes:  Recent Labs Lab 07/09/15 2008 07/12/15 2310 07/13/15 0959 07/13/15 1545  CKTOTAL 558*  --   --   --   TROPONINI  --  0.03 0.03 <0.03   CBG:  Recent Labs Lab 07/15/15 1734 07/15/15 2140 07/16/15 0751 07/16/15 1308 07/16/15 1752  GLUCAP 324* 228* 202* 141* 193*  Recent Results (from the past 240 hour(s))  Culture, blood (routine x 2)     Status: None (Preliminary result)   Collection Time: 07/12/15  4:00 PM  Result Value Ref Range Status   Specimen Description BLOOD RIGHT CHEST  Final   Special Requests BOTTLES DRAWN AEROBIC AND ANAEROBIC 5CC EACH  Final   Culture   Final    NO GROWTH 4 DAYS Performed at [facility]    Report Status PENDING  Incomplete  Culture, blood (routine x 2)     Status: None (Preliminary result)   Collection Time: 07/12/15 11:10 PM  Result Value Ref Range Status   Specimen Description BLOOD LEFT ANTECUBITAL  Final   Special Requests BOTTLES DRAWN AEROBIC ONLY 6 ML  Final   Culture   Final    NO GROWTH 3 DAYS Performed at [facility]    Report Status PENDING  Incomplete  Culture, sputum-assessment     Status: None   Collection Time: 07/13/15  8:14 AM  Result Value Ref Range Status   Specimen Description SPUTUM  Final   Special Requests NONE  Final   Sputum evaluation   Final    THIS SPECIMEN IS ACCEPTABLE. RESPIRATORY CULTURE REPORT TO FOLLOW.   Report Status 07/13/2015 FINAL  Final  Culture, respiratory (NON-Expectorated)     Status: None   Collection Time: 07/13/15  8:14 AM  Result Value Ref Range Status   Specimen Description SPUTUM  Final   Special Requests NONE  Final   Gram Stain   Final    ABUNDANT WBC PRESENT,BOTH PMN  AND MONONUCLEAR RARE SQUAMOUS EPITHELIAL CELLS PRESENT FEW GRAM POSITIVE COCCI IN PAIRS RARE GRAM NEGATIVE RODS Performed at [facility]    Culture   Final    MODERATE STREPTOCOCCUS,BETA HEMOLYTIC NOT GROUP A Performed at [facility]    Report Status 07/16/2015 FINAL  Final    Scheduled Meds: . aspirin  81 mg Oral Daily  . atorvastatin  40 mg Oral QHS  . clopidogrel  75 mg Oral Q breakfast  . glipiZIDE  10 mg Oral QAC breakfast  . insulin aspart  0-5 Units Subcutaneous QHS  . insulin aspart  0-9 Units Subcutaneous TID WC  . losartan  50 mg Oral Daily  . ZOSYN  IV  3.375 g Intravenous 3 times per day  . vancomycin  750 mg Intravenous BID

## 2015-07-16 NOTE — Progress Notes (Addendum)
Continues to have issues with pain.  Would he be a candidate for fentanyl patch?  Or increase in frequency of dose.  Currently alternating dilaudid q4 and oxycodone q4.   SBP remians 160 -180's doctors PN states there is an order for apresoline PRN but there is not a current order.

## 2015-07-17 ENCOUNTER — Inpatient Hospital Stay (HOSPITAL_COMMUNITY): Payer: PPO

## 2015-07-17 ENCOUNTER — Encounter (HOSPITAL_COMMUNITY): Payer: Self-pay | Admitting: Radiology

## 2015-07-17 DIAGNOSIS — G893 Neoplasm related pain (acute) (chronic): Secondary | ICD-10-CM

## 2015-07-17 DIAGNOSIS — Z515 Encounter for palliative care: Secondary | ICD-10-CM

## 2015-07-17 LAB — GLUCOSE, CAPILLARY
GLUCOSE-CAPILLARY: 128 mg/dL — AB (ref 65–99)
GLUCOSE-CAPILLARY: 156 mg/dL — AB (ref 65–99)
Glucose-Capillary: 207 mg/dL — ABNORMAL HIGH (ref 65–99)
Glucose-Capillary: 165 mg/dL — ABNORMAL HIGH (ref 65–99)

## 2015-07-17 LAB — CULTURE, BLOOD (ROUTINE X 2): Culture: NO GROWTH

## 2015-07-17 LAB — BASIC METABOLIC PANEL
ANION GAP: 10 (ref 5–15)
BUN: 7 mg/dL (ref 6–20)
CO2: 27 mmol/L (ref 22–32)
Calcium: 8.7 mg/dL — ABNORMAL LOW (ref 8.9–10.3)
Chloride: 92 mmol/L — ABNORMAL LOW (ref 101–111)
Creatinine, Ser: 0.47 mg/dL — ABNORMAL LOW (ref 0.61–1.24)
GLUCOSE: 228 mg/dL — AB (ref 65–99)
POTASSIUM: 3.5 mmol/L (ref 3.5–5.1)
Sodium: 129 mmol/L — ABNORMAL LOW (ref 135–145)

## 2015-07-17 LAB — CBC
HEMATOCRIT: 26.7 % — AB (ref 39.0–52.0)
Hemoglobin: 8.9 g/dL — ABNORMAL LOW (ref 13.0–17.0)
MCH: 27.1 pg (ref 26.0–34.0)
MCHC: 33.3 g/dL (ref 30.0–36.0)
MCV: 81.2 fL (ref 78.0–100.0)
Platelets: 277 10*3/uL (ref 150–400)
RBC: 3.29 MIL/uL — AB (ref 4.22–5.81)
RDW: 16.4 % — ABNORMAL HIGH (ref 11.5–15.5)
WBC: 20.1 10*3/uL — AB (ref 4.0–10.5)

## 2015-07-17 LAB — OCCULT BLOOD X 1 CARD TO LAB, STOOL: FECAL OCCULT BLD: NEGATIVE

## 2015-07-17 MED ORDER — DEXAMETHASONE SODIUM PHOSPHATE 4 MG/ML IJ SOLN
8.0000 mg | Freq: Two times a day (BID) | INTRAMUSCULAR | Status: DC
  Administered 2015-07-17 (×2): 8 mg via INTRAVENOUS
  Filled 2015-07-17 (×2): qty 2

## 2015-07-17 MED ORDER — DEXAMETHASONE SODIUM PHOSPHATE 4 MG/ML IJ SOLN: 4.0000 mg | Freq: Two times a day (BID) | INTRAMUSCULAR | Status: DC

## 2015-07-17 MED ORDER — HYDROMORPHONE HCL 1 MG/ML IJ SOLN
0.5000 mg | INTRAMUSCULAR | Status: DC | PRN
  Administered 2015-07-17: 1 mg via INTRAVENOUS
  Filled 2015-07-17: qty 1

## 2015-07-17 MED ORDER — OXYCODONE HCL ER 15 MG PO T12A
15.0000 mg | EXTENDED_RELEASE_TABLET | Freq: Two times a day (BID) | ORAL | Status: DC
  Administered 2015-07-17 – 2015-07-19 (×5): 15 mg via ORAL
  Filled 2015-07-17 (×5): qty 1

## 2015-07-17 MED ORDER — DEXAMETHASONE SODIUM PHOSPHATE 4 MG/ML IJ SOLN: 8.0000 mg | Freq: Once | INTRAMUSCULAR | Status: DC

## 2015-07-17 MED ORDER — TRAZODONE HCL 50 MG PO TABS: 25.0000 mg | ORAL_TABLET | Freq: Every evening | ORAL | Status: DC | PRN

## 2015-07-17 NOTE — Progress Notes (Signed)
Spoke with pt concerning disposition. Pt selected to go to SNF.

## 2015-07-17 NOTE — Progress Notes (Addendum)
Palliative:  Full note to follow. Ricky Terry is miserable when I see him just rolling from side to side in pain our entire conversation. He is unable to eat or sleep with this pain. He is frustrated and tired. We discussed his worsening cancer and he is disappointed that he has not had the results he expected from his treatment. He tells me that he knows that his time may be getting short but he is not ready to die. We agree to focus on working on better managing his pain and sleep in hopes that he can continue therapy to get stronger and chemotherapy eventually. Will be able to better address goals of care once he is at least a little more comfortable.   - Decadron 8 mg IV every 12 hours and will try and decrease tomorrow. Will likely need increased glucose control but will monitor as I do not want to make him hypoglycemic.  - Oxycontin 15 mg every 12 hours (am hoping he will have some relief from steroids as well). Will reassess in the am for efficacy and to decrease prn pain medication or increase of Oxycontin as needed. - Trazodone 25 mg qhs prn sleep.  - Will call and touch base with Dr. Alen Blew - Will likely need radiation onc consult (f/u after CT) and possibly biphosphonates - will proceed with these tomorrow if appropriate.  Vinie Sill, NP Palliative Medicine Team Pager # [phone] (M-F 8a-5p) Team Phone # [phone] (Nights/Weekends)

## 2015-07-17 NOTE — Progress Notes (Signed)
Patient ID: Ricky Terry, male   DOB: 01-08-1946, 69 y.o.   MRN: [id number]  [facility] PROGRESS NOTE  GIBRIL MASTRO LKG:[id number] DOB: September 14, 1945 DOA: 07/12/2015 PCP: [name], MD   Brief narrative:    69 y.o. 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 Pt [facility] ED with main concern of several days duration of progressive dyspnea mostly with exertion but occasionally present at rest, associated with productive cough of greenish sputum, fevers, chills, malaise.  In ED, CTA of the chest notable for multiple sclerotic lesion thoracic spine and multiple airspace opacities in the right middle lower lobes consistent multifocal pneumonia. [facility] asked admit pt for further evaluation.   Assessment/Plan:    Principal Problem:   Acute respiratory failure with hypoxia (HCC) - secondary to right middle and lower lobe PNA and ? Aspiration PNA, strep non group A - completed 4 days of vanc and zosyn, Levaquin day #3, stop after tomorrow's dose  - BC negative to date  - sputum culture with strep non group A   Active Problems:   Bilateral thigh pain - likely from metastatic disease - CT of bilateral femurs requested for evaluation  - provide analgesia as needed    Protein-calorie malnutrition, severe - nutritionist consulted - pt tolerating diet well    Opioid induced constipation  - placed on Miralax, senokot - last BMP was 48 hours ago     Sepsis due to mulitfocal pneumonia ([place]) - please note that pt met criteria for sepsis on admission with T 100.2 F, HR > 90, RR > 22, WBC 30 K - ABX as noted above  - appears to be responding to current regimen - blood cultures NGTD    Tachycardia - this is reactive and secondary to sepsis and PNA outlined above - add metoprolol as needed     Accelerated  hypertension - currently on losartan and will continue same regimen  - added hydralazine as needed for better BP control    DM type 2, uncontrolled, with neuropathy ([place]) - on metformin and glipizide at home - continue holding both medications for now  - A1C 10/25 was > 14, continue with Lantus 5 U and will continue SSI - diabetic educator consulted and we certainly appreciate recommendations     Thrombocytopenia ([place]) - no signs of bleeding, resolved  - CBC in AM    Anemia of malignancy - anemia panel pending  - pt transfused two units of PRBC, Hg up to 10.2 this AM - continue to hold Plavix for now    Prostate cancer [facility]) with 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 Hazeline Junker assistance  - pt was on neulasta and steroids     Hyponatremia - appears to be pre renal - monitor     Hypokalemia - supplement and repeat BMP in AM    Chronic diastolic CHF (congestive heart failure), NYHA class 1 ([place]) - last 2 D ECHO done in Oct 2016, no need for repeat 2 D ECHO - grade I diastolic CHF noted on last 2 D ECHO - weight remains 126 - 129 lbs  - monitor daily weights, strict I/O  DVT prophylaxis - SCD's  Code Status: Full.  Family Communication:  plan of care discussed with the patient Disposition Plan: Home when sepsis resolved, possibly 11/27  IV access:  Peripheral IV  Procedures  and diagnostic studies:    Dg Chest 2 View 07/12/2015 Right lower lobe infiltrate consistent with pneumonia. Lungs elsewhere clear. Areas of sclerotic bony metastatic disease.   Ct Angio Chest Pe W/cm &/or Wo Cm 07/12/2015 There is no evidence of pulmonary embolus. Multiple sclerotic lesions are noted in the thoracic spine consistent with osseous metastases. Multiple ill-defined airspace opacities are noted in the right middle and lower lobes consistent with multifocal pneumonia. There is also noted fluid or debris in the lower lobe bronchi bilaterally, but most prominently on the  right. This is concerning for mucous plugging or aspiration.   Medical Consultants:  Oncology   Other Consultants:  PT Diabetic educator   IAnti-Infectives:   Vancomycin and Zosyn 11/23 --> transitioned to oral Levaquin 11/26 -->  [name], MD  [facility] Pager [phone]  If 7PM-7AM, please contact night-coverage [email or web address] Password [facility] 07/17/2015, 12:39 PM   LOS: 5 days   HPI/Subjective: No events overnight. Constipation. Still with significant bilateral thigh pain.   Objective: Filed Vitals:   07/16/15 1110 07/16/15 1251 07/16/15 2200 07/17/15 0639  BP: 163/75 141/72 160/76 148/62  Pulse: 112 109 122 122  Temp: 98.3 F (36.8 C)  98.5 F (36.9 C) 98.3 F (36.8 C)  TempSrc: Oral  Oral Oral  Resp: _0 Height:      Weight:    58.8 kg (129 lb 10.1 oz)  SpO2: 99%  100% 100%    Intake/Output Summary (Last 24 hours) at 07/17/15 1239 Last data filed at 07/17/15 0645  Gross per 24 hour  Intake    960 ml  Output   2350 ml  Net  -1390 ml    Exam:   General:  Pt is alert, follows commands appropriately, not in acute distress  Cardiovascular: Regular rhythm, tachycardic, S1/S2, no murmurs, no rubs, no gallops  Respiratory: Clear to auscultation bilaterally, no wheezing, rhonchi at bases better  Abdomen: Soft, non tender, non distended, bowel sounds present, no guarding  Extremities: bilateral thigh edema and TTP  Data Reviewed: Basic Metabolic Panel:  Recent Labs Lab 07/13/15 1000 07/14/15 0655 07/15/15 0432 07/16/15 0258 07/17/15 0305  NA 133* 134* 131* 132* 129*  K 3.4* 3.7 3.2* 3.6 3.5  CL 99* 99* 95* 95* 92*  CO2 _1 GLUCOSE 221* 143* 210* 209* 228*  BUN _2 CREATININE 0.48* 0.35* 0.41* 0.44* 0.47*  CALCIUM 9.4 9.3 8.7* 9.0 8.7*  MG 1.8  --   --   --   --   PHOS 2.4*  --   --   --   --    Liver Function Tests:  Recent Labs Lab 07/11/15 1110 07/13/15 1000  AST 27 34  ALT 19 23  ALKPHOS 432* 352*   BILITOT 0.7 0.8  PROT 6.3* 6.4*  ALBUMIN 3.3* 3.2*   CBC:  Recent Labs Lab 07/11/15 1110 07/12/15 1600 07/14/15 0435 07/15/15 0432 07/16/15 0258 07/17/15 0305  WBC 27.0* 30.8* 23.0* 21.9* 20.0* 20.1*  NEUTROABS 24.5* 29.0*  --   --   --   --   HGB 7.6* 7.3* 10.2* 9.3* 9.4* 8.9*  HCT 23.3* 22.0* 29.4* 28.4* 28.3* 26.7*  MCV 82.3 81.5 79.7 79.8 81.1 81.2  PLT 85* 121* 242 198 232 277   Cardiac Enzymes:  Recent Labs Lab 07/12/15 2310 07/13/15 0959 07/13/15 1545  TROPONINI 0.03 0.03 <0.03   CBG:  Recent Labs Lab 07/16/15 1308 07/16/15 1752 07/16/15 2148  07/17/15 0746 07/17/15 1222  GLUCAP 141* 193* 268* 207* 128*   Recent Results (from the past 240 hour(s))  Culture, blood (routine x 2)     Status: None (Preliminary result)   Collection Time: 07/12/15  4:00 PM  Result Value Ref Range Status   Specimen Description BLOOD RIGHT CHEST  Final   Special Requests BOTTLES DRAWN AEROBIC AND ANAEROBIC 5CC EACH  Final   Culture   Final    NO GROWTH 4 DAYS Performed at [facility]    Report Status PENDING  Incomplete  Culture, blood (routine x 2)     Status: None (Preliminary result)   Collection Time: 07/12/15 11:10 PM  Result Value Ref Range Status   Specimen Description BLOOD LEFT ANTECUBITAL  Final   Special Requests BOTTLES DRAWN AEROBIC ONLY 6 ML  Final   Culture   Final    NO GROWTH 3 DAYS Performed at [facility]    Report Status PENDING  Incomplete  Culture, sputum-assessment     Status: None   Collection Time: 07/13/15  8:14 AM  Result Value Ref Range Status   Specimen Description SPUTUM  Final   Special Requests NONE  Final   Sputum evaluation   Final    THIS SPECIMEN IS ACCEPTABLE. RESPIRATORY CULTURE REPORT TO FOLLOW.   Report Status 07/13/2015 FINAL  Final  Culture, respiratory (NON-Expectorated)     Status: None   Collection Time: 07/13/15  8:14 AM  Result Value Ref Range Status   Specimen Description SPUTUM  Final   Special  Requests NONE  Final   Gram Stain   Final    ABUNDANT WBC PRESENT,BOTH PMN AND MONONUCLEAR RARE SQUAMOUS EPITHELIAL CELLS PRESENT FEW GRAM POSITIVE COCCI IN PAIRS RARE GRAM NEGATIVE RODS Performed at [facility]    Culture   Final    MODERATE STREPTOCOCCUS,BETA HEMOLYTIC NOT GROUP A Performed at [facility]    Report Status 07/16/2015 FINAL  Final    Scheduled meds: . aspirin  81 mg Oral Daily  . atorvastatin  40 mg Oral QHS  . bisacodyl  10 mg Rectal Daily  . feeding supplement (ENSURE ENLIVE)  237 mL Oral BID BM  . insulin aspart  0-5 Units Subcutaneous QHS  . insulin aspart  0-9 Units Subcutaneous TID WC  . insulin glargine  5 Units Subcutaneous QHS  . levofloxacin  500 mg Oral Daily  . losartan  50 mg Oral Daily  . polyethylene glycol  17 g Oral BID  . senna-docusate  1 tablet Oral BID

## 2015-07-17 NOTE — Consult Note (Signed)
Consultation Note Date: 07/17/2015   Patient Name: Ricky Terry  DOB: [birth year]  MRN: [id number]  Age / Sex: 69 y.o., male  PCP: Ricky Mccreedy, MD Referring Physician: Theodis Blaze, MD  Reason for Consultation: Establishing goals of care and Pain control    Clinical Assessment/Narrative: Ricky Terry is miserable when I see him just rolling from side to side in pain our entire conversation. He is unable to eat or sleep with this pain. He is frustrated and tired. We discussed his worsening cancer and he is disappointed that he has not had the results he expected from his treatment. He tells me that he knows that his time may be getting short but he is not ready to die. We agree to focus on working on better managing his pain and sleep in hopes that he can continue therapy to get stronger and chemotherapy eventually. Will be able to better address goals of care once he is at least a little more comfortable. He is so uncomfortable at this time it is difficult for him to focus, work with PT, or even eat. Discussed a plan with Ricky Terry for his pain control and sleep management and his agrees that this will be a good start and we will reassess tomorrow. Discussed plan with Ricky Terry. Will follow up tomorrow and hopefully have pain better managed so we can further discuss GOC. Emotional support provided.    Contacts/Participants in Discussion: Primary Decision Maker: Self     SUMMARY OF RECOMMENDATIONS - Decadron 8 mg IV every 12 hours and will try and decrease tomorrow. Will likely need increased glucose control but will monitor as I do not want to make him hypoglycemic.  - Oxycontin 15 mg every 12 hours (am hoping he will have some relief from steroids as well). Will reassess in the am for efficacy and to decrease prn pain medication or increase of Oxycontin as needed. - Trazodone 25 mg qhs prn sleep.  - Will  call and touch base with Ricky Terry tomorrow - Will likely need radiation onc consult (f/u after CT) and possibly biphosphonates - will proceed with these tomorrow if appropriate.  Code Status/Advance Care Planning: Full code - did not discuss today    Code Status Orders        Start     Ordered   07/12/15 2227  Full code   Continuous     07/12/15 2226       Symptom Management:   As above in recommendations.   Palliative Prophylaxis:   Bowel Regimen, Delirium Protocol and Frequent Pain Assessment  Additional Recommendations (Limitations, Scope, Preferences):  Full Scope Treatment  Psycho-social/Spiritual:  Support System: Poor Desire for further Chaplaincy support:yes Additional Recommendations: Caregiving  Support/Resources and Grief/Bereavement Support  Prognosis: Likely < 6 months  Discharge Planning: [place] for rehab with Palliative care service follow-up   Chief Complaint/ Primary Diagnoses: Present on Admission:  . (Resolved) Symptomatic anemia . Protein-calorie malnutrition, severe . Prostate cancer ([place]) . Aspiration pneumonia ([place]) . Tachycardia . Accelerated hypertension . Acute respiratory failure with hypoxia ([place]) . Sepsis due to mulitfocal pneumonia ([place]) . Hyponatremia . Hypokalemia . DM type 2, uncontrolled, with neuropathy ([place]) . Chronic diastolic CHF (congestive heart failure), NYHA class 1 ([place]) . Thrombocytopenia ([place]) . Anemia of chronic disease, prostate cancer  I have reviewed the medical record, interviewed the patient and family, and examined the patient. The following aspects are pertinent.  Past Medical History  Diagnosis Date  .  Stroke ([place])   . Diabetes mellitus   . Hypertension   . Prostate cancer [facility])    Social History   Social History  . Marital Status: Single    Spouse Name: N/A  . Number of Children: N/A  . Years of Education: N/A   Social History Main Topics  . Smoking status: Never  Smoker   . Smokeless tobacco: None  . Alcohol Use: No  . Drug Use: No  . Sexual Activity: Not Asked   Other Topics Concern  . None   Social History Narrative   Family History  Problem Relation Age of Onset  . Cancer Mother   . Hypertension Mother   . Hypertension Father   . Cancer Father   . Diabetes Sister   . Hypertension Sister   . Hypertension Brother   . Diabetes Brother    Scheduled Meds: . aspirin  81 mg Oral Daily  . atorvastatin  40 mg Oral QHS  . bisacodyl  10 mg Rectal Daily  . dexamethasone  8 mg Intravenous Q12H  . feeding supplement (ENSURE ENLIVE)  237 mL Oral BID BM  . insulin aspart  0-5 Units Subcutaneous QHS  . insulin aspart  0-9 Units Subcutaneous TID WC  . insulin glargine  5 Units Subcutaneous QHS  . levofloxacin  500 mg Oral Daily  . losartan  50 mg Oral Daily  . oxyCODONE  15 mg Oral Q12H  . polyethylene glycol  17 g Oral BID  . senna-docusate  1 tablet Oral BID   Continuous Infusions:  PRN Meds:.HYDROmorphone (DILAUDID) injection, oxyCODONE, sodium chloride, traZODone Medications Prior to Admission:  Prior to Admission medications   Medication Sig Start Date End Date Taking? Authorizing Provider  aspirin 81 MG chewable tablet Chew 1 tablet (81 mg total) by mouth daily. 06/14/15  Yes Verlee Monte, MD  atorvastatin (LIPITOR) 40 MG tablet Take 1 tablet (40 mg total) by mouth at bedtime. 06/14/15  Yes Verlee Monte, MD  cholecalciferol (VITAMIN D) 400 UNITS TABS tablet Take 400 Units by mouth daily.   Yes Historical Provider, MD  clopidogrel (PLAVIX) 75 MG tablet Take 1 tablet (75 mg total) by mouth daily with breakfast. 06/14/15  Yes Verlee Monte, MD  glipiZIDE (GLUCOTROL) 10 MG tablet Take 1 tablet (10 mg total) by mouth daily before breakfast. 06/14/15  Yes Verlee Monte, MD  lidocaine-prilocaine (EMLA) cream Apply 1 application topically as needed. 05/23/15  Yes Wyatt Portela, MD  losartan (COZAAR) 50 MG tablet Take 1 tablet (50 mg total) by  mouth daily. 06/14/15  Yes Verlee Monte, MD  metFORMIN (GLUCOPHAGE) 1000 MG tablet Take 1 tablet (1,000 mg total) by mouth 2 (two) times daily. 06/14/15  Yes Verlee Monte, MD  Pegfilgrastim (NEULASTA ) Inject 1 Dose into the skin.   Yes Historical Provider, MD  prochlorperazine (COMPAZINE) 10 MG tablet Take 1 tablet (10 mg total) by mouth every 6 (six) hours as needed for nausea or vomiting. 06/28/15  Yes Wyatt Portela, MD  levofloxacin (LEVAQUIN) 500 MG tablet Take 1 tablet (500 mg total) by mouth daily. 07/16/15   Ricky Blaze, MD  oxyCODONE-acetaminophen (PERCOCET/ROXICET) 5-325 MG tablet Take 1 tablet by mouth every 6 (six) hours as needed for severe pain. 07/16/15   Ricky Blaze, MD  senna-docusate (SENOKOT-S) 8.6-50 MG tablet Take 1 tablet by mouth 2 (two) times daily. 07/16/15   Ricky Blaze, MD   No Known Allergies  Review of Systems  Constitutional: Positive for activity  change and appetite change.  Musculoskeletal:       Severe bilateral thigh pain.   Psychiatric/Behavioral: Positive for sleep disturbance.    Physical Exam  Constitutional: He is oriented to person, place, and time. He appears well-developed. He appears cachectic.  Cardiovascular: Tachycardia present.   Respiratory: Effort normal. No accessory muscle usage. No tachypnea. No respiratory distress.  GI: Normal appearance.  Neurological: He is alert and oriented to person, place, and time.  Psychiatric:  Restless.     Vital Signs: BP 148/62 mmHg  Pulse 122  Temp(Src) 98.3 F (36.8 C) (Oral)  Resp 18  Ht 5\' 6"  (1.676 m)  Wt 58.8 kg (129 lb 10.1 oz)  BMI 20.93 kg/m2  SpO2 100%  SpO2: SpO2: 100 % O2 Device:SpO2: 100 % O2 Flow Rate: .O2 Flow Rate (L/min): 2.5 L/min  IO: Intake/output summary:  Intake/Output Summary (Last 24 hours) at 07/17/15 1430 Last data filed at 07/17/15 0900  Gross per 24 hour  Intake    840 ml  Output   2350 ml  Net  -1510 ml    LBM: Last BM Date: 07/15/15 Baseline  Weight: Weight: 57.1 kg (125 lb 14.1 oz) Most recent weight: Weight: 58.8 kg (129 lb 10.1 oz)      Palliative Assessment/Data:  Flowsheet Rows        Most Recent Value   Intake Tab    Referral Department  Hospitalist   Unit at Time of Referral  Cardiac/Telemetry Unit   Palliative Care Primary Diagnosis  Cancer   Date Notified  07/16/15   Palliative Care Type  New Palliative care   Reason for referral  Clarify Goals of Care   Date of Admission  07/12/15   # of days IP prior to Palliative referral  4   Clinical Assessment    Psychosocial & Spiritual Assessment    Palliative Care Outcomes       Additional Data Reviewed:  CBC:    Component Value Date/Time   WBC 20.1* 07/17/2015 0305   WBC 10.0 06/28/2015 0835   HGB 8.9* 07/17/2015 0305   HGB 9.1* 06/28/2015 0835   HCT 26.7* 07/17/2015 0305   HCT 27.6* 06/28/2015 0835   PLT 277 07/17/2015 0305   PLT 321 06/28/2015 0835   MCV 81.2 07/17/2015 0305   MCV 78.5* 06/28/2015 0835   NEUTROABS 29.0* 07/12/2015 1600   NEUTROABS 8.0* 06/28/2015 0835   LYMPHSABS 0.9 07/12/2015 1600   LYMPHSABS 1.3 06/28/2015 0835   MONOABS 0.9 07/12/2015 1600   MONOABS 0.6 06/28/2015 0835   EOSABS 0.0 07/12/2015 1600   EOSABS 0.0 06/28/2015 0835   BASOSABS 0.0 07/12/2015 1600   BASOSABS 0.1 06/28/2015 0835   Comprehensive Metabolic Panel:    Component Value Date/Time   NA 129* 07/17/2015 0305   NA 139 06/28/2015 0835   K 3.5 07/17/2015 0305   K 4.0 06/28/2015 0835   CL 92* 07/17/2015 0305   CO2 27 07/17/2015 0305   CO2 27 06/28/2015 0835   BUN 7 07/17/2015 0305   BUN 10.6 06/28/2015 0835   CREATININE 0.47* 07/17/2015 0305   CREATININE 0.8 06/28/2015 0835   GLUCOSE 228* 07/17/2015 0305   GLUCOSE 391* 06/28/2015 0835   CALCIUM 8.7* 07/17/2015 0305   CALCIUM 9.9 06/28/2015 0835   AST 34 07/13/2015 1000   AST 12 06/28/2015 0835   ALT 23 07/13/2015 1000   ALT <9 06/28/2015 0835   ALKPHOS 352* 07/13/2015 1000   ALKPHOS 620* 06/28/2015  [id number]  BILITOT 0.8 07/13/2015 1000   BILITOT 0.38 06/28/2015 0835   PROT 6.4* 07/13/2015 1000   PROT 6.4 06/28/2015 0835   ALBUMIN 3.2* 07/13/2015 1000   ALBUMIN 3.1* 06/28/2015 0835     Time In: 1320 Time Out: 1440 Time Total: 16min Greater than 50%  of this time was spent counseling and coordinating care related to the above assessment and plan.  Signed by: Pershing Proud, NP  Pershing Proud, NP  [id number], 2:30 PM  Please contact Palliative Medicine Team phone at [phone] for questions and concerns.

## 2015-07-18 LAB — BASIC METABOLIC PANEL
Anion gap: 11 (ref 5–15)
BUN: 24 mg/dL — AB (ref 6–20)
CALCIUM: 8.4 mg/dL — AB (ref 8.9–10.3)
CHLORIDE: 92 mmol/L — AB (ref 101–111)
CO2: 26 mmol/L (ref 22–32)
CREATININE: 0.73 mg/dL (ref 0.61–1.24)
Glucose, Bld: 480 mg/dL — ABNORMAL HIGH (ref 65–99)
Potassium: 4.3 mmol/L (ref 3.5–5.1)
SODIUM: 129 mmol/L — AB (ref 135–145)

## 2015-07-18 LAB — CULTURE, BLOOD (ROUTINE X 2): CULTURE: NO GROWTH

## 2015-07-18 LAB — GLUCOSE, CAPILLARY
GLUCOSE-CAPILLARY: 424 mg/dL — AB (ref 65–99)
GLUCOSE-CAPILLARY: 479 mg/dL — AB (ref 65–99)
GLUCOSE-CAPILLARY: 350 mg/dL — AB (ref 65–99)
GLUCOSE-CAPILLARY: 225 mg/dL — AB (ref 65–99)

## 2015-07-18 MED ORDER — INSULIN ASPART 100 UNIT/ML ~~LOC~~ SOLN
0.0000 [IU] | Freq: Three times a day (TID) | SUBCUTANEOUS | Status: DC
Start: 2015-07-18 — End: 2015-07-19
  Administered 2015-07-18: 15 [IU] via SUBCUTANEOUS
  Administered 2015-07-18: 20 [IU] via SUBCUTANEOUS
  Administered 2015-07-19: 11 [IU] via SUBCUTANEOUS
  Administered 2015-07-19: 20 [IU] via SUBCUTANEOUS
  Administered 2015-07-19: 4 [IU] via SUBCUTANEOUS

## 2015-07-18 MED ORDER — INSULIN ASPART 100 UNIT/ML ~~LOC~~ SOLN
4.0000 [IU] | Freq: Three times a day (TID) | SUBCUTANEOUS | Status: DC
  Administered 2015-07-18 – 2015-07-19 (×4): 4 [IU] via SUBCUTANEOUS

## 2015-07-18 MED ORDER — OXYCODONE HCL 5 MG PO TABS
5.0000 mg | ORAL_TABLET | ORAL | Status: DC | PRN
  Administered 2015-07-18 – 2015-07-19 (×2): 5 mg via ORAL
  Filled 2015-07-18 (×2): qty 1

## 2015-07-18 MED ORDER — DEXAMETHASONE SODIUM PHOSPHATE 4 MG/ML IJ SOLN
4.0000 mg | Freq: Two times a day (BID) | INTRAMUSCULAR | Status: DC
  Administered 2015-07-18 – 2015-07-19 (×3): 4 mg via INTRAVENOUS
  Filled 2015-07-18 (×3): qty 1

## 2015-07-18 MED ORDER — INSULIN ASPART 100 UNIT/ML ~~LOC~~ SOLN
0.0000 [IU] | Freq: Every day | SUBCUTANEOUS | Status: DC
  Administered 2015-07-18: 2 [IU] via SUBCUTANEOUS

## 2015-07-18 MED ORDER — HYDROMORPHONE HCL 1 MG/ML IJ SOLN
0.5000 mg | INTRAMUSCULAR | Status: DC | PRN
Start: 2015-07-18 — End: 2015-07-19

## 2015-07-18 NOTE — Progress Notes (Signed)
Sherrie NT checked pt CBG and it was 424. Pt told NT that he ate a bunch of graham crackers and peanut butter. MD paged. Dr. Doyle Askew ordered 9 units on insulin. Orders followed. Will continue to monitor.

## 2015-07-18 NOTE — Consult Note (Signed)
   [facility] CM Inpatient Consult   07/18/2015  ARNAB SOLURI [birth year] [id number]   Patient evaluated for [place] Management services. Spoke with inpatient Licensed CSW who indicates patient to go to SNF at discharge. Also noted that patient may have palliative follow up at SNF as well. Will not engage at this time for [facility] services.   Marthenia Rolling, MSN-Ed, RN,BSN [facility] Liaison [phone]

## 2015-07-18 NOTE — Progress Notes (Signed)
Patient ID: Ricky Terry, male   DOB: 1945-10-16, 69 y.o.   MRN: [id number]  [facility] PROGRESS NOTE  Ricky Terry DOB: 09/19/1945 DOA: 07/12/2015 PCP: [name], MD   Brief narrative:    69 y.o. 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 Pt presented to [facility] ED with main concern of several days duration of progressive dyspnea mostly with exertion but occasionally present at rest, associated with productive cough of greenish sputum, fevers, chills, malaise.  In ED, CTA of the chest notable for multiple sclerotic lesion thoracic spine and multiple airspace opacities in the right middle lower lobes consistent multifocal pneumonia. [facility] asked admit pt for further evaluation.   Assessment/Plan:    Principal Problem:   Acute respiratory failure with hypoxia ([place]) - please note that oxygen level dropped to 69 % on 11/24 and have stabilized with O2 via [place]  - secondary to right middle and lower lobe PNA and ? Aspiration PNA, strep non group A - completed 4 days of vanc and zosyn, Levaquin day #4, stop today  - BC negative to date  - sputum culture with strep non group A   Active Problems:   Bilateral thigh pain - likely from metastatic disease - CT of bilateral femurs confirming  - provide analgesia as needed - continue decadron IV for now, appreciate PCT input     Protein-calorie malnutrition, severe - nutritionist consulted - pt tolerating diet well    Opioid induced constipation  - placed on Miralax, senokot - last BMP was 48 hours ago     Sepsis due to mulitfocal pneumonia ([place]) - please note that pt met criteria for sepsis on admission with T 100.2 F, HR > 90, RR > 22, WBC 30 K - ABX as noted above  - appears to be responding to current regimen - blood cultures NGTD   Tachycardia - this is reactive and secondary to sepsis and PNA outlined above - add metoprolol as needed     Accelerated hypertension - currently on losartan and will continue same regimen  - added hydralazine as needed for better BP control    DM type 2, uncontrolled, with neuropathy (H[facility]) - on metformin and glipizide at home - continue holding both medications for now  - A1C 10/25 was > 14, continue with Lantus 5 U and will continue SSI - diabetic educator consulted and we certainly appreciate recommendations     Thrombocytopenia ([place]) - no signs of bleeding, resolved     Anemia of malignancy - anemia panel pending  - pt transfused two units of PRBC, Hg up to 10.2 this AM - continue to hold Plavix for now    Prostate cancer [facility]) with 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 Hazeline Junker assistance  - pt was on neulasta and steroids     Hyponatremia - appears to be pre renal - monitor     Hypokalemia - supplemented    Chronic diastolic CHF (congestive heart failure), NYHA class 1 ([place]) - last 2 D ECHO done in Oct 2016, no need for repeat 2 D ECHO - grade I diastolic CHF noted on last 2 D ECHO - weight remains 126 - 129 lbs  - monitor daily weights, strict I/O  DVT prophylaxis - SCD's  Code Status: Full.  Family Communication:  plan of care discussed with the patient Disposition  Plan: Home when able to ambulate and off Decadron IV  IV access:  Peripheral IV  Procedures and diagnostic studies:    Dg Chest 2 View 07/12/2015 Right lower lobe infiltrate consistent with pneumonia. Lungs elsewhere clear. Areas of sclerotic bony metastatic disease.   Ct Angio Chest Pe W/cm &/or Wo Cm 07/12/2015 There is no evidence of pulmonary embolus. Multiple sclerotic lesions are noted in the thoracic spine consistent with osseous metastases. Multiple ill-defined airspace opacities are noted in the right middle and lower lobes consistent with multifocal pneumonia. There is  also noted fluid or debris in the lower lobe bronchi bilaterally, but most prominently on the right. This is concerning for mucous plugging or aspiration.   Medical Consultants:  Oncology   Other Consultants:  PT Diabetic educator   IAnti-Infectives:   Vancomycin and Zosyn 11/23 --> transitioned to oral Levaquin 11/26 -->  [name], MD  [facility] Pager [phone]  If 7PM-7AM, please contact night-coverage [email or web address] Password [facility] 07/18/2015, 4:44 PM   LOS: 6 days   HPI/Subjective: No events overnight. Constipation. Still with significant bilateral thigh pain.   Objective: Filed Vitals:   07/17/15 2222 07/18/15 0309 07/18/15 0536 07/18/15 1500  BP: 142/59 152/71 144/66 146/70  Pulse: 122 113 109 112  Temp: 97.7 F (36.5 C) 97.8 F (36.6 C) 97.7 F (36.5 C) 97.7 F (36.5 C)  TempSrc: Oral Oral Oral Oral  Resp: _0 Height:      Weight:   59.829 kg (131 lb 14.4 oz)   SpO2: 97% 98% 97% 97%    Intake/Output Summary (Last 24 hours) at 07/18/15 1644 Last data filed at 07/18/15 1500  Gross per 24 hour  Intake    600 ml  Output   1725 ml  Net  -1125 ml    Exam:   General:  Pt is alert, follows commands appropriately, not in acute distress  Cardiovascular: Regular rhythm, tachycardic, S1/S2, no murmurs, no rubs, no gallops  Respiratory: Clear to auscultation bilaterally, no wheezing, rhonchi at bases better  Abdomen: Soft, non tender, non distended, bowel sounds present, no guarding  Extremities: bilateral thigh edema and TTP  Data Reviewed: Basic Metabolic Panel:  Recent Labs Lab 07/13/15 1000 07/14/15 0655 07/15/15 0432 07/16/15 0258 07/17/15 0305 07/18/15 1100  NA 133* 134* 131* 132* 129* 129*  K 3.4* 3.7 3.2* 3.6 3.5 4.3  CL 99* 99* 95* 95* 92* 92*  CO2 _1 GLUCOSE 221* 143* 210* 209* 228* 480*  BUN _2 24*  CREATININE 0.48* 0.35* 0.41* 0.44* 0.47* 0.73  CALCIUM 9.4 9.3 8.7* 9.0 8.7* 8.4*  MG 1.8  --   --    --   --   --   PHOS 2.4*  --   --   --   --   --    Liver Function Tests:  Recent Labs Lab 07/13/15 1000  AST 34  ALT 23  ALKPHOS 352*  BILITOT 0.8  PROT 6.4*  ALBUMIN 3.2*   CBC:  Recent Labs Lab 07/12/15 1600 07/14/15 0435 07/15/15 0432 07/16/15 0258 07/17/15 0305  WBC 30.8* 23.0* 21.9* 20.0* 20.1*  NEUTROABS 29.0*  --   --   --   --   HGB 7.3* 10.2* 9.3* 9.4* 8.9*  HCT 22.0* 29.4* 28.4* 28.3* 26.7*  MCV 81.5 79.7 79.8 81.1 81.2  PLT 121* 242 198 232 277   Cardiac Enzymes:  Recent Labs Lab 07/12/15 2310 07/13/15  9373 07/13/15 1545  TROPONINI 0.03 0.03 <0.03   CBG:  Recent Labs Lab 07/17/15 1222 07/17/15 1644 07/17/15 2054 07/18/15 0732 07/18/15 1152  GLUCAP 128* 156* 165* 424* 479*   Recent Results (from the past 240 hour(s))  Culture, blood (routine x 2)     Status: None   Collection Time: 07/12/15  4:00 PM  Result Value Ref Range Status   Specimen Description BLOOD RIGHT CHEST  Final   Special Requests BOTTLES DRAWN AEROBIC AND ANAEROBIC 5CC EACH  Final   Culture   Final    NO GROWTH 5 DAYS Performed at [facility]    Report Status 07/17/2015 FINAL  Final  Culture, blood (routine x 2)     Status: None   Collection Time: 07/12/15 11:10 PM  Result Value Ref Range Status   Specimen Description BLOOD LEFT ANTECUBITAL  Final   Special Requests BOTTLES DRAWN AEROBIC ONLY 6 ML  Final   Culture   Final    NO GROWTH 5 DAYS Performed at [facility]    Report Status 07/18/2015 FINAL  Final  Culture, sputum-assessment     Status: None   Collection Time: 07/13/15  8:14 AM  Result Value Ref Range Status   Specimen Description SPUTUM  Final   Special Requests NONE  Final   Sputum evaluation   Final    THIS SPECIMEN IS ACCEPTABLE. RESPIRATORY CULTURE REPORT TO FOLLOW.   Report Status 07/13/2015 FINAL  Final  Culture, respiratory (NON-Expectorated)     Status: None   Collection Time: 07/13/15  8:14 AM  Result Value Ref Range Status    Specimen Description SPUTUM  Final   Special Requests NONE  Final   Gram Stain   Final    ABUNDANT WBC PRESENT,BOTH PMN AND MONONUCLEAR RARE SQUAMOUS EPITHELIAL CELLS PRESENT FEW GRAM POSITIVE COCCI IN PAIRS RARE GRAM NEGATIVE RODS Performed at [facility]    Culture   Final    MODERATE STREPTOCOCCUS,BETA HEMOLYTIC NOT GROUP A Performed at [facility]    Report Status 07/16/2015 FINAL  Final    Scheduled meds: . aspirin  81 mg Oral Daily  . atorvastatin  40 mg Oral QHS  . bisacodyl  10 mg Rectal Daily  . dexamethasone  4 mg Intravenous Q12H  . feeding supplement (ENSURE ENLIVE)  237 mL Oral BID BM  . insulin aspart  0-20 Units Subcutaneous TID WC  . insulin aspart  0-5 Units Subcutaneous QHS  . insulin aspart  4 Units Subcutaneous TID WC  . insulin glargine  5 Units Subcutaneous QHS  . levofloxacin  500 mg Oral Daily  . losartan  50 mg Oral Daily  . oxyCODONE  15 mg Oral Q12H  . polyethylene glycol  17 g Oral BID  . senna-docusate  1 tablet Oral BID

## 2015-07-18 NOTE — Progress Notes (Signed)
Daily Progress Note   Patient Name: Ricky Terry       Date: 07/18/2015 DOB: [birth year]  Age: 69 y.o. MRN#: [id number] Attending Physician: Theodis Blaze, MD Primary Care Physician: [name], MD Admit Date: 07/12/2015  Reason for Consultation/Follow-up: Establishing goals of care and Pain control  Subjective: Ricky Terry is smiling and laughing while talking on the phone when I enter the room. He then tells me that his pain is much better and even absent much of the time - he proceeds to move both his legs in the bed and is pleased that he can do this without pain. Also says that he slept all night long. Appetite also much improved today. He denies feeling sleepy today. He is concerned about his rising blood sugar and we discussed that this is from the steroids which I have decreased today. Discussed the balance of steroids to manage pain and his managing his glucose levels. He may need increased lantus but will try and titrate steroids as well to the lowest dosage still effective to manage pain.   We also discussed goals of care and he is happy to think that he will be able to better tolerate more treatment and will await to hear more from Dr. Alen Blew for a plan. He did tell me yesterday that he is not ready to die. He desires continued treatment and full code at this time. He talks about his girlfriend and that he has much to live for and loves her very much. Encouraged him to consider advance directives and especially HCPOA since he says that his divorce is not finalized.    Length of Stay: 6 days  Current Medications: Scheduled Meds:  . aspirin  81 mg Oral Daily  . atorvastatin  40 mg Oral QHS  . bisacodyl  10 mg Rectal Daily  . dexamethasone  4 mg Intravenous Q12H  .  feeding supplement (ENSURE ENLIVE)  237 mL Oral BID BM  . insulin aspart  0-20 Units Subcutaneous TID WC  . insulin aspart  0-5 Units Subcutaneous QHS  . insulin aspart  4 Units Subcutaneous TID WC  . insulin glargine  5 Units Subcutaneous QHS  . levofloxacin  500 mg Oral Daily  . losartan  50 mg Oral Daily  . oxyCODONE  15 mg Oral Q12H  .  polyethylene glycol  17 g Oral BID  . senna-docusate  1 tablet Oral BID    Continuous Infusions:    PRN Meds: HYDROmorphone (DILAUDID) injection, oxyCODONE, sodium chloride, traZODone  Physical Exam: Physical Exam  Constitutional: He is oriented to person, place, and time. He appears well-developed. He appears cachectic.  Cardiovascular: Normal rate.   Pulmonary/Chest: Effort normal. No accessory muscle usage. No tachypnea. No respiratory distress.  Abdominal: Normal appearance.  Neurological: He is alert and oriented to person, place, and time.  Psychiatric: He has a normal mood and affect.                Vital Signs: BP 144/66 mmHg  Pulse 109  Temp(Src) 97.7 F (36.5 C) (Oral)  Resp 20  Ht 5\' 6"  (1.676 m)  Wt 59.829 kg (131 lb 14.4 oz)  BMI 21.30 kg/m2  SpO2 97% SpO2: SpO2: 97 % O2 Device: O2 Device: Nasal Cannula O2 Flow Rate: O2 Flow Rate (L/min): 2.5 L/min  Intake/output summary:  Intake/Output Summary (Last 24 hours) at 07/18/15 1301 Last data filed at 07/18/15 0859  Gross per 24 hour  Intake    480 ml  Output   2025 ml  Net  -1545 ml   LBM: Last BM Date: 07/17/15 Baseline Weight: Weight: 57.1 kg (125 lb 14.1 oz) Most recent weight: Weight: 59.829 kg (131 lb 14.4 oz)       Palliative Assessment/Data: Flowsheet Rows        Most Recent Value   Intake Tab    Referral Department  Hospitalist   Unit at Time of Referral  Cardiac/Telemetry Unit   Palliative Care Primary Diagnosis  Cancer   Date Notified  07/16/15   Palliative Care Type  New Palliative care   Reason for referral  Clarify Goals of Care   Date of  Admission  07/12/15   # of days IP prior to Palliative referral  4   Clinical Assessment    Psychosocial & Spiritual Assessment    Palliative Care Outcomes       Additional Data Reviewed: CBC    Component Value Date/Time   WBC 20.1* 07/17/2015 0305   WBC 10.0 06/28/2015 0835   RBC 3.29* 07/17/2015 0305   RBC 4.02* 07/13/2015 0959   RBC 3.51* 06/28/2015 0835   HGB 8.9* 07/17/2015 0305   HGB 9.1* 06/28/2015 0835   HCT 26.7* 07/17/2015 0305   HCT 27.6* 06/28/2015 0835   PLT 277 07/17/2015 0305   PLT 321 06/28/2015 0835   MCV 81.2 07/17/2015 0305   MCV 78.5* 06/28/2015 0835   MCH 27.1 07/17/2015 0305   MCH 26.0* 06/28/2015 0835   MCHC 33.3 07/17/2015 0305   MCHC 33.1 06/28/2015 0835   RDW 16.4* 07/17/2015 0305   RDW 15.9* 06/28/2015 0835   LYMPHSABS 0.9 07/12/2015 1600   LYMPHSABS 1.3 06/28/2015 0835   MONOABS 0.9 07/12/2015 1600   MONOABS 0.6 06/28/2015 0835   EOSABS 0.0 07/12/2015 1600   EOSABS 0.0 06/28/2015 0835   BASOSABS 0.0 07/12/2015 1600   BASOSABS 0.1 06/28/2015 0835    CMP     Component Value Date/Time   NA 129* 07/18/2015 1100   NA 139 06/28/2015 0835   K 4.3 07/18/2015 1100   K 4.0 06/28/2015 0835   CL 92* 07/18/2015 1100   CO2 26 07/18/2015 1100   CO2 27 06/28/2015 0835   GLUCOSE 480* 07/18/2015 1100   GLUCOSE 391* 06/28/2015 0835   BUN 24* 07/18/2015 1100   BUN 10.6  06/28/2015 0835   CREATININE 0.73 07/18/2015 1100   CREATININE 0.8 06/28/2015 0835   CALCIUM 8.4* 07/18/2015 1100   CALCIUM 9.9 06/28/2015 0835   PROT 6.4* 07/13/2015 1000   PROT 6.4 06/28/2015 0835   ALBUMIN 3.2* 07/13/2015 1000   ALBUMIN 3.1* 06/28/2015 0835   AST 34 07/13/2015 1000   AST 12 06/28/2015 0835   ALT 23 07/13/2015 1000   ALT <9 06/28/2015 0835   ALKPHOS 352* 07/13/2015 1000   ALKPHOS 620* 06/28/2015 0835   BILITOT 0.8 07/13/2015 1000   BILITOT 0.38 06/28/2015 0835   GFRNONAA >60 07/18/2015 1100   GFRAA >60 07/18/2015 1100       Problem List:  Patient  Active Problem List   Diagnosis Date Noted  . Palliative care encounter 07/17/2015  . Cancer associated pain 07/17/2015  . Accelerated hypertension 07/13/2015  . Acute respiratory failure with hypoxia ([place]) 07/13/2015  . Sepsis due to mulitfocal pneumonia ([place]) 07/13/2015  . Hyponatremia 07/13/2015  . Hypokalemia 07/13/2015  . DM type 2, uncontrolled, with neuropathy ([place]) 07/13/2015  . Chronic diastolic CHF (congestive heart failure), NYHA class 1 ([place]) 07/13/2015  . Thrombocytopenia ([place]) 07/13/2015  . Anemia of chronic disease, prostate cancer 07/13/2015  . Aspiration pneumonia ([place]) 07/12/2015  . Tachycardia 07/12/2015  . Protein-calorie malnutrition, severe 06/14/2015  . Prostate cancer ([place]) 05/23/2015     Palliative Care Assessment & Plan    1.Code Status:  Full code    Code Status Orders        Start     Ordered   07/12/15 2227  Full code   Continuous     07/12/15 2226       2. Goals of Care/Additional Recommendations:  Full aggressive care desired.   Limitations on Scope of Treatment: Full Scope Treatment  Desire for further Chaplaincy support:yes   3. Symptom Management:      1. Bilateral thigh pain r/t bone mets: Continue Oxycontin 15 mg every 12 hours. Oxycodone 5 mg every 4 hours prn for breakthrough pain. Continue decadron but decreased to 4 mg every 12 hours today (especially with hyperglycemia).        2. Hyperglycemia: Decadron decreased - try and continue to taper. May need increase in lantus.        3. Weakness: Continue PT and proceed with short term rehab to optimize strength. OOB.   4. Palliative Prophylaxis:   Bowel Regimen, Delirium Protocol and Frequent Pain Assessment  5. Prognosis: Likely < 6 months  6. Discharge Planning:  [place] for rehab with Palliative care service follow-up   Thank you for allowing the Palliative Medicine Team to assist in the care of this patient.   Time In: [id number] Time Out: 1325 Total  Time 62min Prolonged Time Billed  no         Pershing Proud, NP  [id number], 1:01 PM  Please contact Palliative Medicine Team phone at [phone] for questions and concerns.

## 2015-07-18 NOTE — Progress Notes (Signed)
CSW continuing to follow.  BSW Intern met with pt at bedside to provide bed offers for SNF. BSW Intern sat with pt and went over bed offers. BSW Intern explained the limited availability due to pt insurance and treatment. Pt expressed interest in [facility] which had not responded via [place] hub. Pt also expressed interest in both [facility] who had offered pt a bed. Pt stated [facility] is his first choice. BSW Intern to follow-up with facilities regarding bed offers.   Pt would like his daughter to be updated on plan. Pt daughter's number not listed in medical records. BSW Intern called pt brother to inquire about pt daughter's number. Pt brother to call back with pt daughter's number.   CSW contacted [facility] and facility is reviewing if they would be able to provide transportation to pt's cancer treatments.   CSW to continue to follow and provide support.

## 2015-07-18 NOTE — Progress Notes (Signed)
Inpatient Diabetes Program Recommendations  AACE/ADA: New Consensus Statement on Inpatient Glycemic Control (2015)  Target Ranges:  Prepandial:   less than 140 mg/dL      Peak postprandial:   less than 180 mg/dL (1-2 hours)      Critically ill patients:  140 - 180 mg/dL   Review of Glycemic Control  Results for Ricky Terry, Ricky Terry (MRN [id number]) as of 07/18/2015 13:42  Ref. Range 07/17/2015 16:44 07/17/2015 20:54 07/18/2015 07:32 07/18/2015 11:52  Glucose-Capillary Latest Ref Range: 65-99 mg/dL 156 (H) 165 (H) 424 (H) 479 (H)   Hyperglycemia in the presence of steroids. May be beneficial to increase Lantus.  Inpatient Diabetes Program Recommendations:     Consider increasing Novolog meal coverage insulin to 6 units tidwc if pt eats > 50% meal. Increase Lantus to 8 units QHS.  Will continue to follow. Thank you. Lorenda Peck, RD, LDN, CDE Inpatient Diabetes Coordinator [phone]

## 2015-07-18 NOTE — Progress Notes (Signed)
Physical Therapy Treatment Patient Details Name: Ricky Terry MRN: [id number] DOB: 1945/10/08 Today's Date: 07/18/2015    History of Present Illness 69 y.o. 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    PT Comments    Pain level MUCH improved Pt was able to perform side to side bed mobility and transfer to EOB with yelling in pain.  Assisted NT with bath then had pt perform lateral scooting to recliner.  Strong upper body use to perform.  Pt unable to flex B knees past 45 degrees in fear of initiating pain.  NT instructed to have pt scoot back to bed same fashion.    Follow Up Recommendations  SNF     Equipment Recommendations       Recommendations for Other Services       Precautions / Restrictions Precautions Precautions: Fall Precaution Comments: bone mets/pain Restrictions Weight Bearing Restrictions: No    Mobility  Bed Mobility Overal bed mobility: Needs Assistance Bed Mobility: Rolling;Supine to Sit Rolling: Min guard;Min assist   Supine to sit: Min guard;Min assist     General bed mobility comments: only required assist with B LE's to complete side to side rolling.  Used B rails for upper body at [facility].   Transfers Overall transfer level: Needs assistance Equipment used: None Transfers: Lateral/Scoot Transfers          Lateral/Scoot Transfers: Min assist General transfer comment: positioned recliner tight to bed with L arm rest in down position.  Pt was able to scoot self from bed to same level recliner with min assist to pull bed pad along.  Pt used much upper body to semi lift buttocks and scoot laterally.  B LE's placed on floor for support with knees approx 45 drees.  No B LE assist was noted.  All upper body.   Ambulation/Gait                 Stairs            Wheelchair Mobility    Modified Rankin (Stroke Patients Only)       Balance                                    Cognition Arousal/Alertness: Awake/alert Behavior During Therapy: WFL for tasks assessed/performed Overall Cognitive Status: Within Functional Limits for tasks assessed                      Exercises      General Comments        Pertinent Vitals/Pain Pain Assessment: Faces Faces Pain Scale: Hurts a little bit Pain Location: Bilateral upper anterior thighs Pain Descriptors / Indicators: Constant Pain Intervention(s): Monitored during session;Repositioned    Home Living                      Prior Function            PT Goals (current goals can now be found in the care plan section) Progress towards PT goals: Progressing toward goals    Frequency  Min 3X/week    PT Plan      Co-evaluation             End of Session Equipment Utilized During Treatment: Oxygen Activity  Tolerance: Treatment limited secondary to medical complications (Comment) Patient left: in chair;with chair alarm set;with family/visitor present     Time: [id number] PT Time Calculation (min) (ACUTE ONLY): 26 min  Charges:  $Therapeutic Activity: 23-37 mins                    G Codes:      Ricky Terry  PTA [facility]  Acute  Rehab Pager      [phone]

## 2015-07-18 NOTE — Care Management Note (Signed)
Case Management Note  Patient Details  Name: VENUS MORIARITY MRN: [id number] Date of Birth: 1945-12-02  Subjective/Objective:    Admitted with resp failure                Action/Plan: Plan to dc to SNF   Expected Discharge Date:   (UNKNOWN)               Expected Discharge Plan:  Skilled Nursing Facility  In-House Referral:  Clinical Social Work  Discharge planning Services  CM Consult  Post Acute Care Choice:    Choice offered to:     DME Arranged:    DME Agency:     HH Arranged:    [place] Agency:     Status of Service:  Completed, signed off  Medicare Important Message Given:  Yes Date Medicare IM Given:    Medicare IM give by:    Date Additional Medicare IM Given:    Additional Medicare Important Message give by:     If discussed at [place] of Stay Meetings, dates discussed:    Additional CommentsPurcell Mouton, RN 07/18/2015, 4:12 PM

## 2015-07-18 NOTE — Clinical Documentation Improvement (Addendum)
Hospitalist  (Please document query responses in the progress notes and discharge summary.  Responses documented on the CDI BPA are not codeable because the CDI BPA is not a part of the permanent medical record.)  Please provide signs and symptoms, vital signs, O2 sats, and/or baseline/historical data to support your documented diagnosis of "acute respiratory failure with hypoxia" dated 07/13/15 by Dr. Doyle Askew.   - "Acute respiratory failure with hypoxia" DOES exist and additional clinical information is documented in the medical record.   - "Acute respiratory failure with hypoxia" DOES NOT exist and amended documentation is provided in the medical record     Please exercise your independent, professional judgment when responding. A specific answer is not anticipated or expected.  Thank You,  Erling Conte  RN BSN CCDS [phone] Health Information Management [place]

## 2015-07-19 ENCOUNTER — Ambulatory Visit: Payer: PPO | Admitting: Oncology

## 2015-07-19 ENCOUNTER — Other Ambulatory Visit: Payer: PPO

## 2015-07-19 ENCOUNTER — Ambulatory Visit: Payer: PPO

## 2015-07-19 LAB — GLUCOSE, CAPILLARY
GLUCOSE-CAPILLARY: 361 mg/dL — AB (ref 65–99)
GLUCOSE-CAPILLARY: 267 mg/dL — AB (ref 65–99)
Glucose-Capillary: 156 mg/dL — ABNORMAL HIGH (ref 65–99)

## 2015-07-19 MED ORDER — INSULIN GLARGINE 100 UNIT/ML ~~LOC~~ SOLN: 5.0000 [IU] | Freq: Every day | SUBCUTANEOUS | Status: DC

## 2015-07-19 MED ORDER — INSULIN ASPART 100 UNIT/ML ~~LOC~~ SOLN: 0.0000 [IU] | Freq: Three times a day (TID) | SUBCUTANEOUS | Status: AC

## 2015-07-19 MED ORDER — HEPARIN SOD (PORK) LOCK FLUSH 100 UNIT/ML IV SOLN
500.0000 [IU] | INTRAVENOUS | Status: AC | PRN
  Administered 2015-07-19: 500 [IU]

## 2015-07-19 MED ORDER — DEXAMETHASONE 4 MG PO TABS
4.0000 mg | ORAL_TABLET | Freq: Three times a day (TID) | ORAL | Status: AC
Start: 2015-07-19 — End: ?

## 2015-07-19 MED ORDER — OXYCODONE HCL ER 15 MG PO T12A: 15.0000 mg | EXTENDED_RELEASE_TABLET | Freq: Two times a day (BID) | ORAL | Status: DC

## 2015-07-19 MED ORDER — DEXAMETHASONE 4 MG PO TABS
4.0000 mg | ORAL_TABLET | Freq: Three times a day (TID) | ORAL | Status: DC
  Administered 2015-07-19: 4 mg via ORAL
  Filled 2015-07-19 (×3): qty 1

## 2015-07-19 MED ORDER — OXYCODONE HCL 5 MG PO TABS: 5.0000 mg | ORAL_TABLET | ORAL | Status: DC | PRN

## 2015-07-19 MED ORDER — TRAZODONE HCL 50 MG PO TABS: 25.0000 mg | ORAL_TABLET | Freq: Every evening | ORAL | Status: DC | PRN

## 2015-07-19 MED ORDER — BISACODYL 10 MG RE SUPP: 10.0000 mg | Freq: Every day | RECTAL | Status: AC

## 2015-07-19 NOTE — Clinical Social Work Placement (Signed)
   CLINICAL SOCIAL WORK PLACEMENT  NOTE  Date:  07/19/2015  Patient Details  Name: Ricky Terry MRN: [id number] Date of Birth: 09/27/1945  Clinical Social Work is seeking post-discharge placement for this patient at the [place] level of care (*CSW will initial, date and re-position this form in  chart as items are completed):  Yes   Patient/family provided with [place] Work Department's list of facilities offering this level of care within the geographic area requested by the patient (or if unable, by the patient's family).  Yes   Patient/family informed of their freedom to choose among providers that offer the needed level of care, that participate in Medicare, Medicaid or managed care program needed by the patient, have an available bed and are willing to accept the patient.  Yes   Patient/family informed of [place]'s ownership interest in [facility] and [facility], as well as of the fact that they are under no obligation to receive care at these facilities.  PASRR submitted to EDS on 07/15/15     PASRR number received on 07/15/15     Existing PASRR number confirmed on       FL2 transmitted to all facilities in geographic area requested by pt/family on 07/15/15     FL2 transmitted to all facilities within larger geographic area on       Patient informed that his/her managed care company has contracts with or will negotiate with certain facilities, including the following:        Yes   Patient/family informed of bed offers received.  Patient chooses bed at [facility]     Physician recommends and patient chooses bed at      Patient to be transferred to [facility] on 07/19/15.  Patient to be transferred to facility by ambulance [facility])     Patient family notified on 07/19/15 of transfer.  Name of family member notified:  pt notified at bedside and pt daughter, Charlena Cross notified via telephone     PHYSICIAN Please sign FL2      Additional Comment:    _______________________________________________ Ladell Pier, LCSW 07/19/2015, 1:29 PM

## 2015-07-19 NOTE — Progress Notes (Signed)
Report called to [facility]. Report given to RN. Pt will be transported via [facility].

## 2015-07-19 NOTE — Clinical Social Work Placement (Signed)
   CLINICAL SOCIAL WORK PLACEMENT  NOTE  Date:  07/19/2015  Patient Details  Name: Ricky Terry MRN: [id number] Date of Birth: [birth year]  Clinical Social Work is seeking post-discharge placement for this patient at the [place] level of care (*CSW will initial, date and re-position this form in  chart as items are completed):  Yes   Patient/family provided with [place] Work Department's list of facilities offering this level of care within the geographic area requested by the patient (or if unable, by the patient's family).  Yes   Patient/family informed of their freedom to choose among providers that offer the needed level of care, that participate in Medicare, Medicaid or managed care program needed by the patient, have an available bed and are willing to accept the patient.  Yes   Patient/family informed of [place]'s ownership interest in [facility] and [facility], as well as of the fact that they are under no obligation to receive care at these facilities.  PASRR submitted to EDS on 07/15/15     PASRR number received on 07/15/15     Existing PASRR number confirmed on       FL2 transmitted to all facilities in geographic area requested by pt/family on 07/15/15     FL2 transmitted to all facilities within larger geographic area on       Patient informed that his/her managed care company has contracts with or will negotiate with certain facilities, including the following:        Yes   Patient/family informed of bed offers received.  Patient chooses bed at [facility]     Physician recommends and patient chooses bed at      Patient to be transferred to [facility] on 07/19/15.  Patient to be transferred to facility by ambulance [facility])     Patient family notified on 07/19/15 of transfer.  Name of family member notified:  pt notified at bedside and pt daughter, Ricky Terry notified via telephone      PHYSICIAN Please sign FL2     Additional Comment:    _______________________________________________ Ladell Pier, LCSW 07/19/2015, 3:26 PM

## 2015-07-19 NOTE — Progress Notes (Signed)
Physical Therapy Treatment Patient Details Name: Ricky Terry MRN: [id number] DOB: [birth year] Today's Date: 07/19/2015    History of Present Illness 69 y.o. 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    PT Comments    Pt. Progressing WELL with mobility and pain; for supine - sit patient utilizes bed rails for upper body/bringing trunk upright and requires min assist for navigation of BLE as patient prefers minimal knee flexion; pt. Presented on 3L [place] with 100% O2 sats, removed O2 and patient remained above 97% on room air seated EOB. Required min-mod assist for STS and bed elevated. Upon standing, pt. Presented steady and no increase in pain so ambulated in hallway ~120' with RW and min guard/min assist for safety as patient presents with step through gait pattern with MINIMAL KNEE FLEXION, average velocity and self awareness of safety. After ambulation, 02 Sats remained above 98% on RA - left pt. On RA after treatment - notified RN.   Follow Up Recommendations  SNF     Equipment Recommendations  None recommended by PT    Recommendations for Other Services       Precautions / Restrictions Precautions Precautions: Fall Precaution Comments: bone mets/pain Restrictions Weight Bearing Restrictions: No    Mobility  Bed Mobility Overal bed mobility: Needs Assistance Bed Mobility: Supine to Sit;Sit to Supine     Supine to sit: Min assist Sit to supine: Min assist   General bed mobility comments: bed rails for upper body/bringing trunk upright and min assist for navigation of BLE as patient prefers knees extended  Transfers Overall transfer level: Needs assistance Equipment used: Rolling walker (2 wheeled) Transfers: Sit to/from Stand Sit to Stand: Min assist;Mod assist         General transfer  comment: pt. required min-mod assist to ascend and steady  Ambulation/Gait Ambulation/Gait assistance: Min guard;Min assist Ambulation Distance (Feet): 120 Feet Assistive device: Rolling walker (2 wheeled) Gait Pattern/deviations: Step-through pattern (minimal B knee flexion )   Gait velocity interpretation: at or above normal speed for age/gender General Gait Details: initially requiring assist for steadying, increased reliance on UEs as well; improved with mobility and distance, SPO2 98-100% on room air, HR 117 bpm during gait   Stairs            Wheelchair Mobility    Modified Rankin (Stroke Patients Only)       Balance                                    Cognition Arousal/Alertness: Awake/alert Behavior During Therapy: WFL for tasks assessed/performed Overall Cognitive Status: Within Functional Limits for tasks assessed                      Exercises      General Comments        Pertinent Vitals/Pain Pain Assessment: 0-10 Pain Score: 7  Pain Location: Bilateral upper anterior thighs Pain Descriptors / Indicators: Constant Pain Intervention(s): Limited activity within patient's tolerance;Monitored during session    Home Living                      Prior Function            PT Goals (current goals can now be found  in the care plan section) Acute Rehab PT Goals Time For Goal Achievement: 07/30/15 Potential to Achieve Goals: Fair Progress towards PT goals: Progressing toward goals    Frequency  Min 3X/week    PT Plan      Co-evaluation             End of Session Equipment Utilized During Treatment: Gait belt Activity Tolerance: Patient tolerated treatment well Patient left: in bed;with call bell/phone within reach;with bed alarm set;with nursing/sitter in room     Time: [id number] PT Time Calculation (min) (ACUTE ONLY): 26 min  Charges:  $Gait Training: 8-22 mins $Therapeutic Activity: 8-22 mins                     G CodesDenna Haggard, SPTA   07/19/2015 1:00 PM   Pager: [phone]   Reviewed and agree with above [name]  PTA [facility]  Acute  Rehab Pager      [phone]

## 2015-07-19 NOTE — Progress Notes (Signed)
   07/19/15 1500  Clinical Encounter Type  Visited With Patient;Other (Comment) (CSW)  Visit Type Initial  Referral From Palliative care team  Consult/Referral To Chaplain  Spiritual Encounters  Spiritual Needs Emotional  [place] visited with pt prior to d/c to SNF; pt upbeat and very positive; [place] offered spiritual and emotional support. Gwynn Burly 3:22 PM

## 2015-07-19 NOTE — Discharge Summary (Addendum)
Physician Discharge Summary  Ricky Terry:[id number] DOB: 01-06-1946 DOA: 07/12/2015  PCP: [name], MD  Admit date: 07/12/2015 Discharge date: 07/19/2015  Recommendations for Outpatient Follow-up:  1. Pt will need to follow up with PCP in 1-2 weeks post discharge 2. Please obtain BMP to evaluate electrolytes and kidney function 3. Please also check CBC to evaluate Hg and Hct levels 4. Please note that pt was started on Decadron 4 mg PO TID and Dr. Alen Blew recommended to continue with slow taper, continue this regimen for 7 days and he will continue from there and taper down based on how pt is doing  5. Oncology office will schedule pt for an appointment in next 2 weeks and will notify pt 6. Please note that pt has had mild anemia but no blood in stool, Plavix has been resumed upon discharge but can be stopped if any bleeding reported 7. Please note that Lantus and SSI have been added while pt is on Decadron and he is to continue Metformin and Glipizide as well as per home medical regimen   Discharge Diagnoses:  Principal Problem:   Acute respiratory failure with hypoxia ([place]) Active Problems:   Protein-calorie malnutrition, severe   Aspiration pneumonia (HCC)   Sepsis due to mulitfocal pneumonia (HCC)   Tachycardia   Accelerated hypertension   DM type 2, uncontrolled, with neuropathy (HCC)   Thrombocytopenia (HCC)   Anemia of chronic disease, prostate cancer   Prostate cancer ([place])   Hyponatremia   Hypokalemia   Chronic diastolic CHF (congestive heart failure), NYHA class 1 ([place])   Palliative care encounter   Cancer associated pain  Discharge Condition: Stable  Diet recommendation: Heart healthy diet discussed in details    Brief narrative:    68 y.o. 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 Pt presented to [facility] ED with main concern of several days duration of progressive dyspnea mostly with exertion but occasionally present at rest, associated with productive cough of greenish sputum, fevers, chills, malaise.  In ED, CTA of the chest notable for multiple sclerotic lesion thoracic spine and multiple airspace opacities in the right middle lower lobes consistent multifocal pneumonia. TRH asked admit pt for further evaluation.   Assessment/Plan:    Principal Problem:  Acute respiratory failure with hypoxia ([place]) - please note that oxygen level dropped to 69 % on 11/24 and have stabilized with O2 via [place]  - secondary to right middle and lower lobe PNA and ? Aspiration PNA, strep non group A - pt completed ABX regimen while inpatient  - BC negative to date  - sputum culture with strep non group A   Active Problems:  Bilateral thigh pain - from metastatic disease - CT of bilateral femurs confirming  - provide analgesia as needed - continue decadron tapering, very slow taper per Dr. Alen Blew recommended, needs to continue 4 mg OP TID for at least 7 days and Dr. Alen Blew will resume tapering plan after that    Protein-calorie malnutrition, severe - nutritionist consulted - pt tolerating diet well   Opioid induced constipation  - placed on Miralax, senokot - had BM this AM   Sepsis due to mulitfocal pneumonia ([place]) - please note that pt met criteria for sepsis on admission with T 100.2 F, HR > 90, RR > 22, WBC 30 K - ABX as noted above, completed -  sepsis resolved  - blood cultures NGTD   Tachycardia - this is reactive and secondary to sepsis and PNA outlined above   Accelerated hypertension - currently on losartan and will continue same regimen    DM type 2, uncontrolled, with neuropathy ([place]) - resume metformin and glipizide and since pt now on decadron, he needs lantus and SSI - A1C 10/25 was > 14, continue with Lantus 5 U and will continue SSI -  diabetic educator consulted and we certainly appreciate recommendations   Thrombocytopenia ([place]) - no signs of bleeding, resolved    Anemia of malignancy - resume plavix    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 Hazeline Junker assistance  - pt was on neulasta and steroids    Hyponatremia - appears to be pre renal   Hypokalemia - supplemented   Chronic diastolic CHF (congestive heart failure), NYHA class 1 ([place]) - last 2 D ECHO done in Oct 2016, no need for repeat 2 D ECHO - grade I diastolic CHF noted on last 2 D ECHO - weight remains 127 - 130 lbs   Code Status: Full.  Family Communication: plan of care discussed with the patient Disposition Plan: SNF  IV access:  Peripheral IV  Procedures and diagnostic studies:   Dg Chest 2 View 07/12/2015 Right lower lobe infiltrate consistent with pneumonia. Lungs elsewhere clear. Areas of sclerotic bony metastatic disease.   Ct Angio Chest Pe W/cm &/or Wo Cm 07/12/2015 There is no evidence of pulmonary embolus. Multiple sclerotic lesions are noted in the thoracic spine consistent with osseous metastases. Multiple ill-defined airspace opacities are noted in the right middle and lower lobes consistent with multifocal pneumonia. There is also noted fluid or debris in the lower lobe bronchi bilaterally, but most prominently on the right. This is concerning for mucous plugging or aspiration.   Medical Consultants:  Oncology   Other Consultants:  PT Diabetic educator   IAnti-Infectives:   Vancomycin and Zosyn 11/23 --> transitioned to oral Levaquin 11/26 --> 11/29      Discharge Exam: Filed Vitals:   07/19/15 0157 07/19/15 0516  BP: 132/59 137/65  Pulse: 106 104  Temp: 97.8 F (36.6 C) 98.6 F (37 C)  Resp: 18 18   Filed Vitals:   07/18/15 1500 07/18/15 2128 07/19/15 0157 07/19/15 0516  BP: 146/70 107/55 132/59 137/65  Pulse: 112 103 106 104  Temp: 97.7 F  (36.5 C) 98.3 F (36.8 C) 97.8 F (36.6 C) 98.6 F (37 C)  TempSrc: Oral Oral Oral Oral  Resp: '18 18 18 18  ' Height:      Weight:    59.467 kg (131 lb 1.6 oz)  SpO2: 97% 100% 100% 100%    General: Pt is alert, follows commands appropriately, not in acute distress Cardiovascular: Regular rate and rhythm, no rubs, no gallops Respiratory: Clear to auscultation bilaterally, no wheezing, no crackles, no rhonchi Abdominal: Soft, non tender, non distended, bowel sounds +, no guarding  Discharge Instructions  Discharge Instructions    Diet - low sodium heart healthy    Complete by:  As directed      Diet - low sodium heart healthy    Complete by:  As directed      Increase activity slowly    Complete by:  As directed      Increase activity slowly    Complete by:  As directed             Medication List  STOP taking these medications        NEULASTA Coates     oxyCODONE-acetaminophen 5-325 MG tablet  Commonly known as:  PERCOCET/ROXICET      TAKE these medications        aspirin 81 MG chewable tablet  Chew 1 tablet (81 mg total) by mouth daily.     atorvastatin 40 MG tablet  Commonly known as:  LIPITOR  Take 1 tablet (40 mg total) by mouth at bedtime.     bisacodyl 10 MG suppository  Commonly known as:  DULCOLAX  Place 1 suppository (10 mg total) rectally daily.     cholecalciferol 400 UNITS Tabs tablet  Commonly known as:  VITAMIN D  Take 400 Units by mouth daily.     clopidogrel 75 MG tablet  Commonly known as:  PLAVIX  Take 1 tablet (75 mg total) by mouth daily with breakfast.     dexamethasone 4 MG tablet  Commonly known as:  DECADRON  Take 1 tablet (4 mg total) by mouth 3 (three) times daily.     glipiZIDE 10 MG tablet  Commonly known as:  GLUCOTROL  Take 1 tablet (10 mg total) by mouth daily before breakfast.     insulin aspart 100 UNIT/ML injection  Commonly known as:  novoLOG  Inject 0-20 Units into the skin 3 (three) times daily with meals.      insulin glargine 100 UNIT/ML injection  Commonly known as:  LANTUS  Inject 0.05 mLs (5 Units total) into the skin at bedtime.     lidocaine-prilocaine cream  Commonly known as:  EMLA  Apply 1 application topically as needed.     losartan 50 MG tablet  Commonly known as:  COZAAR  Take 1 tablet (50 mg total) by mouth daily.     metFORMIN 1000 MG tablet  Commonly known as:  GLUCOPHAGE  Take 1 tablet (1,000 mg total) by mouth 2 (two) times daily.     oxyCODONE 5 MG immediate release tablet  Commonly known as:  Oxy IR/ROXICODONE  Take 1 tablet (5 mg total) by mouth every 4 (four) hours as needed for moderate pain.     oxyCODONE 15 mg 12 hr tablet  Commonly known as:  OXYCONTIN  Take 1 tablet (15 mg total) by mouth every 12 (twelve) hours.     prochlorperazine 10 MG tablet  Commonly known as:  COMPAZINE  Take 1 tablet (10 mg total) by mouth every 6 (six) hours as needed for nausea or vomiting.     senna-docusate 8.6-50 MG tablet  Commonly known as:  Senokot-S  Take 1 tablet by mouth 2 (two) times daily.     traZODone 50 MG tablet  Commonly known as:  DESYREL  Take 0.5 tablets (25 mg total) by mouth at bedtime as needed for sleep.            Follow-up Information    Follow up with [name],GEORGE, MD.   Specialty:  Internal Medicine   Contact information:   [address] [place] [address] [phone]       Call [name], MD.   Specialty:  Internal Medicine   Why:  As needed call my cell phone [phone]   Contact information:   [address] [place] [address] [phone]       Follow up with [name], MD.   Specialty:  Internal Medicine   Contact information:   [place] [address]  [address] [phone]        The results of significant diagnostics from this hospitalization (including imaging, microbiology, ancillary and laboratory) are listed below for reference.      Microbiology: Recent Results (from the past 240 hour(s))  Culture, blood (routine x 2)     Status: None   Collection Time: 07/12/15  4:00 PM  Result Value Ref Range Status   Specimen Description BLOOD RIGHT CHEST  Final   Special Requests BOTTLES DRAWN AEROBIC AND ANAEROBIC 5CC EACH  Final   Culture   Final    NO GROWTH 5 DAYS Performed at [facility]    Report Status 07/17/2015 FINAL  Final  Culture, blood (routine x 2)     Status: None   Collection Time: 07/12/15 11:10 PM  Result Value Ref Range Status   Specimen Description BLOOD LEFT ANTECUBITAL  Final   Special Requests BOTTLES DRAWN AEROBIC ONLY 6 ML  Final   Culture   Final    NO GROWTH 5 DAYS Performed at [facility]    Report Status 07/18/2015 FINAL  Final  Culture, sputum-assessment     Status: None   Collection Time: 07/13/15  8:14 AM  Result Value Ref Range Status   Specimen Description SPUTUM  Final   Special Requests NONE  Final   Sputum evaluation   Final    THIS SPECIMEN IS ACCEPTABLE. RESPIRATORY CULTURE REPORT TO FOLLOW.   Report Status 07/13/2015 FINAL  Final  Culture, respiratory (NON-Expectorated)     Status: None   Collection Time: 07/13/15  8:14 AM  Result Value Ref Range Status   Specimen Description SPUTUM  Final   Special Requests NONE  Final   Gram Stain   Final    ABUNDANT WBC PRESENT,BOTH PMN AND MONONUCLEAR RARE SQUAMOUS EPITHELIAL CELLS PRESENT FEW GRAM POSITIVE COCCI IN PAIRS RARE GRAM NEGATIVE RODS Performed at [facility]    Culture   Final    MODERATE STREPTOCOCCUS,BETA HEMOLYTIC NOT GROUP A Performed at [facility]    Report Status 07/16/2015 FINAL  Final     Labs: Basic Metabolic Panel:  Recent Labs Lab 07/13/15 1000 07/14/15 0655 07/15/15 0432 07/16/15 0258 07/17/15 0305 07/18/15 1100  NA 133* 134* 131* 132* 129* 129*  K 3.4* 3.7 3.2* 3.6 3.5 4.3  CL 99* 99* 95* 95* 92* 92*  CO2 '22 27 26 27 27 26  ' GLUCOSE 221* 143* 210* 209*  228* 480*  BUN '8 6 7 6 7 ' 24*  CREATININE 0.48* 0.35* 0.41* 0.44* 0.47* 0.73  CALCIUM 9.4 9.3 8.7* 9.0 8.7* 8.4*  MG 1.8  --   --   --   --   --   PHOS 2.4*  --   --   --   --   --    Liver Function Tests:  Recent Labs Lab 07/13/15 1000  AST 34  ALT 23  ALKPHOS 352*  BILITOT 0.8  PROT 6.4*  ALBUMIN 3.2*   CBC:  Recent Labs Lab 07/12/15 1600 07/14/15 0435 07/15/15 0432 07/16/15 0258 07/17/15 0305  WBC 30.8* 23.0* 21.9* 20.0* 20.1*  NEUTROABS 29.0*  --   --   --   --   HGB 7.3* 10.2* 9.3* 9.4* 8.9*  HCT 22.0* 29.4* 28.4* 28.3* 26.7*  MCV 81.5 79.7 79.8 81.1 81.2  PLT 121* 242 198 232 277   Cardiac Enzymes:  Recent Labs Lab 07/12/15 2310 07/13/15 0959 07/13/15 1545  TROPONINI 0.03 0.03 <0.03  CBG:  Recent Labs Lab 07/18/15 0732 07/18/15 1152 07/18/15 1735 07/18/15 2126 07/19/15 0748  GLUCAP 424* 479* 350* 225* 361*     SIGNED: Time coordinating discharge:  30 minutes  MAGICK-Bodi Palmeri, MD  [facility] 07/19/2015, 10:27 AM Pager [phone]  If 7PM-7AM, please contact night-coverage [email or web address] Password [email or web address]

## 2015-07-19 NOTE — NC FL2 (Signed)
[place] LEVEL OF CARE SCREENING TOOL     IDENTIFICATION  Patient Name: Ricky Terry Birthdate: [birth year] Sex: male Admission Date (Current Location): 07/12/2015  [place] and [place] Number: [facility] and Address:  [facility],  [place] [address] [place]      Provider Number: [id number]  Attending Physician Name and Address:  Theodis Blaze, MD  Relative Name and Phone Number:       Current Level of Care: Hospital Recommended Level of Care: [place] Prior Approval Number:    Date Approved/Denied:   PASRR Number: [id number]  Discharge Plan: SNF    Current Diagnoses: Patient Active Problem List   Diagnosis Date Noted  . Palliative care encounter 07/17/2015  . Cancer associated pain 07/17/2015  . Accelerated hypertension 07/13/2015  . Acute respiratory failure with hypoxia ([place]) 07/13/2015  . Sepsis due to mulitfocal pneumonia ([place]) 07/13/2015  . Hyponatremia 07/13/2015  . Hypokalemia 07/13/2015  . DM type 2, uncontrolled, with neuropathy ([place]) 07/13/2015  . Chronic diastolic CHF (congestive heart failure), NYHA class 1 ([place]) 07/13/2015  . Thrombocytopenia ([place]) 07/13/2015  . Anemia of chronic disease, prostate cancer 07/13/2015  . Aspiration pneumonia ([place]) 07/12/2015  . Tachycardia 07/12/2015  . Protein-calorie malnutrition, severe 06/14/2015  . Prostate cancer ([place]) 05/23/2015    Orientation ACTIVITIES/SOCIAL BLADDER RESPIRATION    Self, Time, Situation, Place  Active Continent O2 (As needed) (2.5 L/min)  BEHAVIORAL SYMPTOMS/MOOD NEUROLOGICAL BOWEL NUTRITION STATUS     (NONE) Continent Diet (Heart Healthy)  PHYSICIAN VISITS COMMUNICATION OF NEEDS Height & Weight Skin    Verbally 5\' 6"  (167.6 cm) 131 lbs. Normal          AMBULATORY STATUS RESPIRATION    Assist extensive O2 (As needed) (2.5 L/min)      Personal Care Assistance Level of Assistance  Bathing, Dressing, Feeding  Bathing Assistance: Limited assistance Feeding assistance: Independent Dressing Assistance: Limited assistance      Functional Limitations Info  Sight, Hearing, Speech Sight Info: Adequate Hearing Info: Adequate Speech Info: Adequate       SPECIAL CARE FACTORS FREQUENCY  PT (By licensed PT), OT (By licensed OT)     PT Frequency: 5 x a week OT Frequency: 5 x a week           Additional Factors Info  Code Status, Allergies Code Status Info: FULL code status Allergies Info: No Known Allergies.           Current Medications (07/19/2015):  This is the current hospital active medication list Current Facility-Administered Medications  Medication Dose Route Frequency Provider Last Rate Last Dose  . aspirin chewable tablet 81 mg  81 mg Oral Daily Toy Baker, MD   81 mg at 07/19/15 0921  . atorvastatin (LIPITOR) tablet 40 mg  40 mg Oral QHS Toy Baker, MD   40 mg at 07/18/15 2156  . bisacodyl (DULCOLAX) suppository 10 mg  10 mg Rectal Daily Theodis Blaze, MD   10 mg at 07/19/15 [id number]  . dexamethasone (DECADRON) tablet 4 mg  4 mg Oral 3 times per day Theodis Blaze, MD      . feeding supplement (ENSURE ENLIVE) (ENSURE ENLIVE) liquid 237 mL  237 mL Oral BID BM Clayton Bibles, RD   237 mL at 07/18/15 1051  . HYDROmorphone (DILAUDID) injection 0.5 mg  0.5 mg Intravenous [id number] PRN Pershing Proud, NP      . insulin aspart (novoLOG) injection 0-20  Units  0-20 Units Subcutaneous TID WC Theodis Blaze, MD   20 Units at 07/19/15 0800  . insulin aspart (novoLOG) injection 0-5 Units  0-5 Units Subcutaneous QHS Theodis Blaze, MD   2 Units at 07/18/15 2154  . insulin aspart (novoLOG) injection 4 Units  4 Units Subcutaneous TID WC Theodis Blaze, MD   4 Units at 07/19/15 0800  . insulin glargine (LANTUS) injection 5 Units  5 Units Subcutaneous QHS Theodis Blaze, MD   5 Units at 07/18/15 2156  . levofloxacin (LEVAQUIN) tablet 500 mg  500 mg Oral Daily Theodis Blaze, MD   500 mg at  07/19/15 [id number]  . losartan (COZAAR) tablet 50 mg  50 mg Oral Daily Toy Baker, MD   50 mg at 07/19/15 0921  . oxyCODONE (Oxy IR/ROXICODONE) immediate release tablet 5 mg  5 mg Oral [id number] PRN Pershing Proud, NP   5 mg at 07/19/15 [id number]  . oxyCODONE (OXYCONTIN) 12 hr tablet 15 mg  15 mg Oral [id number] Pershing Proud, NP   15 mg at 07/19/15 [id number]  . polyethylene glycol (MIRALAX / GLYCOLAX) packet 17 g  17 g Oral BID Theodis Blaze, MD   17 g at 07/19/15 [id number]  . senna-docusate (Senokot-S) tablet 1 tablet  1 tablet Oral BID Theodis Blaze, MD   1 tablet at 07/19/15 [id number]  . sodium chloride 0.9 % injection 10-40 mL  10-40 mL Intracatheter PRN Toy Baker, MD   10 mL at 07/17/15 0305  . traZODone (DESYREL) tablet 25 mg  25 mg Oral QHS PRN Pershing Proud, NP         Discharge Medications: Please see discharge summary for a list of discharge medications.  Relevant Imaging Results:  Relevant Lab Results:  Recent Labs    Additional Information SSN: [id number] Patient has prostate cancer, under the care of Dr. Alen Blew at [facility], pt currently on systemic chemotherapy in the form of Taxotere.  Oncology office will schedule pt for an appointment in next 2 weeks  KIDD, SUZANNA A, LCSW

## 2015-07-19 NOTE — Progress Notes (Signed)
CSW continuing to follow for disposition planning.  Per MD, pt medically ready for discharge today.   CSW met with pt at bedside to discuss SNF bed offers. Pt chooses [facility]. CSW spoke with [facility] and facility stated that they were out-of-network with pt insurance and pt would have a daily copayment. CSW followed up with pt regarding out of network cost and pt does not want to go to a facility that he would have to pay out of network costs. CSW reviewed with pt the facilities that the insurance company states that are in network with pt insurance. Pt chooses bed at [facility] which is in network with facility and understands that pt family would be responsible for transportation to [place]. Pt states that family could transport. CSW contacted pt daughter via telephone at bedside to discuss plan for [facility] and pt daughter confirmed that pt family could assist with transportation and pt daughter prefers Wednesday appointments. CSW provided pt daughter with contact number to cancer center and left message with Dr. Alen Blew scheduler about request.   CSW notified [employer] and received authorization for pt admission to [facility] (auth#: [id number]).   CSW facilitated pt discharge needs including contacting facility, faxing pt discharge information via [place], discussing with pt at bedside and pt daughter via telephone, pt walker is in pt hospital room and pt states that pt family can pick up walker from hospital as ambulance usually does not take walker, providing RN phone number to call report, and arranging ambulance transport for pt to [facility].   No further social work needs identified at this time.  CSW signing off.   Alison Murray, MSW, [place] [facility] [phone]

## 2015-07-21 ENCOUNTER — Ambulatory Visit: Payer: PPO

## 2015-07-24 LAB — T3: T3 TOTAL: 72

## 2015-07-26 ENCOUNTER — Encounter: Payer: Self-pay | Admitting: Oncology

## 2015-07-26 NOTE — Progress Notes (Signed)
Application denied from [employer] for Neulasta assistance due to insurance covers medication.

## 2015-07-31 ENCOUNTER — Encounter: Payer: Self-pay | Admitting: Radiation Oncology

## 2015-07-31 NOTE — Progress Notes (Signed)
Histology and Location of Primary Cancer: prostate cancer  Sites of Visceral and Bony Metastatic Disease: Per bone scan from 05/01/15 - "Innumerable lesions noted throughout the spine, sternum, ribs, bony pelvis and proximal femurs, right greater than left."    Location(s) of Symptomatic Metastases: bil legs  Past/Anticipated chemotherapy by medical oncology, if any: Taxotere chemotherapy and indefinite androgen deprivation    Pain on a scale of 0-10 is: 2 in both legs.  He takes OxyContin 15 mg in the morning and oxycodone 5 mg at night.   If Spine Met(s), symptoms, if any, include:  Bowel/Bladder retention or incontinence (please describe): no  Numbness or weakness in extremities (please describe): left leg weakness from past CVA.  Current Decadron regimen, if applicable: 4 mg once a day.  Ambulatory status? Walker? Wheelchair?: here in a wheelchair, uses a walker at home.  SAFETY ISSUES:  Prior radiation? no  Pacemaker/ICD? no  Possible current pregnancy? no  Is the patient on methotrexate? no  Current Complaints / other details:    BP 105/50 mmHg  Pulse 109  Temp(Src) 97.8 F (36.6 C) (Oral)  Resp 18  Ht '5\' 6"'  (1.676 m)  Wt 122 lb 8 oz (55.566 kg)  BMI 19.78 kg/m2  SpO2 100%

## 2015-08-02 ENCOUNTER — Ambulatory Visit
Admission: RE | Admit: 2015-08-02 | Discharge: 2015-08-02 | Disposition: A | Discharge status: Home / Self Care | Payer: PPO | Source: Ambulatory Visit | Attending: Radiation Oncology | Admitting: Radiation Oncology

## 2015-08-02 ENCOUNTER — Telehealth: Payer: Self-pay | Admitting: *Deleted

## 2015-08-02 ENCOUNTER — Encounter: Payer: Self-pay | Admitting: Radiation Oncology

## 2015-08-02 VITALS — BP 105/50 | HR 109 | Temp 97.8°F | Resp 18 | Ht 66.0 in | Wt 122.5 lb

## 2015-08-02 DIAGNOSIS — C7951 Secondary malignant neoplasm of bone: Secondary | ICD-10-CM

## 2015-08-02 DIAGNOSIS — C61 Malignant neoplasm of prostate: Secondary | ICD-10-CM | POA: Insufficient documentation

## 2015-08-02 HISTORY — DX: Secondary malignant neoplasm of bone: C79.51

## 2015-08-02 NOTE — Telephone Encounter (Signed)
FYI Call from patient's P.O.A and significant other asking where today's appointments are to ensure he is in  The right location.  Also asked what stage of cancer he is in.  "I've been with him for four years, he selected me as his P.O.A, it's hard to make decisions about care and I do not know what is going on.  Are there any pamphlets about his stage of cancer?  I work during the hours of his appointment times.  He is being released from [facility] Thursday.  Why is he being discharged after only twenty days?"   Advised a copy of P.O.A. Be brought to H.I.M., sign for release of records.  Informed her he has met goals of rehab and insurance is no longer covering but to call [place] for specifics.  No further questions.

## 2015-08-02 NOTE — Telephone Encounter (Signed)
Called patient to inform of bone scan on 08-09-15- arrival time - 9:45 am, spoke with patient and he is aware of this test

## 2015-08-02 NOTE — Progress Notes (Signed)
Radiation Oncology         [phone] ________________________________  Initial Outpatient Consultation  Name: Ricky Terry MRN: [id number]  Date: 08/02/2015  DOB: 1946/03/07  [id number], MD  Wyatt Portela, MD   REFERRING PHYSICIAN: Wyatt Portela, MD  DIAGNOSIS: The primary encounter diagnosis was Prostate cancer [facility]). A diagnosis of Neoplasm of prostate, distant metastasis staging category M1b: metastasis to bone [facility]) was also pertinent to this visit.                        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 male who has been referred to me today by Dr.Shadad to discuss palliative treatment to painful bony metastases from primary prostate cancer. Mr. Hazel is a 69 year old gentleman currently of  where he lived the majority of his life. He is a gentleman with history of hypertension, hyperlipidemia and diabetes. He was diagnosed with prostate cancer in April 2015. His prostate cancer is currently being treated with Taxotere chemotherapy and androgen deprivation. Patient was recently admitted to the ED for pneumonia on 07/09/15, discharged on 07/19/15. Patient states he has been off antibiotics for about a week.   On visit, the patient presents in a wheelchair. He states that his left leg pain has been more significant pain over his right leg pain. No other areas of pain at this time. He denies any numbness or weakness in his lower extremities. He does not notice significant worsening of his pain with standing or walking   PREVIOUS RADIATION THERAPY: No  PAST MEDICAL HISTORY:  has a past medical history of Stroke ([place]); Diabetes mellitus; Hypertension; Prostate cancer ([place]); and Bone metastases ([place]).    PAST SURGICAL HISTORY: Past Surgical History  Procedure Laterality Date  . Eye surgery      FAMILY HISTORY: family history includes Cancer in his  father and mother; Diabetes in his brother and sister; Hypertension in his brother, father, mother, and sister.  SOCIAL HISTORY:  reports that he has never smoked. He does not have any smokeless tobacco history on file. He reports that he does not drink alcohol or use illicit drugs.  ALLERGIES: Review of patient's allergies indicates no known allergies.  MEDICATIONS:  Current Outpatient Prescriptions  Medication Sig Dispense Refill  . aspirin 81 MG chewable tablet Chew 1 tablet (81 mg total) by mouth daily. 30 tablet 0  . atorvastatin (LIPITOR) 40 MG tablet Take 1 tablet (40 mg total) by mouth at bedtime. 30 tablet 0  . bisacodyl (DULCOLAX) 10 MG suppository Place 1 suppository (10 mg total) rectally daily. 12 suppository 0  . cholecalciferol (VITAMIN D) 400 UNITS TABS tablet Take 400 Units by mouth daily.    . clopidogrel (PLAVIX) 75 MG tablet Take 1 tablet (75 mg total) by mouth daily with breakfast. 30 tablet 1  . dexamethasone (DECADRON) 4 MG tablet Take 1 tablet (4 mg total) by mouth 3 (three) times daily. 21 tablet 1  . glipiZIDE (GLUCOTROL) 10 MG tablet Take 1 tablet (10 mg total) by mouth daily before breakfast. 30 tablet 0  . insulin aspart (NOVOLOG) 100 UNIT/ML injection Inject 0-20 Units into the skin 3 (three) times daily with meals. 10 mL 11  . insulin glargine (LANTUS) 100 UNIT/ML injection Inject 0.05 mLs (5 Units total) into the skin at bedtime. 10 mL 11  . lidocaine-prilocaine (EMLA) cream Apply 1 application topically as needed. [place]  g 0  . losartan (COZAAR) 50 MG tablet Take 1 tablet (50 mg total) by mouth daily. 30 tablet 0  . metFORMIN (GLUCOPHAGE) 1000 MG tablet Take 1 tablet (1,000 mg total) by mouth 2 (two) times daily. 30 tablet 0  . oxyCODONE (OXY IR/ROXICODONE) 5 MG immediate release tablet Take 1 tablet (5 mg total) by mouth every 4 (four) hours as needed for moderate pain. 30 tablet 0  . oxyCODONE (OXYCONTIN) 15 mg 12 hr tablet Take 1 tablet (15 mg total) by mouth  every 12 (twelve) hours. 60 tablet 0  . senna-docusate (SENOKOT-S) 8.6-50 MG tablet Take 1 tablet by mouth 2 (two) times daily. 60 tablet 0  . traZODone (DESYREL) 50 MG tablet Take 0.5 tablets (25 mg total) by mouth at bedtime as needed for sleep. 30 tablet 0  . prochlorperazine (COMPAZINE) 10 MG tablet Take 1 tablet (10 mg total) by mouth every 6 (six) hours as needed for nausea or vomiting. (Patient not taking: Reported on 08/02/2015) 30 tablet 1   No current facility-administered medications for this encounter.    REVIEW OF SYSTEMS:  A 15 point review of systems is documented in the electronic medical record. This was obtained by the nursing staff. However, I reviewed this with the patient to discuss relevant findings and make appropriate changes.  Pertinent items are noted in HPI.   PHYSICAL EXAM:  height is 5\' 6"  (1.676 m) and weight is 122 lb 8 oz (55.566 kg). His oral temperature is 97.8 F (36.6 C). His blood pressure is 105/50 and his pulse is 109. His respiration is 18 and oxygen saturation is 100%.   Port-a-Cath present in right upper chest Presents in a wheelchair General: Well-developed, in no acute distress HEENT: Normocephalic, atraumatic, dentures in place, unable to see out of right eye Cardiovascular: Regular rate and rhythm Respiratory: Clear to auscultation bilaterally GI: Soft, nontender, normal bowel sounds Extremities: No edema present. Motor strength appears to be 5 out of 5 in the proximal and distal muscle groups of the upper and lower extremities. Palpation along the pelvic bony areas reveals some mild discomfort in the left iliac crest area as well as the right greater trochanter area  ECOG = 2  LABORATORY DATA:  Lab Results  Component Value Date   WBC 20.1* 07/17/2015   HGB 8.9* 07/17/2015   HCT 26.7* 07/17/2015   MCV 81.2 07/17/2015   PLT 277 07/17/2015   NEUTROABS 29.0* 07/12/2015   Lab Results  Component Value Date   NA 129* 07/18/2015   K 4.3  07/18/2015   CL 92* 07/18/2015   CO2 26 07/18/2015   GLUCOSE 480* 07/18/2015   CREATININE 0.73 07/18/2015   CALCIUM 8.4* 07/18/2015      RADIOGRAPHY: Dg Chest 2 View  07/12/2015  CLINICAL DATA:  Two day history of productive cough. Hypertension. History prostate carcinoma. EXAM: CHEST  2 VIEW COMPARISON:  Chest radiograph June 12, 2015; chest CT June 13, 2015 FINDINGS: There is patchy infiltrate in the right lower lobe. The lungs elsewhere are clear. Heart size and pulmonary vascularity are normal. No adenopathy. Port-A-Cath tip is in the superior vena cava. No pneumothorax. Irregular sclerosis in the left humeral head region is stable. Sclerosis in the T7 vertebral body is likewise stable. IMPRESSION: Right lower lobe infiltrate consistent with pneumonia. Lungs elsewhere clear. Areas of sclerotic bony metastatic disease. Electronically Signed   By: Lowella Grip III M.D.   On: 07/12/2015 14:20   Ct Angio Chest Pe W/cm &/  or Wo Cm  07/12/2015  CLINICAL DATA:  Cough, congestion. Tachycardia. Current history of metastatic prostate cancer. EXAM: CT ANGIOGRAPHY CHEST WITH CONTRAST TECHNIQUE: Multidetector CT imaging of the chest was performed using the standard protocol during bolus administration of intravenous contrast. Multiplanar CT image reconstructions and MIPs were obtained to evaluate the vascular anatomy. CONTRAST:  131mL OMNIPAQUE IOHEXOL 350 MG/ML SOLN COMPARISON:  CT scan of June 13, 2015. FINDINGS: Multiple sclerotic lesions are noted in the thoracic spine consistent with metastatic disease. No pneumothorax or pleural effusion is noted. Left lung is clear. Ill-defined multifocal airspace opacities are noted in the right middle and lower lobes most consistent with multifocal pneumonia; these were not present on prior exam. There is no evidence of thoracic aortic dissection or aneurysm. Material is noted in the lower lobe bronchi bilaterally, more prominent on the right, consistent  with mucous plugging or aspiration. This also is new since prior exam. Visualized portion of upper abdomen is unremarkable. There is no definite evidence of pulmonary embolus. Right internal jugular Port-A-Cath is noted with distal tip at the cavoatrial junction. No mediastinal mass or adenopathy is noted. Review of the MIP images confirms the above findings. IMPRESSION: There is no evidence of pulmonary embolus. Multiple sclerotic lesions are noted in the thoracic spine consistent with osseous metastases. Multiple ill-defined airspace opacities are noted in the right middle and lower lobes consistent with multifocal pneumonia. There is also noted fluid or debris in the lower lobe bronchi bilaterally, but most prominently on the right. This is concerning for mucous plugging or aspiration. Electronically Signed   By: Marijo Conception, M.D.   On: 07/12/2015 18:19   Ct Femur Left Wo Contrast  07/17/2015  CLINICAL DATA:  Thigh pain.  Unable to bear weight. EXAM: CT OF THE LEFT FEMUR WITHOUT CONTRAST; CT OF THE RIGHT FEMUR WITHOUT CONTRAST TECHNIQUE: Multidetector CT imaging was performed according to the standard protocol. Multiplanar CT image reconstructions were also generated. COMPARISON:  05/01/2015 -nuclear bone scan FINDINGS: There is mixed lytic and sclerotic osseous lesion involving the right femoral head and neck with mild surrounding periostitis along the superior femoral neck. There is a mixed lytic and sclerotic osseous lesion involving the left ischial tuberosity and the right superior pubic ramus - acetabular junction. There is no acute fracture or dislocation. 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 There is no focal fluid collection or hematoma. There is mild soft tissue edema in the subcutaneous fat of bilateral thighs. There is no muscle atrophy. IMPRESSION: 1. Mixed lytic and sclerotic osseous lesions involving the right femoral head and neck, left ischial  tuberosity and right superior pubic ramus - acetabular junction most consistent with metastatic disease. There is no acute fracture or dislocation. If there is clinical concern regarding an occult fracture, evaluation with MRI is recommended for increased sensitivity. Electronically Signed   By: Kathreen Devoid   On: 07/17/2015 17:46   Ct Femur Right Wo Contrast  07/17/2015  CLINICAL DATA:  Thigh pain.  Unable to bear weight. EXAM: CT OF THE LEFT FEMUR WITHOUT CONTRAST; CT OF THE RIGHT FEMUR WITHOUT CONTRAST TECHNIQUE: Multidetector CT imaging was performed according to the standard protocol. Multiplanar CT image reconstructions were also generated. COMPARISON:  05/01/2015 -nuclear bone scan FINDINGS: There is mixed lytic and sclerotic osseous lesion involving the right femoral head and neck with mild surrounding periostitis along the superior femoral neck. There is a mixed lytic and sclerotic osseous lesion involving the left ischial  tuberosity and the right superior pubic ramus - acetabular junction. There is no acute fracture or dislocation. 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 There is no focal fluid collection or hematoma. There is mild soft tissue edema in the subcutaneous fat of bilateral thighs. There is no muscle atrophy. IMPRESSION: 1. Mixed lytic and sclerotic osseous lesions involving the right femoral head and neck, left ischial tuberosity and right superior pubic ramus - acetabular junction most consistent with metastatic disease. There is no acute fracture or dislocation. If there is clinical concern regarding an occult fracture, evaluation with MRI is recommended for increased sensitivity. Electronically Signed   By: Kathreen Devoid   On: 07/17/2015 17:46   Dg Femur Min 2 Views Left  07/09/2015  CLINICAL DATA:  Prostate cancer.  BILATERAL leg pain. EXAM: LEFT FEMUR 2 VIEWS COMPARISON:  Bone scan 05/01/2015. FINDINGS: No significant sclerotic or lytic  lesions are observed in the LEFT femur. There is no fracture. The adjacent LEFT ischium demonstrates sclerosis, similar to the distribution noted on previous nuclear medicine scintigraphy scan. Soft tissue calcification adjacent to the lesser trochanter. IMPRESSION: No visible LEFT femur metastases or acute findings. Electronically Signed   By: Staci Righter M.D.   On: 07/09/2015 19:10   Dg Femur, Min 2 Views Right  07/09/2015  CLINICAL DATA:  BILATERAL leg pain for 4-5 months. History of prostate cancer. EXAM: RIGHT FEMUR 2 VIEWS COMPARISON:  Bone scan 05/01/2015.  LEFT femur reported separately. FINDINGS: Sclerotic metastases are noted throughout the proximal RIGHT femur, including the greater and lesser trochanter, femoral neck, and femoral head. The adjacent pelvis is also affected in the acetabular region, iliac waiting, and superior pubic ramus. No pathologic fracture is evident. Vascular calcification. IMPRESSION: Sclerotic bone metastases are observed, similar in distribution to the prior nuclear medicine scintigraphy scan. No pathologic fracture. Electronically Signed   By: Staci Righter M.D.   On: 07/09/2015 19:07      IMPRESSION: The patient is a 69 year old male with a known history of hormone resistant prostate cancer with symptomatic pain in the bilateral in upper femur, more so on the left side. However, the bone scan and CT scan has showed more significant disease in the right upper femur. The patient would be a good candidate for palliative radiation therapy to these areas. Prior to proceeding with treatment, the patient would like to receive another bone scan for further evaluation of his worsening left leg pain. Patient will be seen after completion of his bone scan for further evaluation. Patient may also be a candidate for Radium-223 injections. I will discuss this with Dr.Shadad since the patient is currently receiving systemic chemotherapy.   PLAN:We discussed the possible side  effects and risks of treatment in addition to the possible benefits of treatment. We discussed the protocol for radiation treatment.  All of the patient's questions were answered. The patient does wish to proceed with this treatment.   I will order an additional bone scan considering patient's worsening left leg pain and interval since his last bone scan. Patient will follow-up soon after his bone scan for further evaluation. Anticipate treating both upper femur areas at this time with  consideration for radium 223 injections in addition at a later date.      ------------------------------------------------  Blair Promise, PhD, MD  This document serves as a record of services personally performed by Gery Pray, MD. It was created on his behalf by Derek Mound, a trained medical  scribe. The creation of this record is based on the scribe's personal observations and the provider's statements to them. This document has been checked and approved by the attending provider.   

## 2015-08-05 ENCOUNTER — Observation Stay (HOSPITAL_COMMUNITY)
Admission: EM | Admit: 2015-08-05 | Discharge: 2015-08-06 | Disposition: A | Discharge status: Home / Self Care | Payer: PPO | Attending: Internal Medicine | Admitting: Internal Medicine

## 2015-08-05 ENCOUNTER — Encounter (HOSPITAL_COMMUNITY): Payer: Self-pay | Admitting: Emergency Medicine

## 2015-08-05 ENCOUNTER — Emergency Department (HOSPITAL_COMMUNITY): Payer: PPO

## 2015-08-05 DIAGNOSIS — E440 Moderate protein-calorie malnutrition: Secondary | ICD-10-CM | POA: Insufficient documentation

## 2015-08-05 DIAGNOSIS — E1140 Type 2 diabetes mellitus with diabetic neuropathy, unspecified: Secondary | ICD-10-CM

## 2015-08-05 DIAGNOSIS — E119 Type 2 diabetes mellitus without complications: Secondary | ICD-10-CM | POA: Diagnosis not present

## 2015-08-05 DIAGNOSIS — C61 Malignant neoplasm of prostate: Secondary | ICD-10-CM | POA: Diagnosis present

## 2015-08-05 DIAGNOSIS — Z794 Long term (current) use of insulin: Secondary | ICD-10-CM | POA: Diagnosis not present

## 2015-08-05 DIAGNOSIS — G4700 Insomnia, unspecified: Secondary | ICD-10-CM | POA: Diagnosis present

## 2015-08-05 DIAGNOSIS — Z8546 Personal history of malignant neoplasm of prostate: Secondary | ICD-10-CM

## 2015-08-05 DIAGNOSIS — Z8673 Personal history of transient ischemic attack (TIA), and cerebral infarction without residual deficits: Secondary | ICD-10-CM

## 2015-08-05 DIAGNOSIS — D649 Anemia, unspecified: Principal | ICD-10-CM | POA: Insufficient documentation

## 2015-08-05 DIAGNOSIS — I5032 Chronic diastolic (congestive) heart failure: Secondary | ICD-10-CM | POA: Diagnosis not present

## 2015-08-05 DIAGNOSIS — Z7982 Long term (current) use of aspirin: Secondary | ICD-10-CM | POA: Diagnosis not present

## 2015-08-05 DIAGNOSIS — Z7984 Long term (current) use of oral hypoglycemic drugs: Secondary | ICD-10-CM | POA: Diagnosis not present

## 2015-08-05 DIAGNOSIS — IMO0002 Reserved for concepts with insufficient information to code with codable children: Secondary | ICD-10-CM | POA: Diagnosis present

## 2015-08-05 DIAGNOSIS — E1165 Type 2 diabetes mellitus with hyperglycemia: Secondary | ICD-10-CM

## 2015-08-05 DIAGNOSIS — Z79899 Other long term (current) drug therapy: Secondary | ICD-10-CM | POA: Diagnosis not present

## 2015-08-05 DIAGNOSIS — E785 Hyperlipidemia, unspecified: Secondary | ICD-10-CM

## 2015-08-05 DIAGNOSIS — I10 Essential (primary) hypertension: Secondary | ICD-10-CM | POA: Diagnosis not present

## 2015-08-05 DIAGNOSIS — R531 Weakness: Secondary | ICD-10-CM | POA: Diagnosis present

## 2015-08-05 DIAGNOSIS — C7951 Secondary malignant neoplasm of bone: Secondary | ICD-10-CM

## 2015-08-05 DIAGNOSIS — K219 Gastro-esophageal reflux disease without esophagitis: Secondary | ICD-10-CM | POA: Diagnosis present

## 2015-08-05 LAB — COMPREHENSIVE METABOLIC PANEL
ALBUMIN: 2.8 g/dL — AB (ref 3.5–5.0)
ALK PHOS: 307 U/L — AB (ref 38–126)
ALT: 25 U/L (ref 17–63)
AST: 44 U/L — AB (ref 15–41)
Anion gap: 9 (ref 5–15)
BILIRUBIN TOTAL: 0.7 mg/dL (ref 0.3–1.2)
BUN: 11 mg/dL (ref 6–20)
CALCIUM: 8.9 mg/dL (ref 8.9–10.3)
CO2: 27 mmol/L (ref 22–32)
CREATININE: 0.46 mg/dL — AB (ref 0.61–1.24)
Chloride: 102 mmol/L (ref 101–111)
GFR calc Af Amer: >60 mL/min (ref >60)
GFR calc non Af Amer: >60 mL/min (ref >60)
GLUCOSE: 311 mg/dL — AB (ref 65–99)
POTASSIUM: 3.6 mmol/L (ref 3.5–5.1)
Sodium: 138 mmol/L (ref 135–145)
TOTAL PROTEIN: 5.7 g/dL — AB (ref 6.5–8.1)

## 2015-08-05 LAB — HEPATIC FUNCTION PANEL
ALBUMIN: 2.7 g/dL — AB (ref 3.5–5.0)
ALK PHOS: 309 U/L — AB (ref 38–126)
ALT: 25 U/L (ref 17–63)
AST: 54 U/L — ABNORMAL HIGH (ref 15–41)
Bilirubin, Direct: <0.1 mg/dL — ABNORMAL LOW (ref 0.1–0.5)
Total Bilirubin: 0.7 mg/dL (ref 0.3–1.2)
Total Protein: 5.5 g/dL — ABNORMAL LOW (ref 6.5–8.1)

## 2015-08-05 LAB — CBC WITH DIFFERENTIAL/PLATELET
BASOS ABS: 0 10*3/uL (ref 0.0–0.1)
Basophils Relative: 0 %
EOS ABS: 0 10*3/uL (ref 0.0–0.7)
EOS PCT: 0 %
HEMATOCRIT: 20.3 % — AB (ref 39.0–52.0)
HEMOGLOBIN: 6.5 g/dL — AB (ref 13.0–17.0)
LYMPHS PCT: 16 %
Lymphs Abs: 1.6 10*3/uL (ref 0.7–4.0)
MCH: 28 pg (ref 26.0–34.0)
MCHC: 32 g/dL (ref 30.0–36.0)
MCV: 87.5 fL (ref 78.0–100.0)
MONO ABS: 0.4 10*3/uL (ref 0.1–1.0)
Monocytes Relative: 4 %
Neutro Abs: 8 10*3/uL — ABNORMAL HIGH (ref 1.7–7.7)
Neutrophils Relative %: 80 %
Platelets: 142 10*3/uL — ABNORMAL LOW (ref 150–400)
RBC: 2.32 MIL/uL — ABNORMAL LOW (ref 4.22–5.81)
RDW: 19 % — AB (ref 11.5–15.5)
WBC: 10 10*3/uL (ref 4.0–10.5)

## 2015-08-05 LAB — I-STAT TROPONIN, ED: Troponin i, poc: 0.01 ng/mL (ref 0.00–0.08)

## 2015-08-05 LAB — GLUCOSE, CAPILLARY
GLUCOSE-CAPILLARY: 260 mg/dL — AB (ref 65–99)
GLUCOSE-CAPILLARY: 75 mg/dL (ref 65–99)

## 2015-08-05 LAB — CBG MONITORING, ED: Glucose-Capillary: 208 mg/dL — ABNORMAL HIGH (ref 65–99)

## 2015-08-05 LAB — POC OCCULT BLOOD, ED: FECAL OCCULT BLD: NEGATIVE

## 2015-08-05 LAB — PREPARE RBC (CROSSMATCH)

## 2015-08-05 MED ORDER — OXYCODONE HCL ER 15 MG PO T12A
15.0000 mg | EXTENDED_RELEASE_TABLET | Freq: Two times a day (BID) | ORAL | Status: DC
  Administered 2015-08-05 – 2015-08-06 (×2): 15 mg via ORAL
  Filled 2015-08-05 (×2): qty 1

## 2015-08-05 MED ORDER — CLOPIDOGREL BISULFATE 75 MG PO TABS
75.0000 mg | ORAL_TABLET | Freq: Every day | ORAL | Status: DC
  Administered 2015-08-06: 75 mg via ORAL
  Filled 2015-08-05: qty 1

## 2015-08-05 MED ORDER — INSULIN GLARGINE 100 UNIT/ML ~~LOC~~ SOLN
12.0000 [IU] | Freq: Every day | SUBCUTANEOUS | Status: DC
  Administered 2015-08-05 – 2015-08-06 (×2): 12 [IU] via SUBCUTANEOUS
  Filled 2015-08-05 (×2): qty 0.12

## 2015-08-05 MED ORDER — ENSURE ENLIVE PO LIQD
237.0000 mL | Freq: Two times a day (BID) | ORAL | Status: DC
  Administered 2015-08-06 (×2): 237 mL via ORAL

## 2015-08-05 MED ORDER — OXYCODONE HCL 5 MG PO TABS
5.0000 mg | ORAL_TABLET | Freq: Four times a day (QID) | ORAL | Status: DC | PRN
  Administered 2015-08-05: 5 mg via ORAL
  Filled 2015-08-05: qty 1

## 2015-08-05 MED ORDER — FUROSEMIDE 10 MG/ML IJ SOLN
40.0000 mg | Freq: Once | INTRAMUSCULAR | Status: AC
  Administered 2015-08-05: 40 mg via INTRAVENOUS
  Filled 2015-08-05: qty 4

## 2015-08-05 MED ORDER — INSULIN ASPART 100 UNIT/ML ~~LOC~~ SOLN: 10.0000 [IU] | Freq: Once | SUBCUTANEOUS | Status: DC

## 2015-08-05 MED ORDER — LOSARTAN POTASSIUM 50 MG PO TABS
50.0000 mg | ORAL_TABLET | Freq: Every day | ORAL | Status: DC
  Administered 2015-08-06: 50 mg via ORAL
  Filled 2015-08-05: qty 1

## 2015-08-05 MED ORDER — ACETAMINOPHEN 650 MG RE SUPP: 650.0000 mg | Freq: Four times a day (QID) | RECTAL | Status: DC | PRN

## 2015-08-05 MED ORDER — BISACODYL 10 MG RE SUPP
10.0000 mg | Freq: Every day | RECTAL | Status: DC
  Administered 2015-08-06: 10 mg via RECTAL
  Filled 2015-08-05: qty 1

## 2015-08-05 MED ORDER — SODIUM CHLORIDE 0.9 % IV BOLUS (SEPSIS)
1000.0000 mL | Freq: Once | INTRAVENOUS | Status: AC
  Administered 2015-08-05: 1000 mL via INTRAVENOUS

## 2015-08-05 MED ORDER — ONDANSETRON HCL 4 MG/2ML IJ SOLN: 4.0000 mg | Freq: Four times a day (QID) | INTRAMUSCULAR | Status: DC | PRN

## 2015-08-05 MED ORDER — SODIUM CHLORIDE 0.9 % IJ SOLN: 3.0000 mL | INTRAMUSCULAR | Status: DC | PRN

## 2015-08-05 MED ORDER — TRAZODONE HCL 50 MG PO TABS
25.0000 mg | ORAL_TABLET | Freq: Every evening | ORAL | Status: DC | PRN
Start: 2015-08-05 — End: 2015-08-06
  Administered 2015-08-05: 25 mg via ORAL
  Filled 2015-08-05: qty 1

## 2015-08-05 MED ORDER — PANTOPRAZOLE SODIUM 40 MG PO TBEC
40.0000 mg | DELAYED_RELEASE_TABLET | Freq: Every day | ORAL | Status: DC
  Administered 2015-08-06: 40 mg via ORAL
  Filled 2015-08-05: qty 1

## 2015-08-05 MED ORDER — SODIUM CHLORIDE 0.9 % IJ SOLN
3.0000 mL | Freq: Two times a day (BID) | INTRAMUSCULAR | Status: DC
Start: 2015-08-05 — End: 2015-08-06

## 2015-08-05 MED ORDER — ASPIRIN 81 MG PO CHEW
81.0000 mg | CHEWABLE_TABLET | Freq: Every day | ORAL | Status: DC
  Administered 2015-08-06: 81 mg via ORAL
  Filled 2015-08-05: qty 1

## 2015-08-05 MED ORDER — SODIUM CHLORIDE 0.9 % IV SOLN: 250.0000 mL | INTRAVENOUS | Status: DC | PRN

## 2015-08-05 MED ORDER — SENNOSIDES-DOCUSATE SODIUM 8.6-50 MG PO TABS
1.0000 | ORAL_TABLET | Freq: Two times a day (BID) | ORAL | Status: DC
  Administered 2015-08-05 – 2015-08-06 (×2): 1 via ORAL
  Filled 2015-08-05 (×2): qty 1

## 2015-08-05 MED ORDER — ATORVASTATIN CALCIUM 40 MG PO TABS: 40.0000 mg | ORAL_TABLET | Freq: Every day | ORAL | Status: DC

## 2015-08-05 MED ORDER — SODIUM CHLORIDE 0.9 % IV SOLN
10.0000 mL/h | Freq: Once | INTRAVENOUS | Status: AC
  Administered 2015-08-05: 10 mL/h via INTRAVENOUS

## 2015-08-05 MED ORDER — ACETAMINOPHEN 325 MG PO TABS
650.0000 mg | ORAL_TABLET | Freq: Four times a day (QID) | ORAL | Status: DC | PRN
  Administered 2015-08-05: 650 mg via ORAL
  Filled 2015-08-05: qty 2

## 2015-08-05 MED ORDER — ONDANSETRON HCL 4 MG PO TABS: 4.0000 mg | ORAL_TABLET | Freq: Four times a day (QID) | ORAL | Status: DC | PRN

## 2015-08-05 MED ORDER — CHOLECALCIFEROL 10 MCG (400 UNIT) PO TABS
400.0000 [IU] | ORAL_TABLET | Freq: Every day | ORAL | Status: DC
Start: 2015-08-06 — End: 2015-08-06
  Administered 2015-08-06: 400 [IU] via ORAL
  Filled 2015-08-05: qty 1

## 2015-08-05 MED ORDER — INSULIN ASPART 100 UNIT/ML ~~LOC~~ SOLN
0.0000 [IU] | Freq: Three times a day (TID) | SUBCUTANEOUS | Status: DC
  Administered 2015-08-05: 5 [IU] via SUBCUTANEOUS
  Administered 2015-08-06: 2 [IU] via SUBCUTANEOUS
  Administered 2015-08-06: 5 [IU] via SUBCUTANEOUS

## 2015-08-05 MED ORDER — DEXAMETHASONE 4 MG PO TABS
4.0000 mg | ORAL_TABLET | Freq: Every day | ORAL | Status: DC
  Administered 2015-08-05 – 2015-08-06 (×2): 4 mg via ORAL
  Filled 2015-08-05 (×2): qty 1

## 2015-08-05 NOTE — ED Notes (Signed)
Attempted to call report to 3W.  Was informed Rn will call back & provided receiving RN's # of [phone].

## 2015-08-05 NOTE — ED Notes (Addendum)
Patient treated for pneumonia 2 weeks ago at [place] discharged to [facility] for one week, then came home yesterday.  Patient lives alone and felt like he was going to pass out and complained of weakness, nausea, and lack of appetite.  Patient alert and oriented, denies pain, nausea, vomiting.  Patient is being treated for prostate cancer and had chemo about one month ago.  Patient has a portacath.  [id number].

## 2015-08-05 NOTE — ED Notes (Signed)
Nurse drawing labs. 

## 2015-08-05 NOTE — H&P (Signed)
[facility] History and Physical  Ricky Terry [id number] DOB: Oct 17, 1945 DOA: 08/05/2015  Referring physician: Dr. Stark Terry PCP: Ricky Mccreedy, MD   Chief Complaint: Increased fatigue, lightheadedness, weakness  HPI: Ricky Terry is a 69 y.o. 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 Patient was recently admitted to the hospital due to pneumonia and discharge to a skilled nursing facility for rehabilitation, he has approximately 1 week of returning home; and reports that for the last 2 days or so has been feeling weak, with increased fatigue and lightheaded when changing positions. Patient denies any chest pain, fever, nausea, vomiting, melena, hematochezia, hematuria, dysuria, abdominal pain, headaches, blurred vision or any focal weakness. In the ED a chest x-ray has demonstrated no active cardiopulmonary disease, and his blood work was mainly significant for a hemoglobin of 6.5 and hyperglycemia (CBGs 300 range). Patient has a negative FOBT and sinus tachycardia. Echo lood test negative.   Patient received fluid resuscitation, was type and screen and [facility] contacted admit the patient for blood transfusion and further evaluation/treatment.    Review of Systems:  Negative except as otherwise mentioned on history of present illness.  Past Medical History  Diagnosis Date  . Stroke (Ricky Terry)   . Diabetes mellitus   . Hypertension   . Prostate cancer ([place])   . Bone metastases Ricky Terry)    Past Surgical History  Procedure Laterality Date  . Eye surgery     Social History:   reports that he has never smoked. He does not have any smokeless tobacco history on file. He reports that he does not drink alcohol or use illicit drugs.  No Known Allergies  Family History  Problem Relation Age of Onset  . Cancer Mother   . Hypertension Mother   . Hypertension Father   . Cancer Father   . Diabetes Sister   . Hypertension Sister   . Hypertension Brother   . Diabetes Brother     Prior to Admission medications   Medication Sig Start Date End Date Taking? Authorizing Provider  aspirin 81 MG chewable tablet Chew 1 tablet (81 mg total) by mouth daily. 06/14/15  Yes Verlee Monte, MD  atorvastatin (LIPITOR) 40 MG tablet Take 1 tablet (40 mg total) by mouth at bedtime. 06/14/15  Yes Verlee Monte, MD  bisacodyl (DULCOLAX) 10 MG suppository Place 1 suppository (10 mg total) rectally daily. 07/19/15  Yes Theodis Blaze, MD  cholecalciferol (VITAMIN D) 400 UNITS TABS tablet Take 400 Units by mouth daily.   Yes Historical Provider, MD  clopidogrel (PLAVIX) 75 MG tablet Take 1 tablet (75 mg total) by mouth daily with breakfast. 06/14/15  Yes Verlee Monte, MD  dexamethasone (DECADRON) 4 MG tablet Take 1 tablet (4 mg total) by mouth 3 (three) times daily. 07/19/15  Yes Theodis Blaze, MD  glipiZIDE (GLUCOTROL) 10 MG tablet Take 1 tablet (10 mg total) by mouth daily before breakfast. Patient taking differently: Take 10 mg by mouth 2 (two) times daily.  06/14/15  Yes Verlee Monte, MD  insulin aspart (NOVOLOG) 100 UNIT/ML injection Inject 0-20 Units into the skin 3 (three) times daily with meals. 07/19/15  Yes Theodis Blaze, MD  insulin glargine (LANTUS) 100 UNIT/ML injection Inject 0.05  mLs (5 Units total) into the skin at bedtime. 07/19/15  Yes Theodis Blaze, MD  lidocaine-prilocaine (EMLA) cream Apply 1 application topically as needed. 05/23/15  Yes Wyatt Portela, MD  losartan (COZAAR) 50 MG tablet Take 1 tablet (50 mg total) by mouth daily. 06/14/15  Yes Verlee Monte, MD  metFORMIN  (GLUCOPHAGE) 1000 MG tablet Take 1 tablet (1,000 mg total) by mouth 2 (two) times daily. 06/14/15  Yes Verlee Monte, MD  oxyCODONE (OXY IR/ROXICODONE) 5 MG immediate release tablet Take 1 tablet (5 mg total) by mouth every 4 (four) hours as needed for moderate pain. 07/19/15  Yes Theodis Blaze, MD  prochlorperazine (COMPAZINE) 10 MG tablet Take 1 tablet (10 mg total) by mouth every 6 (six) hours as needed for nausea or vomiting. 06/28/15  Yes Wyatt Portela, MD  senna-docusate (SENOKOT-S) 8.6-50 MG tablet Take 1 tablet by mouth 2 (two) times daily. 07/16/15  Yes Theodis Blaze, MD  traZODone (DESYREL) 50 MG tablet Take 0.5 tablets (25 mg total) by mouth at bedtime as needed for sleep. 07/19/15  Yes Theodis Blaze, MD  oxyCODONE (OXYCONTIN) 15 mg 12 hr tablet Take 1 tablet (15 mg total) by mouth every 12 (twelve) hours. 07/19/15   Theodis Blaze, MD   Physical Exam: Filed Vitals:   08/05/15 1343 08/05/15 1601  BP: 164/67 162/66  Pulse: 114 112  Temp: 97.9 F (36.6 C) 98.3 F (36.8 C)  TempSrc: Oral Oral  Resp: 20 21  SpO2: 100% 100%    Wt Readings from Last 3 Encounters:  08/02/15 55.566 kg (122 lb 8 oz)  07/19/15 59.467 kg (131 lb 1.6 oz)  07/11/15 56.7 kg (125 lb)    General:  Appears calm and comfortable; patient denies chest pain and shortness of breath. He appears to be in no acute distress. After fluid resuscitation given in the ED reports no lightheadedness sensation. Eyes: PERRL, normal lids, irises & conjunctiva, no icterus, no nystagmus ENT: grossly normal hearing, slightly dry mucous membranes, no erythema or exudates, no thrush appreciated. There is no drainage out of his ears or nostrils Neck: no LAD, masses or thyromegaly, no JVD Cardiovascular: S1 and S2, sinus tachycardia; no rubs, no gallops, no murmurs Telemetry: SR, no arrhythmias appreciated on telemetry box while in the emergency department Respiratory: CTA bilaterally, no w/r/r. Normal respiratory effort. Abdomen:  soft, nontender, nondistended, positive bowel sounds, no guarding Skin: no rash or induration seen on exam; right Port-A-Cath in place (no signs of infection appreciated) Musculoskeletal: grossly normal tone BUE/BLE; trace edema bilaterally Psychiatric: grossly normal mood and affect, speech fluent and appropriate Neurologic: grossly non-focal.          Labs on Admission:  Basic Metabolic Panel:  Recent Labs Lab 08/05/15 1428  NA 138  K 3.6  CL 102  CO2 27  GLUCOSE 311*  BUN 11  CREATININE 0.46*  CALCIUM 8.9   Liver Function Tests:  Recent Labs Lab 08/05/15 1428  AST 44*  ALT 25  ALKPHOS 307*  BILITOT 0.7  PROT 5.7*  ALBUMIN 2.8*   CBC:  Recent Labs Lab 08/05/15 1428  WBC 10.0  NEUTROABS 8.0*  HGB 6.5*  HCT 20.3*  MCV 87.5  PLT 142*   CBG:  Recent Labs Lab 08/05/15 1551  GLUCAP 208*    Radiological Exams on Admission: Dg Chest 2 View  08/05/2015  CLINICAL DATA:  69 year old male with weakness and lethargy today. Patient on chemotherapy. EXAM: CHEST  2 VIEW COMPARISON:  07/12/2015 and prior exams FINDINGS: The cardiomediastinal silhouette is unremarkable. A right IJ Port-A-Cath is present with tip overlying the mid SVC. There is no evidence of focal airspace disease, pulmonary edema, suspicious pulmonary nodule/mass, pleural effusion, or pneumothorax. No acute bony abnormalities are identified. IMPRESSION: No active cardiopulmonary disease. Electronically Signed   By: Margarette Canada M.D.   On: 08/05/2015 15:09    EKG:  Sinus tachycardia, nonspecific T-wave abnormalities, normal axis. No acute ischemic changes appreciated.  Assessment/Plan 1-Symptomatic anemia: Appears to be secondary to anemia induced by chemotherapy -Fecal occult blood test was negative -Patient will be admitted to [facility] and received 2 units of PRBCs -Will follow clinical response and monitor hemoglobin trend -Patient overall hemodynamically stable  2-DM type 2, uncontrolled, with  neuropathy ([place]): -Will check hemoglobin A1c -Will treat with sliding scale insulin and Lantus -Holding oral hypoglycemic agents while inpatient  3-Chronic diastolic CHF (congestive heart failure), NYHA class 1 (HCC) -Compensated -Last ejection fraction preserved (65-70% October 2016) -Will continue low sodium diet -Will check patient's daily weight and intake/output  4-Neoplasm of prostate, distant metastasis staging category M1b: metastasis to bone [facility]) -Oncology has been informed of patient admission -Will continue outpatient follow-up with oncology service for further decision on his treatment -His actively receiving chemotherapy (last cycle approximately a month ago).   5-Benign essential HTN: Will continue the use of Cozaar -will Adjust antihypertensive regimen as needed  6-Hx of arterial ischemic stroke: No acute focal neurologic deficit  -Will continue aspirin and Plavix for secondary prevention   7-HLD (hyperlipidemia): Continue statins   8-Insomnia: Continue as needed trazodone   9-GERD without esophagitis: Continue PPI   10-thrombocytopenia: Due to malignancy and chemotherapeutic agents -No further bleeding appreciated -Will follow-up platelets trend   11-chronic pain syndrome: Associated with metastatic lesions towards his bones from prostate cancer -Will continue OxyContin every 12 hours and oxycodone for breakthrough and moderate pain  Oncology service (informed of admission through EPIC; Dr. Alen Blew)  Code Status: Full Code DVT Prophylaxis:SCD's Family Communication: no family at bedside Disposition Plan: admit to med-surg; observation; LOS < 2 midnights   Time spent: 65 minutes  [place], [facility] Pager [phone]

## 2015-08-05 NOTE — ED Notes (Signed)
Patient here from nursing facility after being d/c'd from hospital last week for RL PNA.  Patient complains of dizziness today.  Patient denies N/V/D, fever and SOB.  Lung sounds are CTAB, no wheezing, rhonchi or rales.  Heart sounds WNL, S1/S2, no murmur, rub or gallop, +2 radial pulses bilaterally.  Patient's abdomen soft and non-tender to palpation.  Patient denies abdominal pain with palpation and movement.

## 2015-08-05 NOTE — ED Provider Notes (Signed)
CSN: [id number]     Arrival date & time 08/05/15  1331 History   First MD Initiated Contact with Patient 08/05/15 1359     Chief Complaint  Patient presents with  . Weakness     (Consider location/radiation/quality/duration/timing/severity/associated sxs/prior Treatment) HPI Comments: Patient is a 69 year old male with history of prostate cancer, HTN, DM, and recent admission for pneumonia.  He presents for evaluation of weakness and fatigue that started earlier today. He denies any specific complaints such as chest pain, shortness of breath, abdominal pain, vomiting, or diarrhea. He denies any fevers or chills. He lives by himself and is concerned about the way he is feeling.  Patient is a 68 y.o. male presenting with weakness. The history is provided by the patient.  Weakness This is a new problem. Episode onset: Today. The problem occurs constantly. The problem has been rapidly worsening. Pertinent negatives include no chest pain and no abdominal pain. Nothing aggravates the symptoms. Nothing relieves the symptoms. He has tried nothing for the symptoms.    Past Medical History  Diagnosis Date  . Stroke ([place])   . Diabetes mellitus   . Hypertension   . Prostate cancer ([place])   . Bone metastases [facility])    Past Surgical History  Procedure Laterality Date  . Eye surgery     Family History  Problem Relation Age of Onset  . Cancer Mother   . Hypertension Mother   . Hypertension Father   . Cancer Father   . Diabetes Sister   . Hypertension Sister   . Hypertension Brother   . Diabetes Brother    Social History  Substance Use Topics  . Smoking status: Never Smoker   . Smokeless tobacco: None  . Alcohol Use: No    Review of Systems  Cardiovascular: Negative for chest pain.  Gastrointestinal: Negative for abdominal pain.  Neurological: Positive for weakness.      Allergies  Review of patient's allergies indicates no known allergies.  Home Medications   Prior to  Admission medications   Medication Sig Start Date End Date Taking? Authorizing Provider  aspirin 81 MG chewable tablet Chew 1 tablet (81 mg total) by mouth daily. 06/14/15   Verlee Monte, MD  atorvastatin (LIPITOR) 40 MG tablet Take 1 tablet (40 mg total) by mouth at bedtime. 06/14/15   Verlee Monte, MD  bisacodyl (DULCOLAX) 10 MG suppository Place 1 suppository (10 mg total) rectally daily. 07/19/15   Theodis Blaze, MD  cholecalciferol (VITAMIN D) 400 UNITS TABS tablet Take 400 Units by mouth daily.    Historical Provider, MD  clopidogrel (PLAVIX) 75 MG tablet Take 1 tablet (75 mg total) by mouth daily with breakfast. 06/14/15   Verlee Monte, MD  dexamethasone (DECADRON) 4 MG tablet Take 1 tablet (4 mg total) by mouth 3 (three) times daily. 07/19/15   Theodis Blaze, MD  glipiZIDE (GLUCOTROL) 10 MG tablet Take 1 tablet (10 mg total) by mouth daily before breakfast. 06/14/15   Verlee Monte, MD  insulin aspart (NOVOLOG) 100 UNIT/ML injection Inject 0-20 Units into the skin 3 (three) times daily with meals. 07/19/15   Theodis Blaze, MD  insulin glargine (LANTUS) 100 UNIT/ML injection Inject 0.05 mLs (5 Units total) into the skin at bedtime. 07/19/15   Theodis Blaze, MD  lidocaine-prilocaine (EMLA) cream Apply 1 application topically as needed. 05/23/15   Wyatt Portela, MD  losartan (COZAAR) 50 MG tablet Take 1 tablet (50 mg total) by mouth daily. 06/14/15  Verlee Monte, MD  metFORMIN (GLUCOPHAGE) 1000 MG tablet Take 1 tablet (1,000 mg total) by mouth 2 (two) times daily. 06/14/15   Verlee Monte, MD  oxyCODONE (OXY IR/ROXICODONE) 5 MG immediate release tablet Take 1 tablet (5 mg total) by mouth every 4 (four) hours as needed for moderate pain. 07/19/15   Theodis Blaze, MD  oxyCODONE (OXYCONTIN) 15 mg 12 hr tablet Take 1 tablet (15 mg total) by mouth every 12 (twelve) hours. 07/19/15   Theodis Blaze, MD  prochlorperazine (COMPAZINE) 10 MG tablet Take 1 tablet (10 mg total) by mouth every 6 (six) hours as  needed for nausea or vomiting. Patient not taking: Reported on 08/02/2015 06/28/15   Wyatt Portela, MD  senna-docusate (SENOKOT-S) 8.6-50 MG tablet Take 1 tablet by mouth 2 (two) times daily. 07/16/15   Theodis Blaze, MD  traZODone (DESYREL) 50 MG tablet Take 0.5 tablets (25 mg total) by mouth at bedtime as needed for sleep. 07/19/15   Theodis Blaze, MD   BP 164/67 mmHg  Pulse 114  Temp(Src) 97.9 F (36.6 C) (Oral)  Resp 20  SpO2 100% Physical Exam  Constitutional: He is oriented to person, place, and time. He appears well-developed and well-nourished. No distress.  HENT:  Head: Normocephalic and atraumatic.  Mouth/Throat: Oropharynx is clear and moist.  Neck: Normal range of motion. Neck supple.  Cardiovascular: Normal rate, regular rhythm and normal heart sounds.   No murmur heard. Pulmonary/Chest: Effort normal and breath sounds normal. No respiratory distress. He has no wheezes. He has no rales.  Abdominal: Soft. Bowel sounds are normal. He exhibits no distension. There is no tenderness. There is no rebound.  Musculoskeletal: Normal range of motion. He exhibits no edema.  Neurological: He is alert and oriented to person, place, and time.  Skin: Skin is warm and dry. He is not diaphoretic.  Nursing note and vitals reviewed.   ED Course  Procedures (including critical care time) Labs Review Labs Reviewed  COMPREHENSIVE METABOLIC PANEL  CBC WITH DIFFERENTIAL/PLATELET  I-STAT TROPOININ, ED    Imaging Review No results found. I have personally reviewed and evaluated these images and lab results as part of my medical decision-making.   EKG Interpretation   Date/Time:  Saturday August 05 2015 13:45:16 EST Ventricular Rate:  113 PR Interval:  127 QRS Duration: 77 QT Interval:  298 QTC Calculation: 408 R Axis:   59 Text Interpretation:  Sinus tachycardia Ventricular premature complex  Nonspecific T abnormalities, diffuse leads Borderline ST elevation,  anterior leads  Confirmed by Veronica Fretz  MD, Jakye Mullens ([id number]) on 08/05/2015  3:41:08 PM      MDM   Final diagnoses:  None    Patient presents with complaints of weakness. His workup reveals a hemoglobin of 6.5 and glucose of 311, however the remainder the laboratory studies are otherwise unremarkable. He is symptomatic with this low hemoglobin and I feel will require transfusion. He has been typed and crossed for 2 units. Have spoken with Dr. Dyann Kief from the hospitalist service who agrees to admit.    Veryl Speak, MD 08/05/15 [phone]

## 2015-08-05 NOTE — ED Notes (Signed)
Bed: [id number] Expected date: 08/05/15 Expected time: 1:28 PM Means of arrival: Ambulance Comments: Dizziness, Hyperglycemia

## 2015-08-06 DIAGNOSIS — E440 Moderate protein-calorie malnutrition: Secondary | ICD-10-CM | POA: Insufficient documentation

## 2015-08-06 DIAGNOSIS — I5032 Chronic diastolic (congestive) heart failure: Secondary | ICD-10-CM | POA: Diagnosis not present

## 2015-08-06 DIAGNOSIS — D649 Anemia, unspecified: Secondary | ICD-10-CM | POA: Diagnosis not present

## 2015-08-06 DIAGNOSIS — I10 Essential (primary) hypertension: Secondary | ICD-10-CM | POA: Diagnosis not present

## 2015-08-06 DIAGNOSIS — E1140 Type 2 diabetes mellitus with diabetic neuropathy, unspecified: Secondary | ICD-10-CM | POA: Diagnosis not present

## 2015-08-06 LAB — BASIC METABOLIC PANEL
Anion gap: 7 (ref 5–15)
BUN: 9 mg/dL (ref 6–20)
CO2: 30 mmol/L (ref 22–32)
CREATININE: 0.45 mg/dL — AB (ref 0.61–1.24)
Calcium: 8.6 mg/dL — ABNORMAL LOW (ref 8.9–10.3)
Chloride: 102 mmol/L (ref 101–111)
Glucose, Bld: 208 mg/dL — ABNORMAL HIGH (ref 65–99)
Potassium: 4.1 mmol/L (ref 3.5–5.1)
SODIUM: 139 mmol/L (ref 135–145)

## 2015-08-06 LAB — TYPE AND SCREEN
ABO/RH(D): A POS
Antibody Screen: NEGATIVE
Unit division: 0
Unit division: 0

## 2015-08-06 LAB — GLUCOSE, CAPILLARY
GLUCOSE-CAPILLARY: 153 mg/dL — AB (ref 65–99)
GLUCOSE-CAPILLARY: 299 mg/dL — AB (ref 65–99)

## 2015-08-06 MED ORDER — HEPARIN SOD (PORK) LOCK FLUSH 100 UNIT/ML IV SOLN: 500.0000 [IU] | INTRAVENOUS | Status: DC

## 2015-08-06 MED ORDER — POLYSACCHARIDE IRON COMPLEX 150 MG PO CAPS
150.0000 mg | ORAL_CAPSULE | Freq: Every day | ORAL | Status: DC
  Administered 2015-08-06: 150 mg via ORAL
  Filled 2015-08-06: qty 1

## 2015-08-06 MED ORDER — HEPARIN SOD (PORK) LOCK FLUSH 100 UNIT/ML IV SOLN
500.0000 [IU] | INTRAVENOUS | Status: DC | PRN
  Filled 2015-08-06: qty 5

## 2015-08-06 MED ORDER — POLYSACCHARIDE IRON COMPLEX 150 MG PO CAPS: 150.0000 mg | ORAL_CAPSULE | Freq: Every day | ORAL | Status: AC

## 2015-08-06 MED ORDER — INSULIN DETEMIR 100 UNIT/ML FLEXPEN: 10.0000 [IU] | PEN_INJECTOR | Freq: Every day | SUBCUTANEOUS | Status: DC

## 2015-08-06 MED ORDER — ENSURE ENLIVE PO LIQD: 237.0000 mL | Freq: Two times a day (BID) | ORAL | Status: AC

## 2015-08-06 NOTE — Progress Notes (Addendum)
CSW spoke with pt concerning recent SNF stay- pt reports getting short term rehab then returning home for one day prior to readmission to the hospital.  Pt states coming back to the hospital was his fault because he did not have someone there 24 hours as suggested- pt states when he DCs this time he will have his girlfriend or one of his 2 sons there at all times to assist if needed.  Pt is not agreeable to returning to SNF if it is recommended. CSW informed MD of conversation and pt concerns about not being walked so far here in the hospital.  [place] signing off at this time.  Domenica Reamer, [place] Clinical Social Worker [phone]

## 2015-08-06 NOTE — Discharge Summary (Signed)
Physician Discharge Summary  Ricky Terry [id number] DOB: [birth year] DOA: 08/05/2015  PCP: Ricky Mccreedy, MD  Admit date: 08/05/2015 Discharge date: 08/06/2015  Time spent: 35 minutes  Recommendations for Outpatient Follow-up:  1. Follow A1C; pending at discharge 2. Repeat CBC to follow Hgb and Platelets trend 3. Follow BMET to assess electrolytes and renal function 4. Close follow up to CBG's and further adjust his hypoglycemic regimen as needed  5. 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 Will follow up with PCP in 1 week. Will also follow with Oncology service as previously instructed.  Diet recommendation: low carbohydrates and heart healthy diet   Filed Weights   08/05/15 1653 08/06/15 0428  Weight: 56.9 kg (125 lb 7.1 oz) 56.5 kg (124 lb 9 oz)    History of present illness:  69 y.o. 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 Patient was recently admitted to the hospital due to pneumonia and discharge to a skilled nursing facility for rehabilitation, he has  approximately 1 week of returning home; and reports that for the last 2 days or so has been feeling weak, with increased fatigue and lightheaded when changing positions. Patient denies any chest pain, fever, nausea, vomiting, melena, hematochezia, hematuria, dysuria, abdominal pain, headaches, blurred vision or any focal weakness. In the ED a chest x-ray has demonstrated no active cardiopulmonary disease, and his blood work was mainly significant for a hemoglobin of 6.5 and hyperglycemia (CBGs 300 range). Patient has a negative FOBT and sinus tachycardia. Echo lood test negative.   Hospital Course:  1-Symptomatic anemia: Appears to be secondary to anemia induced by chemotherapy  -Fecal occult blood test was negative -Patient was admitted to [facility] and received 2 units of PRBCs; asymptomatic at discharge and feeling much better -at discharge Hgb 8.7 -Patient overall hemodynamically stable  2-DM type 2, uncontrolled, with neuropathy ([place]): -hemoglobin A1c pending at discharge -patient with trouble paying for lantus -will discharge on SSI, prescription for levemir and resume his metformin and glipizide -outpatient follow up with PCP for further adjustments on his hypoglycemic regimen   3-Chronic diastolic CHF (congestive heart failure), NYHA class 1 ([place]) -Compensated -Last ejection fraction preserved (65-70% October 2016) -Will advise to follow low sodium diet -also instructed to keep track of his weight   4-Neoplasm of prostate, 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   5-Benign essential HTN:  -advise to follow low sodium heart healthy diet -continue home antihypertensive regimen   6-Hx of arterial ischemic stroke: No acute focal neurologic deficit  -Will continue aspirin and Plavix for  secondary prevention  -patient with  neg FOBT  7-HLD (hyperlipidemia): Continue statins   8-Insomnia: Continue as needed trazodone   9-GERD without esophagitis: Continue PPI   10-thrombocytopenia: Due to malignancy and chemotherapy -No overt bleeding appreciated -Will recommend CBC to follow-up platelets trend   11-chronic pain syndrome: Associated with metastatic lesions towards his bones from prostate cancer -Will continue OxyContin every 12 hours and oxycodone for breakthrough and moderate pain  12-hypoalbuminemia: moderate protein calorie malnutrition  -continue ensure live  Procedures:  See below for x-ray reports   Consultations:  None   Discharge Exam: Filed Vitals:   08/06/15 0428 08/06/15 0924  BP: 144/57 139/62  Pulse: 103 101  Temp: 98.6 F (37 C)   Resp: 18 20    General:afebrile, denies CP, SOB and any further lightheadedness sensation. Neg orthostatic changes Cardiovascular: S1 and S2, no rubs or gallops Respiratory: CTA bilaterally  Abd: soft, NT, ND, positive BS Neuro: non focal deficit   Discharge Instructions   Discharge Instructions    Discharge instructions    Complete by:  As directed   Keep yourself well hydrated Take medications as prescribed Follow with PCP in 1 week Please contact cancer center for appointment details with Dr. Alen Blew  Keep yourself well hydrated Follow a low carbohydrates and heart healthy diet          Current Discharge Medication List    START taking these medications   Details  feeding supplement, ENSURE ENLIVE, (ENSURE ENLIVE) LIQD Take 237 mLs by mouth 2 (two) times daily between meals. Qty: 237 mL, Refills: 12    Insulin Detemir (LEVEMIR) 100 UNIT/ML Pen Inject 10 Units into the skin daily. Qty: 15 mL, Refills: 5    iron polysaccharides (NIFEREX) 150 MG capsule Take 1 capsule (150 mg total) by mouth daily. Qty: 30 capsule, Refills: 2      CONTINUE these medications which have NOT CHANGED   Details   aspirin 81 MG chewable tablet Chew 1 tablet (81 mg total) by mouth daily. Qty: 30 tablet, Refills: 0    atorvastatin (LIPITOR) 40 MG tablet Take 1 tablet (40 mg total) by mouth at bedtime. Qty: 30 tablet, Refills: 0    bisacodyl (DULCOLAX) 10 MG suppository Place 1 suppository (10 mg total) rectally daily. Qty: 12 suppository, Refills: 0    cholecalciferol (VITAMIN D) 400 UNITS TABS tablet Take 400 Units by mouth daily.    clopidogrel (PLAVIX) 75 MG tablet Take 1 tablet (75 mg total) by mouth daily with breakfast. Qty: 30 tablet, Refills: 1    dexamethasone (DECADRON) 4 MG tablet Take 1 tablet (4 mg total) by mouth 3 (three) times daily. Qty: 21 tablet, Refills: 1    glipiZIDE (GLUCOTROL) 10 MG tablet Take 1 tablet (10 mg total) by mouth daily before breakfast. Qty: 30 tablet, Refills: 0    insulin aspart (NOVOLOG) 100 UNIT/ML injection Inject 0-20 Units into the skin 3 (three) times daily with meals. Qty: 10 mL, Refills: 11    lidocaine-prilocaine (EMLA) cream Apply 1 application topically as needed. Qty: 30 g, Refills: 0    losartan (COZAAR) 50 MG tablet Take 1 tablet (50 mg total) by mouth daily. Qty: 30 tablet, Refills: 0    metFORMIN (GLUCOPHAGE) 1000 MG tablet Take 1 tablet (1,000 mg total) by mouth 2 (two) times daily. Qty: 30 tablet, Refills: 0    oxyCODONE (OXY IR/ROXICODONE) 5 MG immediate release tablet Take 1 tablet (5 mg total) by mouth every 4 (four) hours as needed for moderate pain. Qty: 30 tablet,  Refills: 0    prochlorperazine (COMPAZINE) 10 MG tablet Take 1 tablet (10 mg total) by mouth every 6 (six) hours as needed for nausea or vomiting. Qty: 30 tablet, Refills: 1    senna-docusate (SENOKOT-S) 8.6-50 MG tablet Take 1 tablet by mouth 2 (two) times daily. Qty: 60 tablet, Refills: 0    traZODone (DESYREL) 50 MG tablet Take 0.5 tablets (25 mg total) by mouth at bedtime as needed for sleep. Qty: 30 tablet, Refills: 0    oxyCODONE (OXYCONTIN) 15 mg 12 hr  tablet Take 1 tablet (15 mg total) by mouth every 12 (twelve) hours. Qty: 60 tablet, Refills: 0      STOP taking these medications     insulin glargine (LANTUS) 100 UNIT/ML injection        No Known Allergies Follow-up Information    Follow up with [name],GEORGE, MD. Schedule an appointment as soon as possible for a visit in 1 week.   Specialty:  Internal Medicine   Contact information:   [id number] [place] [address] [phone]       Call [facility], MD.   Specialty:  Oncology   Why:  [facility] for appointment details    Contact information:   [address] [place] [phone]       The results of significant diagnostics from this hospitalization (including imaging, microbiology, ancillary and laboratory) are listed below for reference.    Significant Diagnostic Studies: Dg Chest 2 View  08/05/2015  CLINICAL DATA:  69 year old male with weakness and lethargy today. Patient on chemotherapy. EXAM: CHEST  2 VIEW COMPARISON:  07/12/2015 and prior exams FINDINGS: The cardiomediastinal silhouette is unremarkable. A right IJ Port-A-Cath is present with tip overlying the mid SVC. There is no evidence of focal airspace disease, pulmonary edema, suspicious pulmonary nodule/mass, pleural effusion, or pneumothorax. No acute bony abnormalities are identified. IMPRESSION: No active cardiopulmonary disease. Electronically Signed   By: Margarette Canada M.D.   On: 08/05/2015 15:09   Dg Chest 2 View  07/12/2015  CLINICAL DATA:  Two day history of productive cough. Hypertension. History prostate carcinoma. EXAM: CHEST  2 VIEW COMPARISON:  Chest radiograph June 12, 2015; chest CT June 13, 2015 FINDINGS: There is patchy infiltrate in the right lower lobe. The lungs elsewhere are clear. Heart size and pulmonary vascularity are normal. No adenopathy. Port-A-Cath tip is in the superior vena cava. No pneumothorax. Irregular sclerosis in the left  humeral head region is stable. Sclerosis in the T7 vertebral body is likewise stable. IMPRESSION: Right lower lobe infiltrate consistent with pneumonia. Lungs elsewhere clear. Areas of sclerotic bony metastatic disease. Electronically Signed   By: Lowella Grip III M.D.   On: 07/12/2015 14:20   Ct Angio Chest Pe W/cm &/or Wo Cm  07/12/2015  CLINICAL DATA:  Cough, congestion. Tachycardia. Current history of metastatic prostate cancer. EXAM: CT ANGIOGRAPHY CHEST WITH CONTRAST TECHNIQUE: Multidetector CT imaging of the chest was performed using the standard protocol during bolus administration of intravenous contrast. Multiplanar CT image reconstructions and MIPs were obtained to evaluate the vascular anatomy. CONTRAST:  156mL OMNIPAQUE IOHEXOL 350 MG/ML SOLN COMPARISON:  CT scan of June 13, 2015. FINDINGS: Multiple sclerotic lesions are noted in the thoracic spine consistent with metastatic disease. No pneumothorax or pleural effusion is noted. Left lung is clear. Ill-defined multifocal airspace opacities are noted in the right middle and lower lobes most consistent with multifocal pneumonia; these were not present on prior exam. There  is no evidence of thoracic aortic dissection or aneurysm. Material is noted in the lower lobe bronchi bilaterally, more prominent on the right, consistent with mucous plugging or aspiration. This also is new since prior exam. Visualized portion of upper abdomen is unremarkable. There is no definite evidence of pulmonary embolus. Right internal jugular Port-A-Cath is noted with distal tip at the cavoatrial junction. No mediastinal mass or adenopathy is noted. Review of the MIP images confirms the above findings. IMPRESSION: There is no evidence of pulmonary embolus. Multiple sclerotic lesions are noted in the thoracic spine consistent with osseous metastases. Multiple ill-defined airspace opacities are noted in the right middle and lower lobes consistent with multifocal  pneumonia. There is also noted fluid or debris in the lower lobe bronchi bilaterally, but most prominently on the right. This is concerning for mucous plugging or aspiration. Electronically Signed   By: Marijo Conception, M.D.   On: 07/12/2015 18:19   Ct Femur Left Wo Contrast  07/17/2015  CLINICAL DATA:  Thigh pain.  Unable to bear weight. EXAM: CT OF THE LEFT FEMUR WITHOUT CONTRAST; CT OF THE RIGHT FEMUR WITHOUT CONTRAST TECHNIQUE: Multidetector CT imaging was performed according to the standard protocol. Multiplanar CT image reconstructions were also generated. COMPARISON:  05/01/2015 -nuclear bone scan FINDINGS: There is mixed lytic and sclerotic osseous lesion involving the right femoral head and neck with mild surrounding periostitis along the superior femoral neck. There is a mixed lytic and sclerotic osseous lesion involving the left ischial tuberosity and the right superior pubic ramus - acetabular junction. There is no acute fracture or dislocation. 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 There is no focal fluid collection or hematoma. There is mild soft tissue edema in the subcutaneous fat of bilateral thighs. There is no muscle atrophy. IMPRESSION: 1. Mixed lytic and sclerotic osseous lesions involving the right femoral head and neck, left ischial tuberosity and right superior pubic ramus - acetabular junction most consistent with metastatic disease. There is no acute fracture or dislocation. If there is clinical concern regarding an occult fracture, evaluation with MRI is recommended for increased sensitivity. Electronically Signed   By: Kathreen Devoid   On: 07/17/2015 17:46   Ct Femur Right Wo Contrast  07/17/2015  CLINICAL DATA:  Thigh pain.  Unable to bear weight. EXAM: CT OF THE LEFT FEMUR WITHOUT CONTRAST; CT OF THE RIGHT FEMUR WITHOUT CONTRAST TECHNIQUE: Multidetector CT imaging was performed according to the standard protocol. Multiplanar CT image  reconstructions were also generated. COMPARISON:  05/01/2015 -nuclear bone scan FINDINGS: There is mixed lytic and sclerotic osseous lesion involving the right femoral head and neck with mild surrounding periostitis along the superior femoral neck. There is a mixed lytic and sclerotic osseous lesion involving the left ischial tuberosity and the right superior pubic ramus - acetabular junction. There is no acute fracture or dislocation. 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 There is no focal fluid collection or hematoma. There is mild soft tissue edema in the subcutaneous fat of bilateral thighs. There is no muscle atrophy. IMPRESSION: 1. Mixed lytic and sclerotic osseous lesions involving the right femoral head and neck, left ischial tuberosity and right superior pubic ramus - acetabular junction most consistent with metastatic disease. There is no acute fracture or dislocation. If there is clinical concern regarding an occult fracture, evaluation with MRI is recommended for increased sensitivity. Electronically Signed   By: Kathreen Devoid   On: 07/17/2015 17:46  Dg Femur Min 2 Views Left  07/09/2015  CLINICAL DATA:  Prostate cancer.  BILATERAL leg pain. EXAM: LEFT FEMUR 2 VIEWS COMPARISON:  Bone scan 05/01/2015. FINDINGS: No significant sclerotic or lytic lesions are observed in the LEFT femur. There is no fracture. The adjacent LEFT ischium demonstrates sclerosis, similar to the distribution noted on previous nuclear medicine scintigraphy scan. Soft tissue calcification adjacent to the lesser trochanter. IMPRESSION: No visible LEFT femur metastases or acute findings. Electronically Signed   By: Staci Righter M.D.   On: 07/09/2015 19:10   Dg Femur, Min 2 Views Right  07/09/2015  CLINICAL DATA:  BILATERAL leg pain for 4-5 months. History of prostate cancer. EXAM: RIGHT FEMUR 2 VIEWS COMPARISON:  Bone scan 05/01/2015.  LEFT femur reported separately. FINDINGS:  Sclerotic metastases are noted throughout the proximal RIGHT femur, including the greater and lesser trochanter, femoral neck, and femoral head. The adjacent pelvis is also affected in the acetabular region, iliac waiting, and superior pubic ramus. No pathologic fracture is evident. Vascular calcification. IMPRESSION: Sclerotic bone metastases are observed, similar in distribution to the prior nuclear medicine scintigraphy scan. No pathologic fracture. Electronically Signed   By: Staci Righter M.D.   On: 07/09/2015 19:07   Labs: Basic Metabolic Panel:  Recent Labs Lab 08/05/15 1428 08/06/15 0415  NA 138 139  K 3.6 4.1  CL 102 102  CO2 27 30  GLUCOSE 311* 208*  BUN 11 9  CREATININE 0.46* 0.45*  CALCIUM 8.9 8.6*   Liver Function Tests:  Recent Labs Lab 08/05/15 1428 08/05/15 1730  AST 44* 54*  ALT 25 25  ALKPHOS 307* 309*  BILITOT 0.7 0.7  PROT 5.7* 5.5*  ALBUMIN 2.8* 2.7*   CBC:  Recent Labs Lab 08/05/15 1428 08/06/15 0415  WBC 10.0 11.6*  NEUTROABS 8.0*  --   HGB 6.5* 8.7*  HCT 20.3* 26.2*  MCV 87.5 87.0  PLT 142* 120*   CBG:  Recent Labs Lab 08/05/15 1551 08/05/15 1650 08/05/15 2135 08/06/15 0740  GLUCAP 208* 260* 75 153*     Signed:  Barton Dubois  [facility] 08/06/2015, 11:47 AM

## 2015-08-06 NOTE — Care Management Note (Signed)
Case Management Note  Patient Details  Name: ARISTEDE BUCHMANN MRN: [id number] Date of Birth: [birth year]  Subjective/Objective:   Symptomatic anemia                 Action/Plan: NCM spoke pt and offered choice for [facility]. Pt agreeable to [facility] with [facility]. Pt states he has RW at home. Will stay with his dtr tonight. Contacted [facility] for [facility]. Pt states he has good family support but most of his family works during the day.   Expected Discharge Date:  08/06/2015               Expected Discharge Plan:  [place]  In-House Referral:  Clinical Social Work  Discharge planning Services  CM Consult  Post Acute Care Choice:  Home Health Choice offered to:  Patient  DME Arranged:  N/A DME Agency:  NA  HH Arranged:  RN, PT [place] Agency:  [facility]  Status of Service:  Completed, signed off  Medicare Important Message Given:    Date Medicare IM Given:    Medicare IM give by:    Date Additional Medicare IM Given:    Additional Medicare Important Message give by:     If discussed at [place] of Stay Meetings, dates discussed:    Additional Comments:  Erenest Rasher, RN 08/06/2015, 2:33 PM

## 2015-08-06 NOTE — Progress Notes (Signed)
08/06/15  1630  Reviewed discharge instructions with patient. Patient verbalized understanding of discharge instructions. Copy of discharge instructions given to patient.

## 2015-08-07 LAB — CBC
HCT: 26.2 % — ABNORMAL LOW (ref 39.0–52.0)
Hemoglobin: 8.7 g/dL — ABNORMAL LOW (ref 13.0–17.0)
MCH: 28.9 pg (ref 26.0–34.0)
MCHC: 33.2 g/dL (ref 30.0–36.0)
MCV: 87 fL (ref 78.0–100.0)
PLATELETS: 120 10*3/uL — AB (ref 150–400)
RBC: 3.01 MIL/uL — AB (ref 4.22–5.81)
RDW: 16.8 % — AB (ref 11.5–15.5)
WBC: 11.6 10*3/uL — AB (ref 4.0–10.5)

## 2015-08-07 LAB — HEMOGLOBIN A1C
Hgb A1c MFr Bld: 9.7 % — ABNORMAL HIGH (ref 4.8–5.6)
Mean Plasma Glucose: 232 mg/dL

## 2015-08-09 ENCOUNTER — Ambulatory Visit (HOSPITAL_BASED_OUTPATIENT_CLINIC_OR_DEPARTMENT_OTHER): Payer: PPO

## 2015-08-09 ENCOUNTER — Other Ambulatory Visit (HOSPITAL_BASED_OUTPATIENT_CLINIC_OR_DEPARTMENT_OTHER): Payer: PPO

## 2015-08-09 ENCOUNTER — Encounter (HOSPITAL_COMMUNITY): Payer: PPO

## 2015-08-09 ENCOUNTER — Ambulatory Visit (HOSPITAL_BASED_OUTPATIENT_CLINIC_OR_DEPARTMENT_OTHER): Payer: PPO | Admitting: Oncology

## 2015-08-09 ENCOUNTER — Telehealth: Payer: Self-pay | Admitting: Oncology

## 2015-08-09 ENCOUNTER — Ambulatory Visit (HOSPITAL_COMMUNITY): Payer: PPO

## 2015-08-09 ENCOUNTER — Ambulatory Visit: Payer: PPO

## 2015-08-09 VITALS — BP 140/61 | HR 117 | Temp 97.9°F | Resp 14 | Ht 66.0 in | Wt 113.9 lb

## 2015-08-09 DIAGNOSIS — D649 Anemia, unspecified: Secondary | ICD-10-CM | POA: Diagnosis not present

## 2015-08-09 DIAGNOSIS — Z5111 Encounter for antineoplastic chemotherapy: Secondary | ICD-10-CM

## 2015-08-09 DIAGNOSIS — C7951 Secondary malignant neoplasm of bone: Secondary | ICD-10-CM

## 2015-08-09 DIAGNOSIS — C61 Malignant neoplasm of prostate: Secondary | ICD-10-CM

## 2015-08-09 DIAGNOSIS — Z95828 Presence of other vascular implants and grafts: Secondary | ICD-10-CM

## 2015-08-09 LAB — COMPREHENSIVE METABOLIC PANEL
ALBUMIN: 2.5 g/dL — AB (ref 3.5–5.0)
ALK PHOS: 429 U/L — AB (ref 40–150)
ALT: 13 U/L (ref 0–55)
ANION GAP: 10 meq/L (ref 3–11)
AST: 16 U/L (ref 5–34)
BILIRUBIN TOTAL: 1.05 mg/dL (ref 0.20–1.20)
BUN: 9.4 mg/dL (ref 7.0–26.0)
CO2: 25 meq/L (ref 22–29)
Calcium: 9.1 mg/dL (ref 8.4–10.4)
Chloride: 103 mEq/L (ref 98–109)
Creatinine: 0.6 mg/dL — ABNORMAL LOW (ref 0.7–1.3)
GLUCOSE: 283 mg/dL — AB (ref 70–140)
POTASSIUM: 4 meq/L (ref 3.5–5.1)
SODIUM: 137 meq/L (ref 136–145)
TOTAL PROTEIN: 5.9 g/dL — AB (ref 6.4–8.3)

## 2015-08-09 LAB — CBC WITH DIFFERENTIAL/PLATELET
BASO%: 0.3 % (ref 0.0–2.0)
BASOS ABS: 0 10*3/uL (ref 0.0–0.1)
EOS ABS: 0 10*3/uL (ref 0.0–0.5)
EOS%: 0.2 % (ref 0.0–7.0)
HCT: 25.9 % — ABNORMAL LOW (ref 38.4–49.9)
HGB: 8.4 g/dL — ABNORMAL LOW (ref 13.0–17.1)
LYMPH%: 10.3 % — AB (ref 14.0–49.0)
MCH: 28.1 pg (ref 27.2–33.4)
MCHC: 32.5 g/dL (ref 32.0–36.0)
MCV: 86.4 fL (ref 79.3–98.0)
MONO#: 0.6 10*3/uL (ref 0.1–0.9)
MONO%: 6.9 % (ref 0.0–14.0)
NEUT%: 82.3 % — ABNORMAL HIGH (ref 39.0–75.0)
NEUTROS ABS: 6.8 10*3/uL — AB (ref 1.5–6.5)
PLATELETS: 117 10*3/uL — AB (ref 140–400)
RBC: 3 10*6/uL — AB (ref 4.20–5.82)
RDW: 18.2 % — ABNORMAL HIGH (ref 11.0–14.6)
WBC: 8.3 10*3/uL (ref 4.0–10.3)
lymph#: 0.9 10*3/uL (ref 0.9–3.3)

## 2015-08-09 MED ORDER — SODIUM CHLORIDE 0.9 % IV SOLN
Freq: Once | INTRAVENOUS | Status: AC
  Administered 2015-08-09: 10:00:00 via INTRAVENOUS
  Filled 2015-08-09: qty 4

## 2015-08-09 MED ORDER — SODIUM CHLORIDE 0.9 % IJ SOLN
10.0000 mL | INTRAMUSCULAR | Status: DC | PRN
  Administered 2015-08-09: 10 mL via INTRAVENOUS
  Filled 2015-08-09: qty 10

## 2015-08-09 MED ORDER — SODIUM CHLORIDE 0.9 % IV SOLN
Freq: Once | INTRAVENOUS | Status: AC
  Administered 2015-08-09: 10:00:00 via INTRAVENOUS

## 2015-08-09 MED ORDER — HEPARIN SOD (PORK) LOCK FLUSH 100 UNIT/ML IV SOLN
500.0000 [IU] | Freq: Once | INTRAVENOUS | Status: AC | PRN
  Administered 2015-08-09: 500 [IU]
  Filled 2015-08-09: qty 5

## 2015-08-09 MED ORDER — DOCETAXEL CHEMO INJECTION 160 MG/16ML
60.0000 mg/m2 | Freq: Once | INTRAVENOUS | Status: AC
  Administered 2015-08-09: 90 mg via INTRAVENOUS
  Filled 2015-08-09: qty 9

## 2015-08-09 MED ORDER — SODIUM CHLORIDE 0.9 % IJ SOLN
10.0000 mL | INTRAMUSCULAR | Status: DC | PRN
  Administered 2015-08-09: 10 mL
  Filled 2015-08-09: qty 10

## 2015-08-09 NOTE — Progress Notes (Signed)
Hematology and Oncology Follow Up Visit  Ricky Terry [id number] 02-08-1946 69 y.o. 08/09/2015 10:13 AM [name], MDOsei-Bonsu, [name], MD   Principle Diagnosis: 69 year old gentleman with castration resistant metastatic prostate cancer with disease to the bone. His initial diagnosis was in April 2015 with a PSA of 368 and a Gleason score of 4+5 = 9. He has metastatic disease to the bone at the time of diagnosis..   Prior Therapy: He was treated with hormone therapy and his PSA initially dropped to 34 in September 2015. His PSA was up to 72 and February 2016 and Casodex was added. His most recent PSA was up to 77.9 on 02/28/2015 and 206 in October 2016. His repeat bone scan showed marked progression of his bony metastasis compared to his initial imaging studies.  Current therapy: Taxotere chemotherapy at 75 mg per metered square every 3 weeks received cycle 1 on 06/07/2015. He is here to receive cycle 3.  Interim History: Ricky Terry presents today for a follow-up visit. Since the last visit, he was hospitalized in November 2016 for pneumonia, increased pain and failure to thrive. He subsequently missed the third cycle of chemotherapy of Taxotere. After his discharge, he was placed in a skilled nursing facility and subsequently was discharged home. Since his discharge she has been recovering rather slowly but certainly has improved. He is able to ambulate with the help of a walker and have not had any falls or syncope. His appetite has improved and he is gaining weight slowly.   His pain has been under reasonable control with the help of OxyContin twice a day and breakthrough oxycodone which she also uses 2-3 times a day. He does report still pain in his bilateral thighs that is described as soreness and that either persistent in nature.  He does not report any headaches, blurry vision, syncope or seizures. He does not report any fevers, chills or sweats. He does not report any chest  pain, palpitation orthopnea. Does not report any leg edema, cough, hemoptysis or wheezing. Does not report any nausea, vomiting, abdominal pain, hematochezia or melena. He does not report any frequency, urgency or hesitancy. Does not report any lymphadenopathy or petechiae. Remaining review of systems unremarkable  Medications: I have reviewed the patient's current medications.  No major changes by my review. Current Outpatient Prescriptions  Medication Sig Dispense Refill  . aspirin 81 MG chewable tablet Chew 1 tablet (81 mg total) by mouth daily. 30 tablet 0  . atorvastatin (LIPITOR) 40 MG tablet Take 1 tablet (40 mg total) by mouth at bedtime. 30 tablet 0  . bisacodyl (DULCOLAX) 10 MG suppository Place 1 suppository (10 mg total) rectally daily. 12 suppository 0  . cholecalciferol (VITAMIN D) 400 UNITS TABS tablet Take 400 Units by mouth daily.    . clopidogrel (PLAVIX) 75 MG tablet Take 1 tablet (75 mg total) by mouth daily with breakfast. 30 tablet 1  . dexamethasone (DECADRON) 4 MG tablet Take 1 tablet (4 mg total) by mouth 3 (three) times daily. 21 tablet 1  . feeding supplement, ENSURE ENLIVE, (ENSURE ENLIVE) LIQD Take 237 mLs by mouth 2 (two) times daily between meals. 237 mL 12  . glipiZIDE (GLUCOTROL) 10 MG tablet Take 1 tablet (10 mg total) by mouth daily before breakfast. (Patient taking differently: Take 10 mg by mouth 2 (two) times daily. ) 30 tablet 0  . insulin aspart (NOVOLOG) 100 UNIT/ML injection Inject 0-20 Units into the skin 3 (three) times daily with meals. 10 mL 11  .  Insulin Detemir (LEVEMIR) 100 UNIT/ML Pen Inject 10 Units into the skin daily. 15 mL 5  . iron polysaccharides (NIFEREX) 150 MG capsule Take 1 capsule (150 mg total) by mouth daily. 30 capsule 2  . lidocaine-prilocaine (EMLA) cream Apply 1 application topically as needed. 30 g 0  . losartan (COZAAR) 50 MG tablet Take 1 tablet (50 mg total) by mouth daily. 30 tablet 0  . metFORMIN (GLUCOPHAGE) 1000 MG tablet  Take 1 tablet (1,000 mg total) by mouth 2 (two) times daily. 30 tablet 0  . oxyCODONE (OXY IR/ROXICODONE) 5 MG immediate release tablet Take 1 tablet (5 mg total) by mouth every 4 (four) hours as needed for moderate pain. 30 tablet 0  . oxyCODONE (OXYCONTIN) 15 mg 12 hr tablet Take 1 tablet (15 mg total) by mouth every 12 (twelve) hours. 60 tablet 0  . prochlorperazine (COMPAZINE) 10 MG tablet Take 1 tablet (10 mg total) by mouth every 6 (six) hours as needed for nausea or vomiting. 30 tablet 1  . senna-docusate (SENOKOT-S) 8.6-50 MG tablet Take 1 tablet by mouth 2 (two) times daily. 60 tablet 0  . traZODone (DESYREL) 50 MG tablet Take 0.5 tablets (25 mg total) by mouth at bedtime as needed for sleep. 30 tablet 0   No current facility-administered medications for this visit.   Facility-Administered Medications Ordered in Other Visits  Medication Dose Route Frequency Provider Last Rate Last Dose  . 0.9 %  sodium chloride infusion   Intravenous Once Ricky Portela, MD      . DOCEtaxel (TAXOTERE) 90 mg in dextrose 5 % 250 mL chemo infusion  60 mg/m2 (Treatment Plan Actual) Intravenous Once Ricky Portela, MD      . heparin lock flush 100 unit/mL  500 Units Intracatheter Once PRN Ricky Portela, MD      . ondansetron (ZOFRAN) 8 mg, dexamethasone (DECADRON) 10 mg in sodium chloride 0.9 % 50 mL IVPB   Intravenous Once Ricky Portela, MD      . sodium chloride 0.9 % injection 10 mL  10 mL Intravenous PRN Ricky Portela, MD   10 mL at 08/09/15 0918  . sodium chloride 0.9 % injection 10 mL  10 mL Intracatheter PRN Ricky Portela, MD         Allergies: No Known Allergies   Physical Exam: Blood pressure 140/61, pulse 117, temperature 97.9 F (36.6 C), temperature source Oral, resp. rate 14, height 5\' 6"  (1.676 m), weight 113 lb 14.4 oz (51.665 kg), SpO2 100 %. ECOG: 1 General appearance: alert and cooperative chronically ill-appearing but without distress today. Head: Normocephalic, without obvious  abnormality no oral thrush. Neck: no adenopathy Lymph nodes: Cervical, supraclavicular, and axillary nodes normal. Heart:regular rate and rhythm, S1, S2 normal, no murmur, click, rub or gallop Lung:chest clear, no wheezing, rales, normal symmetric air entry Abdomin: soft, non-tender, without masses or organomegaly shifting dullness is ascites. EXT:no erythema, induration, or nodules   Lab Results: Lab Results  Component Value Date   WBC 8.3 08/09/2015   HGB 8.4* 08/09/2015   HCT 25.9* 08/09/2015   MCV 86.4 08/09/2015   PLT 117* 08/09/2015     Chemistry      Component Value Date/Time   NA 137 08/09/2015 0854   NA 139 08/06/2015 0415   K 4.0 08/09/2015 0854   K 4.1 08/06/2015 0415   CL 102 08/06/2015 0415   CO2 25 08/09/2015 0854   CO2 30 08/06/2015 0415   BUN 9.4  08/09/2015 0854   BUN 9 08/06/2015 0415   CREATININE 0.6* 08/09/2015 0854   CREATININE 0.45* 08/06/2015 0415      Component Value Date/Time   CALCIUM 9.1 08/09/2015 0854   CALCIUM 8.6* 08/06/2015 0415   ALKPHOS 429* 08/09/2015 0854   ALKPHOS 309* 08/05/2015 1730   AST 16 08/09/2015 0854   AST 54* 08/05/2015 1730   ALT 13 08/09/2015 0854   ALT 25 08/05/2015 1730   BILITOT 1.05 08/09/2015 0854   BILITOT 0.7 08/05/2015 1730      Results for TAY, HUWE (MRN [id number]) as of 08/09/2015 09:34  Ref. Range 06/07/2015 09:11 06/28/2015 08:35  PSA Latest Ref Range: <=4.00 ng/mL 206.70 (H) 185.30 (H)     Impression and Plan:  69 year old gentleman with the following issues:  1. Castration resistant metastatic prostate cancer with disease to the bone. His initial diagnosis was in April 2015 with a PSA of 368 and a Gleason score of 4+5 = 9. He was initially treated with Lupron initially and Casodex added in March 2016 after his PSA started to rise. His initial response in his PSA went down to 34 and now is up to 206. His staging workup including a bone scan obtained on 02/28/2015 showed progression of  disease.  He is currently on Taxotere chemotherapy and received 2 cycles of therapy and PSA dropped from 206 to 185. He missed the third cycle in November 2016 because of hospitalization. The plan is to proceed with cycle 3 today with the dose reduction given his frail status and anemia. He understands chemotherapy is palliative in nature and will not be curative. Given the advanced nature of his cancer without chemotherapy he will continue to decline rather rapidly. He is agreeable to proceed as mentioned today.  2. IV access: Port-A-Cath inserted without any complications. EMLA cream is available to the patient without any complication to the Port-A-Cath.  3. Nausea prophylaxis: Compazine is available to the patient without any recent nausea problems.  4. Neutropenia prophylaxis: He will receive Neulasta after each injection. Arthralgias, myalgias and bone pain are potential complications were reviewed with the patient. Oxycodone and Claritin can be used to alleviate some of the symptoms.  5. Bone health: He is on calcium and vitamin D. Delton See will be started once dental clearance has been obtained.  6. Weight loss: This is exacerbated by his recent hospitalization but certainly improving. We'll continue to monitor that.  7. Bilateral leg Pain: His currently on OxyContin and oxycodone. He is also under evaluation for possible palliative radiation therapy with narcotic analgesia is ineffective. I believe he would benefit from palliative radiation therapy to the femur. He will follow-up with Dr. Sondra Come.   8. Prognosis: Treatment goal is palliative and he understands that he has a rather aggressive disease with potentially limited life expectancy. He continues to reiterate that he would like to be aggressive and continue chemotherapy.  9. Anemia: His hemoglobin is adequate today but will likely require transfusion in the future if his hemoglobin drops below 8.  10. Hormone therapy: I have  recommended indefinite androgen deprivation and we'll monitor his testosterone periodically and administer Lupron at that time.  11. Follow-up: Will be in 3 weeks for the next cycle of chemotherapy.  [id number], MD 12/21/201610:13 AM

## 2015-08-09 NOTE — Telephone Encounter (Signed)
Gave pt appts for 1/11 + 2/1.

## 2015-08-09 NOTE — Patient Instructions (Signed)

## 2015-08-09 NOTE — Patient Instructions (Signed)
[facility] Discharge Instructions for Patients Receiving Chemotherapy  Today you received the following chemotherapy agents: Taxotere.  To help prevent nausea and vomiting after your treatment, we encourage you to take your nausea medication: Compazine. Take one every 6 hours as needed.  If you develop nausea and vomiting that is not controlled by your nausea medication, call the clinic.   BELOW ARE SYMPTOMS THAT SHOULD BE REPORTED IMMEDIATELY:  *FEVER GREATER THAN 100.5 F  *CHILLS WITH OR WITHOUT FEVER  NAUSEA AND VOMITING THAT IS NOT CONTROLLED WITH YOUR NAUSEA MEDICATION  *UNUSUAL SHORTNESS OF BREATH  *UNUSUAL BRUISING OR BLEEDING  TENDERNESS IN MOUTH AND THROAT WITH OR WITHOUT PRESENCE OF ULCERS  *URINARY PROBLEMS  *BOWEL PROBLEMS  UNUSUAL RASH Items with * indicate a potential emergency and should be followed up as soon as possible.  Feel free to call the clinic should you have any questions or concerns. The clinic phone number is [phone].  Please show the CHEMO ALERT CARD at check-in to the Emergency Department and triage nurse.   

## 2015-08-09 NOTE — Progress Notes (Signed)
Noted pt was scheduled for bone scan this AM. He thought he was to have it after visit. Daughter made aware when here to pick up pt, he will need to reschedule bone scan. # for radiology scheduling given. She voiced understanding, stated she is only off on Wed, will not be able to do it until then.  Left message for Dr. Clabe Seal nurse informing them of delay in bone scan.

## 2015-08-10 ENCOUNTER — Encounter: Payer: Self-pay | Admitting: Radiation Oncology

## 2015-08-10 ENCOUNTER — Ambulatory Visit
Admission: RE | Admit: 2015-08-10 | Discharge: 2015-08-10 | Disposition: A | Discharge status: Home / Self Care | Payer: PPO | Source: Ambulatory Visit | Attending: Radiation Oncology | Admitting: Radiation Oncology

## 2015-08-10 ENCOUNTER — Ambulatory Visit: Admission: RE | Admit: 2015-08-10 | Payer: PPO | Source: Ambulatory Visit | Admitting: Radiation Oncology

## 2015-08-10 VITALS — BP 135/55 | HR 112 | Temp 97.7°F | Resp 18 | Ht 66.0 in | Wt 120.1 lb

## 2015-08-10 DIAGNOSIS — C61 Malignant neoplasm of prostate: Secondary | ICD-10-CM

## 2015-08-10 LAB — PSA: PSA: 243.8 ng/mL — AB (ref <=4.00)

## 2015-08-10 NOTE — Progress Notes (Signed)
Provided patient with information and pricing from [employer] for 4 pronged cane.  Patient verbalized understanding and agreement.

## 2015-08-10 NOTE — Progress Notes (Signed)
Ricky Terry here for follow up.  He reports pain at a 2/10 in both his upper legs.  He is taking oxycodone 5 mg at lunch time and OxyContin 15 mg at bedtime.  He had chemotherapy yesterday and a blood transfusion on 12/18.  He missed his bone scan yesterday due to chemo.  He is using a walker today.  He reports he is not taking decadron but will check with his pharmacy today to pick it up.  He reports having a good energy level today.  BP 135/55 mmHg  Pulse 112  Temp(Src) 97.7 F (36.5 C) (Oral)  Resp 18  Ht 5\' 6"  (1.676 m)  Wt 120 lb 1.6 oz (54.477 kg)  BMI 19.39 kg/m2  SpO2 100%

## 2015-08-10 NOTE — Progress Notes (Signed)
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 The patient's initial consultation with myself was on 08/02/15. The patient presented to the ED once again on 08/05/15 for weakness and fatigue. He was evaluated and found to have a hemoglobin of 6.5 and glucose of 311. He was given a blood transfusion with 2 units of PRBCs and sliding scale insulin and Lantus. He was discharged the following day. He saw Dr. Alen Blew yesterday and had his third cycle of chemotherapy.  He denies any bleeding. He still experiences bilateral upper leg pain. Lately, the patient feels more stable when he walks and complains that his walker is cumbersome. He also missed his bone scan due to getting chemotherapy.  ALLERGIES:  has No Known Allergies.  Meds: Current Outpatient Prescriptions  Medication Sig Dispense Refill  . aspirin 81 MG chewable tablet Chew 1 tablet (81 mg total) by mouth daily. 30 tablet 0  . atorvastatin (LIPITOR) 40 MG tablet Take 1 tablet (40 mg total) by mouth at bedtime. 30 tablet 0  . bisacodyl (DULCOLAX) 10 MG suppository Place 1 suppository (10 mg total) rectally daily. 12 suppository 0  . cholecalciferol (VITAMIN D) 400 UNITS TABS tablet Take 400 Units by mouth daily.    . clopidogrel (PLAVIX) 75 MG tablet Take 1 tablet (75 mg total) by mouth daily with breakfast. 30 tablet 1  . feeding supplement, ENSURE ENLIVE, (ENSURE ENLIVE) LIQD Take 237 mLs by mouth 2 (two) times daily between meals. 237 mL 12  . glipiZIDE (GLUCOTROL) 10 MG tablet Take 1 tablet (10 mg total) by mouth daily before breakfast. (Patient taking differently:  Take 10 mg by mouth 2 (two) times daily. ) 30 tablet 0  . insulin aspart (NOVOLOG) 100 UNIT/ML injection Inject 0-20 Units into the skin 3 (three) times daily with meals. 10 mL 11  . Insulin Detemir (LEVEMIR) 100 UNIT/ML Pen Inject 10 Units into the skin daily. 15 mL 5  . iron polysaccharides (NIFEREX) 150 MG capsule Take 1 capsule (150 mg total) by mouth daily. 30 capsule 2  . lidocaine-prilocaine (EMLA) cream Apply 1 application topically as needed. 30 g 0  . losartan (COZAAR) 50 MG tablet Take 1 tablet (50 mg total) by mouth daily. 30 tablet 0  . metFORMIN (GLUCOPHAGE) 1000 MG tablet Take 1 tablet (1,000 mg total) by mouth 2 (two) times daily. 30 tablet 0  . oxyCODONE (OXY IR/ROXICODONE) 5 MG immediate release tablet Take 1 tablet (5 mg total) by mouth every 4 (four) hours as needed for moderate pain. 30 tablet 0  . oxyCODONE (OXYCONTIN) 15 mg 12 hr tablet Take 1 tablet (15 mg total) by mouth every 12 (twelve) hours. 60 tablet 0  . prochlorperazine (COMPAZINE) 10 MG tablet Take 1 tablet (10 mg total) by mouth every 6 (six) hours as needed for nausea or vomiting. 30 tablet 1  . senna-docusate (SENOKOT-S) 8.6-50 MG tablet Take 1 tablet by mouth 2 (two) times daily. 60 tablet 0  . traZODone (DESYREL) 50 MG tablet Take 0.5 tablets (25 mg total) by mouth at bedtime as needed for sleep. 30 tablet 0  . dexamethasone (DECADRON) 4 MG tablet Take 1  tablet (4 mg total) by mouth 3 (three) times daily. (Patient not taking: Reported on 08/10/2015) 21 tablet 1   No current facility-administered medications for this encounter.    Physical Findings: The patient is in no acute distress. Patient is alert and oriented.  height is 5\' 6"  (1.676 m) and weight is 120 lb 1.6 oz (54.477 kg). His oral temperature is 97.7 F (36.5 C). His blood pressure is 135/55 and his pulse is 112. His respiration is 18 and oxygen saturation is 100%.   The patient presents to the clinic with a walker. Lungs are clear to auscultation  bilaterally. Heart has regular rate and rhythm. Bilateral pitting ankle edema.  Lab Findings: Lab Results  Component Value Date   WBC 8.3 08/09/2015   HGB 8.4* 08/09/2015   HCT 25.9* 08/09/2015   MCV 86.4 08/09/2015   PLT 117* 08/09/2015    Radiographic Findings: Dg Chest 2 View  08/05/2015  CLINICAL DATA:  69 year old male with weakness and lethargy today. Patient on chemotherapy. EXAM: CHEST  2 VIEW COMPARISON:  07/12/2015 and prior exams FINDINGS: The cardiomediastinal silhouette is unremarkable. A right IJ Port-A-Cath is present with tip overlying the mid SVC. There is no evidence of focal airspace disease, pulmonary edema, suspicious pulmonary nodule/mass, pleural effusion, or pneumothorax. No acute bony abnormalities are identified. IMPRESSION: No active cardiopulmonary disease. Electronically Signed   By: Margarette Canada M.D.   On: 08/05/2015 15:09   Dg Chest 2 View  07/12/2015  CLINICAL DATA:  Two day history of productive cough. Hypertension. History prostate carcinoma. EXAM: CHEST  2 VIEW COMPARISON:  Chest radiograph June 12, 2015; chest CT June 13, 2015 FINDINGS: There is patchy infiltrate in the right lower lobe. The lungs elsewhere are clear. Heart size and pulmonary vascularity are normal. No adenopathy. Port-A-Cath tip is in the superior vena cava. No pneumothorax. Irregular sclerosis in the left humeral head region is stable. Sclerosis in the T7 vertebral body is likewise stable. IMPRESSION: Right lower lobe infiltrate consistent with pneumonia. Lungs elsewhere clear. Areas of sclerotic bony metastatic disease. Electronically Signed   By: Lowella Grip III M.D.   On: 07/12/2015 14:20   Ct Angio Chest Pe W/cm &/or Wo Cm  07/12/2015  CLINICAL DATA:  Cough, congestion. Tachycardia. Current history of metastatic prostate cancer. EXAM: CT ANGIOGRAPHY CHEST WITH CONTRAST TECHNIQUE: Multidetector CT imaging of the chest was performed using the standard protocol during bolus  administration of intravenous contrast. Multiplanar CT image reconstructions and MIPs were obtained to evaluate the vascular anatomy. CONTRAST:  183mL OMNIPAQUE IOHEXOL 350 MG/ML SOLN COMPARISON:  CT scan of June 13, 2015. FINDINGS: Multiple sclerotic lesions are noted in the thoracic spine consistent with metastatic disease. No pneumothorax or pleural effusion is noted. Left lung is clear. Ill-defined multifocal airspace opacities are noted in the right middle and lower lobes most consistent with multifocal pneumonia; these were not present on prior exam. There is no evidence of thoracic aortic dissection or aneurysm. Material is noted in the lower lobe bronchi bilaterally, more prominent on the right, consistent with mucous plugging or aspiration. This also is new since prior exam. Visualized portion of upper abdomen is unremarkable. There is no definite evidence of pulmonary embolus. Right internal jugular Port-A-Cath is noted with distal tip at the cavoatrial junction. No mediastinal mass or adenopathy is noted. Review of the MIP images confirms the above findings. IMPRESSION: There is no evidence of pulmonary embolus. Multiple sclerotic lesions are noted in the thoracic spine consistent with  osseous metastases. Multiple ill-defined airspace opacities are noted in the right middle and lower lobes consistent with multifocal pneumonia. There is also noted fluid or debris in the lower lobe bronchi bilaterally, but most prominently on the right. This is concerning for mucous plugging or aspiration. Electronically Signed   By: Marijo Conception, M.D.   On: 07/12/2015 18:19   Ct Femur Left Wo Contrast  07/17/2015  CLINICAL DATA:  Thigh pain.  Unable to bear weight. EXAM: CT OF THE LEFT FEMUR WITHOUT CONTRAST; CT OF THE RIGHT FEMUR WITHOUT CONTRAST TECHNIQUE: Multidetector CT imaging was performed according to the standard protocol. Multiplanar CT image reconstructions were also generated. COMPARISON:  05/01/2015  -nuclear bone scan FINDINGS: There is mixed lytic and sclerotic osseous lesion involving the right femoral head and neck with mild surrounding periostitis along the superior femoral neck. There is a mixed lytic and sclerotic osseous lesion involving the left ischial tuberosity and the right superior pubic ramus - acetabular junction. There is no acute fracture or dislocation. 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 There is no focal fluid collection or hematoma. There is mild soft tissue edema in the subcutaneous fat of bilateral thighs. There is no muscle atrophy. IMPRESSION: 1. Mixed lytic and sclerotic osseous lesions involving the right femoral head and neck, left ischial tuberosity and right superior pubic ramus - acetabular junction most consistent with metastatic disease. There is no acute fracture or dislocation. If there is clinical concern regarding an occult fracture, evaluation with MRI is recommended for increased sensitivity. Electronically Signed   By: Kathreen Devoid   On: 07/17/2015 17:46   Ct Femur Right Wo Contrast  07/17/2015  CLINICAL DATA:  Thigh pain.  Unable to bear weight. EXAM: CT OF THE LEFT FEMUR WITHOUT CONTRAST; CT OF THE RIGHT FEMUR WITHOUT CONTRAST TECHNIQUE: Multidetector CT imaging was performed according to the standard protocol. Multiplanar CT image reconstructions were also generated. COMPARISON:  05/01/2015 -nuclear bone scan FINDINGS: There is mixed lytic and sclerotic osseous lesion involving the right femoral head and neck with mild surrounding periostitis along the superior femoral neck. There is a mixed lytic and sclerotic osseous lesion involving the left ischial tuberosity and the right superior pubic ramus - acetabular junction. There is no acute fracture or dislocation. 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 There is no focal fluid collection or hematoma. There is mild soft tissue edema  in the subcutaneous fat of bilateral thighs. There is no muscle atrophy. IMPRESSION: 1. Mixed lytic and sclerotic osseous lesions involving the right femoral head and neck, left ischial tuberosity and right superior pubic ramus - acetabular junction most consistent with metastatic disease. There is no acute fracture or dislocation. If there is clinical concern regarding an occult fracture, evaluation with MRI is recommended for increased sensitivity. Electronically Signed   By: Kathreen Devoid   On: 07/17/2015 17:46    Impression: The patient is a 69 year old male with a known history of hormone resistant prostate cancer with symptomatic pain in the bilateral in upper femur, more so on the left side. However, the bone scan and CT scan has showed more significant disease in the right upper femur. The patient would be a good candidate for palliative radiation therapy to these areas. Prior to proceeding with treatment, the patient would like to receive another bone scan for further evaluation of his worsening left leg pain. Patient will be seen after completion of his bone scan for  further evaluation. Patient may also be a candidate for Radium-223 injections. I will discuss this with Dr.Shadad since the patient is currently receiving systemic chemotherapy.  Plan: I will reschedule his bone scan and call him with the results. In light of the patient not getting his bone scan, his CT simulation will be rescheduled. I advised him that raising his legs above his hips would help with his lower extremity edema and may consider a fluid pill per Dr. Alen Blew. I also wrote a prescription for a 4 pronged cane. His pharmacy is the [facility] at [address] [place], [place] [address]. He is scheduled for a Neulasta injection tomorrow. ____________________________________  Blair Promise, PhD, MD  This document serves as a record of services personally performed by Gery Pray, MD. It was created on his behalf by Darcus Austin, a  trained medical scribe. The creation of this record is based on the scribe's personal observations and the provider's statements to them. This document has been checked and approved by the attending provider.

## 2015-08-10 NOTE — Addendum Note (Signed)
Encounter addended by: Gery Pray, MD on: 08/10/2015  6:37 PM<BR>     Documentation filed: [employer], Orders

## 2015-08-11 ENCOUNTER — Telehealth: Payer: Self-pay | Admitting: *Deleted

## 2015-08-11 ENCOUNTER — Ambulatory Visit (HOSPITAL_BASED_OUTPATIENT_CLINIC_OR_DEPARTMENT_OTHER): Payer: PPO

## 2015-08-11 VITALS — BP 139/54 | HR 115 | Temp 98.2°F

## 2015-08-11 DIAGNOSIS — C61 Malignant neoplasm of prostate: Secondary | ICD-10-CM | POA: Diagnosis not present

## 2015-08-11 DIAGNOSIS — Z5189 Encounter for other specified aftercare: Secondary | ICD-10-CM

## 2015-08-11 MED ORDER — PEGFILGRASTIM INJECTION 6 MG/0.6ML ~~LOC~~
6.0000 mg | PREFILLED_SYRINGE | Freq: Once | SUBCUTANEOUS | Status: AC
  Administered 2015-08-11: 6 mg via SUBCUTANEOUS
  Filled 2015-08-11: qty 0.6

## 2015-08-11 NOTE — Patient Instructions (Signed)
Pegfilgrastim injection What is this medicine? PEGFILGRASTIM (PEG fil gra stim) is a long-acting granulocyte colony-stimulating factor that stimulates the growth of neutrophils, a type of white blood cell important in the body's fight against infection. It is used to reduce the incidence of fever and infection in patients with certain types of cancer who are receiving chemotherapy that affects the bone marrow, and to increase survival after being exposed to high doses of radiation. This medicine may be used for other purposes; ask your health care provider or pharmacist if you have questions. What should I tell my health care provider before I take this medicine? They need to know if you have any of these conditions: -kidney disease -latex allergy -ongoing radiation therapy -sickle cell disease -skin reactions to acrylic adhesives (On-Body Injector only) -an unusual or allergic reaction to pegfilgrastim, filgrastim, other medicines, foods, dyes, or preservatives -pregnant or trying to get pregnant -breast-feeding How should I use this medicine? This medicine is for injection under the skin. If you get this medicine at home, you will be taught how to prepare and give the pre-filled syringe or how to use the On-body Injector. Refer to the patient Instructions for Use for detailed instructions. Use exactly as directed. Take your medicine at regular intervals. Do not take your medicine more often than directed. It is important that you put your used needles and syringes in a special sharps container. Do not put them in a trash can. If you do not have a sharps container, call your pharmacist or healthcare provider to get one. Talk to your pediatrician regarding the use of this medicine in children. While this drug may be prescribed for selected conditions, precautions do apply. Overdosage: If you think you have taken too much of this medicine contact a poison control center or emergency room at  once. NOTE: This medicine is only for you. Do not share this medicine with others. What if I miss a dose? It is important not to miss your dose. Call your doctor or health care professional if you miss your dose. If you miss a dose due to an On-body Injector failure or leakage, a new dose should be administered as soon as possible using a single prefilled syringe for manual use. What may interact with this medicine? Interactions have not been studied. Give your health care provider a list of all the medicines, herbs, non-prescription drugs, or dietary supplements you use. Also tell them if you smoke, drink alcohol, or use illegal drugs. Some items may interact with your medicine. This list may not describe all possible interactions. Give your health care provider a list of all the medicines, herbs, non-prescription drugs, or dietary supplements you use. Also tell them if you smoke, drink alcohol, or use illegal drugs. Some items may interact with your medicine. What should I watch for while using this medicine? You may need blood work done while you are taking this medicine. If you are going to need a MRI, CT scan, or other procedure, tell your doctor that you are using this medicine (On-Body Injector only). What side effects may I notice from receiving this medicine? Side effects that you should report to your doctor or health care professional as soon as possible: -allergic reactions like skin rash, itching or hives, swelling of the face, lips, or tongue -dizziness -fever -pain, redness, or irritation at site where injected -pinpoint red spots on the skin -red or dark-brown urine -shortness of breath or breathing problems -stomach or side pain, or pain   at the shoulder -swelling -tiredness -trouble passing urine or change in the amount of urine Side effects that usually do not require medical attention (report to your doctor or health care professional if they continue or are  bothersome): -bone pain -muscle pain This list may not describe all possible side effects. Call your doctor for medical advice about side effects. You may report side effects to FDA at 1-800-FDA-1088. Where should I keep my medicine? Keep out of the reach of children. Store pre-filled syringes in a refrigerator between 2 and 8 degrees C (36 and 46 degrees F). Do not freeze. Keep in carton to protect from light. Throw away this medicine if it is left out of the refrigerator for more than 48 hours. Throw away any unused medicine after the expiration date. NOTE: This sheet is a summary. It may not cover all possible information. If you have questions about this medicine, talk to your doctor, pharmacist, or health care provider.    2016, [employer]. (2014-08-25 14:30:14)  

## 2015-08-11 NOTE — Telephone Encounter (Signed)
CALLED PATIENT TO INFORM OF BONE SCAN ON 08-18-15- ARRIVAL TIME - 11:45 AM @ [facility] AND HE IS TO RETURN @ [facility] @ 3 PM ON 08-18-15 TO BE SCANNED, PATIENT VERIFIED UNDERSTANDING THIS

## 2015-08-18 ENCOUNTER — Ambulatory Visit (HOSPITAL_COMMUNITY)
Admission: RE | Admit: 2015-08-18 | Discharge: 2015-08-18 | Disposition: A | Discharge status: Home / Self Care | Payer: PPO | Source: Ambulatory Visit | Attending: Radiation Oncology | Admitting: Radiation Oncology

## 2015-08-18 ENCOUNTER — Encounter (HOSPITAL_COMMUNITY): Payer: PPO

## 2015-08-18 DIAGNOSIS — C7951 Secondary malignant neoplasm of bone: Secondary | ICD-10-CM | POA: Insufficient documentation

## 2015-08-18 DIAGNOSIS — C61 Malignant neoplasm of prostate: Secondary | ICD-10-CM | POA: Insufficient documentation

## 2015-08-18 MED ORDER — TECHNETIUM TC 99M MEDRONATE IV KIT
25.0000 | PACK | Freq: Once | INTRAVENOUS | Status: AC | PRN
  Administered 2015-08-18: 26.9 via INTRAVENOUS

## 2015-08-23 ENCOUNTER — Encounter: Payer: Self-pay | Admitting: Radiation Oncology

## 2015-08-23 ENCOUNTER — Ambulatory Visit
Admission: RE | Admit: 2015-08-23 | Discharge: 2015-08-23 | Disposition: A | Discharge status: Home / Self Care | Payer: PPO | Source: Ambulatory Visit | Attending: Radiation Oncology | Admitting: Radiation Oncology

## 2015-08-23 VITALS — BP 138/56 | HR 108 | Temp 98.2°F | Ht 66.0 in | Wt 132.5 lb

## 2015-08-23 DIAGNOSIS — C7951 Secondary malignant neoplasm of bone: Secondary | ICD-10-CM

## 2015-08-23 DIAGNOSIS — C61 Malignant neoplasm of prostate: Secondary | ICD-10-CM | POA: Diagnosis present

## 2015-08-23 DIAGNOSIS — Z510 Encounter for antineoplastic radiation therapy: Secondary | ICD-10-CM | POA: Diagnosis present

## 2015-08-23 MED ORDER — FUROSEMIDE 20 MG PO TABS: 20.0000 mg | ORAL_TABLET | Freq: Every day | ORAL | Status: AC

## 2015-08-23 NOTE — Progress Notes (Addendum)
Mr. Ricky Terry is here to follow up from a visit 08/10/15 to evaluate bone metastasis to his bilateral legs. He is taking oxycontin 15 mg every 12 hours and he is also taking oxycodone 5 mg only at night to help with his pain which he rates a 3/10 on the pain scale. His last chemotherapy with Dr. Alen Blew was 12/21, and he reports no difficulty with the chemotherapy. He reports he is not eating well, and also supplements his diet with 2 cans of ensure every day. He has no other concerns at this time.   BP 138/56 mmHg  Pulse 108  Temp(Src) 98.2 F (36.8 C)  Ht 5\' 6"  (1.676 m)  Wt 132 lb 8 oz (60.102 kg)  BMI 21.40 kg/m2  Wt Readings from Last 3 Encounters:  08/23/15 132 lb 8 oz (60.102 kg)  08/10/15 120 lb 1.6 oz (54.477 kg)  08/09/15 113 lb 14.4 oz (51.665 kg)

## 2015-08-23 NOTE — Progress Notes (Signed)
  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  NARRATIVE:  The patient was brought to the [facility].  Identity was confirmed.  All relevant records and images related to the planned course of therapy were reviewed.  The patient freely provided informed written consent to proceed with treatment after reviewing the details related to the planned course of therapy. The consent form was witnessed and verified by the simulation staff.  Then, the patient was set-up in a stable reproducible  supine position for radiation therapy.  CT images were obtained.  Surface markings were placed.  The CT images were loaded into the planning software.  Then the target and avoidance structures were contoured.  Treatment planning then occurred.  The radiation prescription was entered and confirmed.  Then, I designed and supervised the construction of a total of 5 medically necessary complex treatment devices.  I have requested : Isodose Plan.  I have ordered:dose calc. PLAN:  The patient will receive 30 Gy in 10 fractions directed at the right proximal femur and left medial pelvis region.  ________________________________ -----------------------------------  Blair Promise, PhD, MD    This document serves as a record of services personally performed by Gery Pray, MD. It was created on his behalf by Lendon Collar, a trained medical scribe. The creation of this record is based on the scribe's personal observations and the provider's statements to them. This document has been checked and approved by the attending provider.

## 2015-08-23 NOTE — Addendum Note (Signed)
Encounter addended by: Jacqulyn Liner, RN on: 08/23/2015 12:58 PM<BR>     Documentation filed: Charges VN

## 2015-08-23 NOTE — Progress Notes (Signed)
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  Interval Since Last Radiation: N/A  Narrative:  The patient returns today for further evaluation. The patient's initial consultation with myself was on 08/02/15. The patient presented to the ED once again on 08/05/15 for weakness and fatigue. He follows up after a bone scan since his last visit on 08/10/15 to evaluate bone metastasis to his bilateral legs.Marland Kitchen He continues to have pain in his right upper leg and left pelvis region. He is taking oxycontin 15 mg every 12 hours and he is also taking oxycodone 5 mg only at night to help with his pain which he rates a 3/10 on the pain scale. His last chemotherapy with Dr. Alen Blew was 08/09/15, and he reports no difficulty with the chemotherapy. He reports he is not eating well, and also supplements his diet with 2 cans of ensure every day. He has no other concerns at this time. He states his leg pain is worse when he lifts his leg and when he tries to get up from sitting. He mentions that he still has edema in his ankles and asked for medication for it.   ALLERGIES:  has No Known Allergies.  Meds: Current Outpatient Prescriptions  Medication Sig Dispense Refill  . aspirin 81 MG chewable tablet Chew 1 tablet (81 mg total) by mouth daily. 30 tablet 0  . atorvastatin (LIPITOR) 40 MG tablet Take 1 tablet (40 mg total) by mouth at bedtime. 30 tablet 0  . cholecalciferol (VITAMIN D) 400 UNITS TABS tablet Take 400 Units by mouth daily.    . clopidogrel (PLAVIX) 75 MG tablet Take 1 tablet (75 mg total) by mouth daily with breakfast. 30 tablet 1  . feeding supplement, ENSURE ENLIVE, (ENSURE ENLIVE) LIQD Take 237 mLs by mouth 2 (two) times daily  between meals. 237 mL 12  . glipiZIDE (GLUCOTROL) 10 MG tablet Take 1 tablet (10 mg total) by mouth daily before breakfast. (Patient taking differently: Take 10 mg by mouth 2 (two) times daily. ) 30 tablet 0  . iron polysaccharides (NIFEREX) 150 MG capsule Take 1 capsule (150 mg total) by mouth daily. 30 capsule 2  . lidocaine-prilocaine (EMLA) cream Apply 1 application topically as needed. 30 g 0  . losartan (COZAAR) 50 MG tablet Take 1 tablet (50 mg total) by mouth daily. 30 tablet 0  . metFORMIN (GLUCOPHAGE) 1000 MG tablet Take 1 tablet (1,000 mg total) by mouth 2 (two) times daily. 30 tablet 0  . oxyCODONE (OXY IR/ROXICODONE) 5 MG immediate release tablet Take 1 tablet (5 mg total) by mouth every 4 (four) hours as needed for moderate pain. 30 tablet 0  . oxyCODONE (OXYCONTIN) 15 mg 12 hr tablet Take 1 tablet (15 mg total) by mouth every 12 (twelve) hours. 60 tablet 0  . prochlorperazine (COMPAZINE) 10 MG tablet Take 1 tablet (10 mg total) by mouth every 6 (six) hours as needed for nausea or vomiting. 30 tablet 1  . senna-docusate (SENOKOT-S) 8.6-50 MG tablet Take 1 tablet by mouth 2 (two) times daily. 60 tablet 0  . traZODone (DESYREL) 50 MG tablet Take 0.5 tablets (25 mg total) by mouth at bedtime as needed for sleep. 30 tablet 0  . bisacodyl (DULCOLAX) 10 MG suppository Place 1  suppository (10 mg total) rectally daily. (Patient not taking: Reported on 08/23/2015) 12 suppository 0  . dexamethasone (DECADRON) 4 MG tablet Take 1 tablet (4 mg total) by mouth 3 (three) times daily. (Patient not taking: Reported on 08/10/2015) 21 tablet 1  . insulin aspart (NOVOLOG) 100 UNIT/ML injection Inject 0-20 Units into the skin 3 (three) times daily with meals. (Patient not taking: Reported on 08/23/2015) 10 mL 11  . Insulin Detemir (LEVEMIR) 100 UNIT/ML Pen Inject 10 Units into the skin daily. (Patient not taking: Reported on 08/23/2015) 15 mL 5   No current facility-administered medications for this encounter.     Physical Findings: The patient is in no acute distress. Patient is alert and oriented.  height is 5\' 6"  (1.676 m) and weight is 132 lb 8 oz (60.102 kg). His temperature is 98.2 F (36.8 C). His blood pressure is 138/56 and his pulse is 108.   The patient presents to the clinic with a walker and is using a wheelchair. Lungs are clear to auscultation bilaterally. Heart has regular rate and rhythm. Bilateral pitting ankle and calf edema.  Lab Findings: Lab Results  Component Value Date   WBC 8.3 08/09/2015   HGB 8.4* 08/09/2015   HCT 25.9* 08/09/2015   MCV 86.4 08/09/2015   PLT 117* 08/09/2015    Radiographic Findings: Dg Chest 2 View  08/05/2015  CLINICAL DATA:  70 year old male with weakness and lethargy today. Patient on chemotherapy. EXAM: CHEST  2 VIEW COMPARISON:  07/12/2015 and prior exams FINDINGS: The cardiomediastinal silhouette is unremarkable. A right IJ Port-A-Cath is present with tip overlying the mid SVC. There is no evidence of focal airspace disease, pulmonary edema, suspicious pulmonary nodule/mass, pleural effusion, or pneumothorax. No acute bony abnormalities are identified. IMPRESSION: No active cardiopulmonary disease. Electronically Signed   By: Margarette Canada M.D.   On: 08/05/2015 15:09   Nm Bone Scan Whole Body  08/18/2015  CLINICAL DATA:  Prostate cancer. Evaluate for bone metastasis. Bilateral upper leg pain for 6 months. PSA of 243.8 on 08/09/2015. EXAM: NUCLEAR MEDICINE WHOLE BODY BONE SCAN TECHNIQUE: Whole body anterior and posterior images were obtained approximately 3 hours after intravenous injection of radiopharmaceutical. RADIOPHARMACEUTICALS:  26.9 mCi Technetium-57m MDP IV COMPARISON:  05/01/2015 FINDINGS: Given slight differences in technique, no significant change in widespread osseous metastasis. Examples include within the proximal left humerus, sternal manubrium and body, multiple bilateral ribs, thoracolumbar vertebral bodies, diffusely throughout the  pelvis, and within the proximal right greater than left femurs. No new sites of disease. Absent visualization of the kidneys is likely due to the extent of tracer uptake within the abnormal bones. IMPRESSION: Given differences in technique, no significant change in widespread osseous metastasis. Electronically Signed   By: Abigail Miyamoto M.D.   On: 08/18/2015 15:00    Impression: The patient is a 70 year old male with a known history of hormone resistant prostate cancer with symptomatic pain in the bilateral in upper femur, more so on the left side. However, the bone scan and CT scan has showed more significant disease in the right upper femur. Scan also shows uptake in the medial lower pelvis region which is likely explaining his left-sided pain. The patient would be a good candidate for palliative radiation therapy to these areas.  Plan: I prescribed him Lasix for his edema. He has his planning appointment scheduled this morning. I explained the type of treatment and the side effects of radiation. The consent form was signed. He will start palliative  radiation therapy to his right proximal femur and left medial pelvic area next week.  ____________________________________  Blair Promise, PhD, MD  This document serves as a record of services personally performed by Gery Pray, MD. It was created on his behalf by Lendon Collar, a trained medical scribe. The creation of this record is based on the scribe's personal observations and the provider's statements to them. This document has been checked and approved by the attending provider.

## 2015-08-28 DIAGNOSIS — Z510 Encounter for antineoplastic radiation therapy: Secondary | ICD-10-CM | POA: Diagnosis not present

## 2015-08-30 ENCOUNTER — Ambulatory Visit (HOSPITAL_COMMUNITY)
Admission: RE | Admit: 2015-08-30 | Discharge: 2015-08-30 | Disposition: A | Discharge status: Home / Self Care | Payer: PPO | Source: Ambulatory Visit | Attending: Oncology | Admitting: Oncology

## 2015-08-30 ENCOUNTER — Ambulatory Visit (HOSPITAL_BASED_OUTPATIENT_CLINIC_OR_DEPARTMENT_OTHER): Payer: PPO

## 2015-08-30 ENCOUNTER — Ambulatory Visit
Admission: RE | Admit: 2015-08-30 | Discharge: 2015-08-30 | Disposition: A | Discharge status: Home / Self Care | Payer: PPO | Source: Ambulatory Visit | Attending: Radiation Oncology | Admitting: Radiation Oncology

## 2015-08-30 ENCOUNTER — Ambulatory Visit (HOSPITAL_BASED_OUTPATIENT_CLINIC_OR_DEPARTMENT_OTHER): Payer: PPO | Admitting: Oncology

## 2015-08-30 ENCOUNTER — Ambulatory Visit: Payer: PPO

## 2015-08-30 ENCOUNTER — Other Ambulatory Visit (HOSPITAL_BASED_OUTPATIENT_CLINIC_OR_DEPARTMENT_OTHER): Payer: PPO

## 2015-08-30 VITALS — BP 122/50 | HR 116 | Temp 98.1°F | Resp 18 | Ht 66.0 in | Wt 118.5 lb

## 2015-08-30 VITALS — BP 129/54 | HR 114 | Temp 97.6°F | Resp 18

## 2015-08-30 VITALS — BP 180/76 | HR 105 | Temp 98.0°F | Resp 19

## 2015-08-30 DIAGNOSIS — D649 Anemia, unspecified: Secondary | ICD-10-CM | POA: Diagnosis not present

## 2015-08-30 DIAGNOSIS — C7951 Secondary malignant neoplasm of bone: Secondary | ICD-10-CM

## 2015-08-30 DIAGNOSIS — C61 Malignant neoplasm of prostate: Secondary | ICD-10-CM

## 2015-08-30 DIAGNOSIS — Z5111 Encounter for antineoplastic chemotherapy: Secondary | ICD-10-CM

## 2015-08-30 DIAGNOSIS — Z510 Encounter for antineoplastic radiation therapy: Secondary | ICD-10-CM | POA: Diagnosis not present

## 2015-08-30 LAB — COMPREHENSIVE METABOLIC PANEL
ALK PHOS: 619 U/L — AB (ref 40–150)
ALT: <9 U/L (ref 0–55)
ANION GAP: 10 meq/L (ref 3–11)
AST: 27 U/L (ref 5–34)
Albumin: 2.8 g/dL — ABNORMAL LOW (ref 3.5–5.0)
BUN: 8.8 mg/dL (ref 7.0–26.0)
CO2: 25 meq/L (ref 22–29)
Calcium: 9 mg/dL (ref 8.4–10.4)
Chloride: 101 mEq/L (ref 98–109)
Creatinine: 0.8 mg/dL (ref 0.7–1.3)
GLUCOSE: 459 mg/dL — AB (ref 70–140)
POTASSIUM: 4.3 meq/L (ref 3.5–5.1)
SODIUM: 135 meq/L — AB (ref 136–145)
Total Bilirubin: 0.51 mg/dL (ref 0.20–1.20)
Total Protein: 6.3 g/dL — ABNORMAL LOW (ref 6.4–8.3)

## 2015-08-30 LAB — CBC WITH DIFFERENTIAL/PLATELET
BASO%: 0.3 % (ref 0.0–2.0)
Basophils Absolute: 0 10*3/uL (ref 0.0–0.1)
EOS ABS: 0 10*3/uL (ref 0.0–0.5)
EOS%: 0 % (ref 0.0–7.0)
HEMATOCRIT: 20.4 % — AB (ref 38.4–49.9)
HEMOGLOBIN: 6.3 g/dL — AB (ref 13.0–17.1)
LYMPH#: 0.7 10*3/uL — AB (ref 0.9–3.3)
LYMPH%: 5.9 % — ABNORMAL LOW (ref 14.0–49.0)
MCH: 27.3 pg (ref 27.2–33.4)
MCHC: 30.9 g/dL — ABNORMAL LOW (ref 32.0–36.0)
MCV: 88.3 fL (ref 79.3–98.0)
MONO#: 0.5 10*3/uL (ref 0.1–0.9)
MONO%: 3.9 % (ref 0.0–14.0)
NEUT%: 89.9 % — ABNORMAL HIGH (ref 39.0–75.0)
NEUTROS ABS: 10.7 10*3/uL — AB (ref 1.5–6.5)
NRBC: 0 % (ref 0–0)
PLATELETS: 271 10*3/uL (ref 140–400)
RBC: 2.31 10*6/uL — AB (ref 4.20–5.82)
RDW: 17.9 % — AB (ref 11.0–14.6)
WBC: 11.8 10*3/uL — AB (ref 4.0–10.3)

## 2015-08-30 LAB — PREPARE RBC (CROSSMATCH)

## 2015-08-30 MED ORDER — ACETAMINOPHEN 325 MG PO TABS
650.0000 mg | ORAL_TABLET | Freq: Once | ORAL | Status: AC
  Administered 2015-08-30: 650 mg via ORAL

## 2015-08-30 MED ORDER — ACETAMINOPHEN 325 MG PO TABS
ORAL_TABLET | ORAL | Status: AC
  Filled 2015-08-30: qty 2

## 2015-08-30 MED ORDER — LIDOCAINE-PRILOCAINE 2.5-2.5 % EX CREA
TOPICAL_CREAM | CUTANEOUS | Status: AC
  Filled 2015-08-30: qty 5

## 2015-08-30 MED ORDER — SODIUM CHLORIDE 0.9 % IV SOLN
Freq: Once | INTRAVENOUS | Status: AC
  Administered 2015-08-30: 13:00:00 via INTRAVENOUS

## 2015-08-30 MED ORDER — SODIUM CHLORIDE 0.9 % IV SOLN
Freq: Once | INTRAVENOUS | Status: AC
  Administered 2015-08-30: 13:00:00 via INTRAVENOUS
  Filled 2015-08-30: qty 4

## 2015-08-30 MED ORDER — DIPHENHYDRAMINE HCL 25 MG PO CAPS
25.0000 mg | ORAL_CAPSULE | Freq: Once | ORAL | Status: AC
  Administered 2015-08-30: 25 mg via ORAL

## 2015-08-30 MED ORDER — DIPHENHYDRAMINE HCL 25 MG PO CAPS
ORAL_CAPSULE | ORAL | Status: AC
  Filled 2015-08-30: qty 1

## 2015-08-30 MED ORDER — SODIUM CHLORIDE 0.9 % IJ SOLN
10.0000 mL | Freq: Once | INTRAMUSCULAR | Status: AC
  Administered 2015-08-30: 10 mL
  Filled 2015-08-30: qty 10

## 2015-08-30 MED ORDER — MEGESTROL ACETATE 400 MG/10ML PO SUSP: 400.0000 mg | Freq: Two times a day (BID) | ORAL | Status: DC

## 2015-08-30 MED ORDER — HEPARIN SOD (PORK) LOCK FLUSH 100 UNIT/ML IV SOLN
500.0000 [IU] | Freq: Once | INTRAVENOUS | Status: AC | PRN
  Administered 2015-08-30: 500 [IU]
  Filled 2015-08-30: qty 5

## 2015-08-30 MED ORDER — SODIUM CHLORIDE 0.9 % IJ SOLN
10.0000 mL | INTRAMUSCULAR | Status: DC | PRN
  Filled 2015-08-30: qty 10

## 2015-08-30 MED ORDER — SODIUM CHLORIDE 0.9 % IJ SOLN
3.0000 mL | INTRAMUSCULAR | Status: DC | PRN
  Filled 2015-08-30: qty 10

## 2015-08-30 MED ORDER — HEPARIN SOD (PORK) LOCK FLUSH 100 UNIT/ML IV SOLN
250.0000 [IU] | INTRAVENOUS | Status: DC | PRN
  Filled 2015-08-30: qty 5

## 2015-08-30 MED ORDER — SODIUM CHLORIDE 0.9 % IV SOLN
60.0000 mg/m2 | Freq: Once | INTRAVENOUS | Status: AC
  Administered 2015-08-30: 90 mg via INTRAVENOUS
  Filled 2015-08-30: qty 9

## 2015-08-30 MED ORDER — SODIUM CHLORIDE 0.9 % IJ SOLN
10.0000 mL | INTRAMUSCULAR | Status: DC | PRN
  Administered 2015-08-30: 10 mL
  Filled 2015-08-30: qty 10

## 2015-08-30 MED ORDER — TRAZODONE HCL 50 MG PO TABS: 25.0000 mg | ORAL_TABLET | Freq: Every evening | ORAL | Status: AC | PRN

## 2015-08-30 MED ORDER — OXYCODONE HCL 5 MG PO TABS: 5.0000 mg | ORAL_TABLET | ORAL | Status: DC | PRN

## 2015-08-30 NOTE — Progress Notes (Signed)
  Radiation Oncology         [phone] ________________________________  Name: Ricky Terry MRN: [id number]  Date: 08/30/2015  DOB: Dec 17, 1945  Simulation Verification Note    ICD-9-CM ICD-10-CM   1. Prostate cancer ([place]) 185 C61   2. Neoplasm of prostate, distant metastasis staging category M1b: metastasis to bone ([place]) 185 C61    198.5 C79.51     Status: outpatient  NARRATIVE: The patient was brought to the treatment unit and placed in the planned treatment position. The clinical setup was verified. Then port films were obtained and uploaded to the radiation oncology medical record software.  The treatment beams were carefully compared against the planned radiation fields. The position location and shape of the radiation fields was reviewed. They targeted volume of tissue appears to be appropriately covered by the radiation beams. Organs at risk appear to be excluded as planned.  Based on my personal review, I approved the simulation verification. The patient's treatment will proceed as planned.  -----------------------------------  Blair Promise, PhD, MD    This document serves as a record of services personally performed by Gery Pray, MD. It was created on his behalf by Lendon Collar, a trained medical scribe. The creation of this record is based on the scribe's personal observations and the provider's statements to them. This document has been checked and approved by the attending provider.

## 2015-08-30 NOTE — Patient Instructions (Addendum)
[facility] Discharge Instructions for Patients Receiving Chemotherapy  Today you received the following chemotherapy agents taxol   If you develop nausea and vomiting that is not controlled by your nausea medication, call the clinic.   BELOW ARE SYMPTOMS THAT SHOULD BE REPORTED IMMEDIATELY:  *FEVER GREATER THAN 100.5 F  *CHILLS WITH OR WITHOUT FEVER  NAUSEA AND VOMITING THAT IS NOT CONTROLLED WITH YOUR NAUSEA MEDICATION  *UNUSUAL SHORTNESS OF BREATH  *UNUSUAL BRUISING OR BLEEDING  TENDERNESS IN MOUTH AND THROAT WITH OR WITHOUT PRESENCE OF ULCERS  *URINARY PROBLEMS  *BOWEL PROBLEMS  UNUSUAL RASH Items with * indicate a potential emergency and should be followed up as soon as possible.  Feel free to call the clinic you have any questions or concerns. The clinic phone number is [phone].  Please show the [facility] at check-in to the Emergency Department and triage nurse.    Blood Transfusion  A blood transfusion is a procedure in which you receive donated blood through an IV tube. You may need a blood transfusion because of illness, surgery, or injury. The blood may come from a donor, or it may be your own blood that you donated previously. The blood given in a transfusion is made up of different types of cells. You may receive:  Red blood cells. These carry oxygen and replace lost blood.  Platelets. These control bleeding.  Plasma. Thishelps blood to clot. If you have hemophilia or another clotting disorder, you may also receive other types of blood products. LET [facility] CARE PROVIDER KNOW ABOUT:  Any allergies you have.  All medicines you are taking, including vitamins, herbs, eye drops, creams, and over-the-counter medicines.  Previous problems you or members of your family have had with the use of anesthetics.  Any blood disorders you have.  Previous surgeries you have had.  Any medical conditions you may have.  Any previous  reactions you have had during a blood transfusion.  RISKS AND COMPLICATIONS Generally, this is a safe procedure. However, problems may occur, including:  Having an allergic reaction to something in the donated blood.  Fever. This may be a reaction to the white blood cells in the transfused blood.  Iron overload. This can happen from having many transfusions.  Transfusion-related acute lung injury (TRALI). This is a rare reaction that causes lung damage. The cause is not known.TRALI can occur within hours of a transfusion or several days later.  Sudden (acute) or delayed hemolytic reactions. This happens if your blood does not match the cells in your transfusion. Your body's defense system (immune system) may try to attack the new cells. This complication is rare.  Infection. This is rare. BEFORE THE PROCEDURE  You may have a blood test to determine your blood type. This is necessary to know what kind of blood your body will accept.  If you are going to have a planned surgery, you may donate your own blood. This may be done in case you need to have a transfusion.  If you have had an allergic reaction to a transfusion in the past, you may be given medicine to help prevent a reaction. Take this medicine only as directed by your health care provider.  You will have your temperature, blood pressure, and pulse monitored before the transfusion. PROCEDURE   An IV will be started in your hand or arm.  The bag of donated blood will be attached to your IV tube and given into your vein.  Your  temperature, blood pressure, and pulse will be monitored regularly during the transfusion. This monitoring is done to detect early signs of a transfusion reaction.  If you have any signs or symptoms of a reaction, your transfusion will be stopped and you may be given medicine.  When the transfusion is over, your IV will be removed.  Pressure may be applied to the IV site for a few minutes.  A bandage  (dressing) will be applied. The procedure may vary among health care providers and hospitals. AFTER THE PROCEDURE  Your blood pressure, temperature, and pulse will be monitored regularly.   This information is not intended to replace advice given to you by your health care provider. Make sure you discuss any questions you have with your health care provider.   Document Released: 08/02/2000 Document Revised: 08/26/2014 Document Reviewed: 06/15/2014 Elsevier Interactive Patient Education [employer].

## 2015-08-30 NOTE — Progress Notes (Signed)
Hematology and Oncology Follow Up Visit  Ricky Terry [id number] 04-Jun-1946 70 y.o. 08/30/2015 10:41 AM [name], MDOsei-Bonsu, [name], MD   Principle Diagnosis: 70 year old gentleman with castration resistant metastatic prostate cancer with disease to the bone. His initial diagnosis was in April 2015 with a PSA of 368 and a Gleason score of 4+5 = 9. He has metastatic disease to the bone at the time of diagnosis..   Prior Therapy: He was treated with hormone therapy and his PSA initially dropped to 34 in September 2015. His PSA was up to 72 and February 2016 and Casodex was added. His most recent PSA was up to 77.9 on 02/28/2015 and 206 in October 2016. His repeat bone scan showed marked progression of his bony metastasis compared to his initial imaging studies.  Current therapy: Taxotere chemotherapy at 75 mg per metered square every 3 weeks received cycle 1 on 06/07/2015. He is here to receive cycle 4 the dose was reduced to 60 mg/m for better tolerance.  Interim History: Mr. Ricky Terry presents today for a follow-up visit. Since the last visit, he reports further decline in his health. He is reporting more fatigue and tiredness. He also reported decline in his appetite and further weight loss. He reports that when his appetite is reasonable the meals might not be available at times. He still ambulating with the help of walker and had not reported any falls or syncope. He has tolerated the last cycle of chemotherapy without any major nausea or vomiting. He denied any peripheral neuropathy or other complications.   His pain has been under reasonable control with the help of OxyContin twice a day and breakthrough oxycodone which she also uses 2-3 times a day. He is under evaluation to start palliative radiation therapy as well.  He does not report any headaches, blurry vision, syncope or seizures. He does not report any fevers, chills or sweats. He does not report any chest pain,  palpitation orthopnea. Does not report any leg edema, cough, hemoptysis or wheezing. Does not report any nausea, vomiting, abdominal pain, hematochezia or melena. He does not report any frequency, urgency or hesitancy. Does not report any lymphadenopathy or petechiae. Remaining review of systems unremarkable  Medications: I have reviewed the patient's current medications.  No major changes by my review. Current Outpatient Prescriptions  Medication Sig Dispense Refill  . aspirin 81 MG chewable tablet Chew 1 tablet (81 mg total) by mouth daily. 30 tablet 0  . atorvastatin (LIPITOR) 40 MG tablet Take 1 tablet (40 mg total) by mouth at bedtime. 30 tablet 0  . cholecalciferol (VITAMIN D) 400 UNITS TABS tablet Take 400 Units by mouth daily.    . clopidogrel (PLAVIX) 75 MG tablet Take 1 tablet (75 mg total) by mouth daily with breakfast. 30 tablet 1  . dexamethasone (DECADRON) 4 MG tablet Take 1 tablet (4 mg total) by mouth 3 (three) times daily. 21 tablet 1  . feeding supplement, ENSURE ENLIVE, (ENSURE ENLIVE) LIQD Take 237 mLs by mouth 2 (two) times daily between meals. 237 mL 12  . furosemide (LASIX) 20 MG tablet Take 1 tablet (20 mg total) by mouth daily. 3 tablet 0  . glipiZIDE (GLUCOTROL) 10 MG tablet Take 1 tablet (10 mg total) by mouth daily before breakfast. (Patient taking differently: Take 10 mg by mouth 2 (two) times daily. ) 30 tablet 0  . insulin aspart (NOVOLOG) 100 UNIT/ML injection Inject 0-20 Units into the skin 3 (three) times daily with meals. 10 mL 11  .  Insulin Detemir (LEVEMIR) 100 UNIT/ML Pen Inject 10 Units into the skin daily. 15 mL 5  . iron polysaccharides (NIFEREX) 150 MG capsule Take 1 capsule (150 mg total) by mouth daily. 30 capsule 2  . lidocaine-prilocaine (EMLA) cream Apply 1 application topically as needed. 30 g 0  . losartan (COZAAR) 50 MG tablet Take 1 tablet (50 mg total) by mouth daily. 30 tablet 0  . metFORMIN (GLUCOPHAGE) 1000 MG tablet Take 1 tablet (1,000 mg  total) by mouth 2 (two) times daily. 30 tablet 0  . oxyCODONE (OXY IR/ROXICODONE) 5 MG immediate release tablet Take 1 tablet (5 mg total) by mouth every 4 (four) hours as needed for moderate pain. 30 tablet 0  . oxyCODONE (OXYCONTIN) 15 mg 12 hr tablet Take 1 tablet (15 mg total) by mouth every 12 (twelve) hours. 60 tablet 0  . prochlorperazine (COMPAZINE) 10 MG tablet Take 1 tablet (10 mg total) by mouth every 6 (six) hours as needed for nausea or vomiting. 30 tablet 1  . senna-docusate (SENOKOT-S) 8.6-50 MG tablet Take 1 tablet by mouth 2 (two) times daily. 60 tablet 0  . traZODone (DESYREL) 50 MG tablet Take 0.5 tablets (25 mg total) by mouth at bedtime as needed for sleep. 30 tablet 0  . bisacodyl (DULCOLAX) 10 MG suppository Place 1 suppository (10 mg total) rectally daily. (Patient not taking: Reported on 08/23/2015) 12 suppository 0  . megestrol (MEGACE) 400 MG/10ML suspension Take 10 mLs (400 mg total) by mouth 2 (two) times daily. 240 mL 0   No current facility-administered medications for this visit.     Allergies: No Known Allergies   Physical Exam: Blood pressure 122/50, pulse 116, temperature 98.1 F (36.7 C), temperature source Oral, resp. rate 18, height 5\' 6"  (1.676 m), weight 118 lb 8 oz (53.751 kg), SpO2 100 %. ECOG: 1 General appearance: alert and cooperative chronically ill-appearing appeared without distress today. Head: Normocephalic, without obvious abnormality no oral ulcers or lesions. Neck: no adenopathy Lymph nodes: Cervical, supraclavicular, and axillary nodes normal. Heart:regular rate and rhythm, S1, S2 normal, no murmur, click, rub or gallop Lung:chest clear, no wheezing, rales, normal symmetric air entry Abdomin: soft, non-tender, without masses or organomegaly shifting dullness is ascites. EXT:no erythema, induration, or nodules   Lab Results: Lab Results  Component Value Date   WBC 11.8* 08/30/2015   HGB 6.3* 08/30/2015   HCT 20.4* 08/30/2015   MCV  88.3 08/30/2015   PLT 271 08/30/2015     Chemistry      Component Value Date/Time   NA 137 08/09/2015 0854   NA 139 08/06/2015 0415   K 4.0 08/09/2015 0854   K 4.1 08/06/2015 0415   CL 102 08/06/2015 0415   CO2 25 08/09/2015 0854   CO2 30 08/06/2015 0415   BUN 9.4 08/09/2015 0854   BUN 9 08/06/2015 0415   CREATININE 0.6* 08/09/2015 0854   CREATININE 0.45* 08/06/2015 0415      Component Value Date/Time   CALCIUM 9.1 08/09/2015 0854   CALCIUM 8.6* 08/06/2015 0415   ALKPHOS 429* 08/09/2015 0854   ALKPHOS 309* 08/05/2015 1730   AST 16 08/09/2015 0854   AST 54* 08/05/2015 1730   ALT 13 08/09/2015 0854   ALT 25 08/05/2015 1730   BILITOT 1.05 08/09/2015 0854   BILITOT 0.7 08/05/2015 1730         Impression and Plan:  70 year old gentleman with the following issues:  1. Castration resistant metastatic prostate cancer with disease to the bone.  His initial diagnosis was in April 2015 with a PSA of 368 and a Gleason score of 4+5 = 9. He was initially treated with Lupron initially and Casodex added in March 2016 after his PSA started to rise. His initial response in his PSA went down to 34 and now is up to 206. His staging workup including a bone scan obtained on 02/28/2015 showed progression of disease.  He is currently on Taxotere chemotherapy and received 2 cycles of therapy and PSA dropped from 206 to 185. After missing chemotherapy due to hospitalization, he resumed Taxotere at a lower dose starting in December 2016. The plan is to proceed with cycle 4 today and follow his PSA closely.  2. IV access: Port-A-Cath inserted without any complications. EMLA cream is available to the patient without any complication to the Port-A-Cath.  3. Nausea prophylaxis: Compazine is available to to him and instructions how to use it was reiterated today.  4. Neutropenia prophylaxis: He will receive Neulasta after each injection. Arthralgias, myalgias and bone pain are potential complications  were reviewed with the patient. Oxycodone and Claritin can be used to alleviate some of the symptoms. Oxycodone was refilled to him today.  5. Bone health: He is on calcium and vitamin D. Delton See will be started once dental clearance has been obtained.  6. Weight loss: This is exacerbated by his recent hospitalization but certainly improving. Prescription for Megace was given to the patient today.  7. Bilateral leg Pain: His currently on OxyContin and oxycodone. He is also under evaluation to start palliative radiation therapy in the near future.  8. Prognosis: Treatment goal is palliative and he understands that he has a rather aggressive disease with potentially limited life expectancy. He wants to continue aggressive therapy for the time being.  9. Anemia: His hemoglobin is low today and he is mildly symptomatic. I will set him up with 2 units of packed red cell transfusion in the near future.  10. Hormone therapy: I have recommended indefinite androgen deprivation and we'll monitor his testosterone periodically and administer Lupron at that time.  11. Follow-up: Will be in 3 weeks for the next cycle of chemotherapy.  [facility], MD 1/11/201710:41 AM

## 2015-08-31 ENCOUNTER — Ambulatory Visit
Admission: RE | Admit: 2015-08-31 | Discharge: 2015-08-31 | Disposition: A | Discharge status: Home / Self Care | Payer: PPO | Source: Ambulatory Visit | Attending: Radiation Oncology | Admitting: Radiation Oncology

## 2015-08-31 DIAGNOSIS — Z510 Encounter for antineoplastic radiation therapy: Secondary | ICD-10-CM | POA: Diagnosis not present

## 2015-08-31 LAB — TYPE AND SCREEN
ABO/RH(D): A POS
Antibody Screen: NEGATIVE
UNIT DIVISION: 0
Unit division: 0

## 2015-08-31 LAB — PSA (PARALLEL TESTING): PSA: 207.8 ng/mL — ABNORMAL HIGH (ref <=4.00)

## 2015-08-31 LAB — PSA: PROSTATE SPECIFIC AG, SERUM: 215.6 ng/mL — AB (ref 0.0–4.0)

## 2015-09-01 ENCOUNTER — Ambulatory Visit (HOSPITAL_BASED_OUTPATIENT_CLINIC_OR_DEPARTMENT_OTHER): Payer: PPO

## 2015-09-01 ENCOUNTER — Ambulatory Visit
Admission: RE | Admit: 2015-09-01 | Discharge: 2015-09-01 | Disposition: A | Discharge status: Home / Self Care | Payer: PPO | Source: Ambulatory Visit | Attending: Radiation Oncology | Admitting: Radiation Oncology

## 2015-09-01 VITALS — BP 156/61 | HR 115 | Temp 98.3°F

## 2015-09-01 DIAGNOSIS — C61 Malignant neoplasm of prostate: Secondary | ICD-10-CM

## 2015-09-01 DIAGNOSIS — Z510 Encounter for antineoplastic radiation therapy: Secondary | ICD-10-CM | POA: Diagnosis not present

## 2015-09-01 DIAGNOSIS — Z5189 Encounter for other specified aftercare: Secondary | ICD-10-CM | POA: Diagnosis not present

## 2015-09-01 MED ORDER — PEGFILGRASTIM INJECTION 6 MG/0.6ML ~~LOC~~
6.0000 mg | PREFILLED_SYRINGE | Freq: Once | SUBCUTANEOUS | Status: AC
Start: 2015-09-01 — End: 2015-09-01
  Administered 2015-09-01: 6 mg via SUBCUTANEOUS
  Filled 2015-09-01: qty 0.6

## 2015-09-03 ENCOUNTER — Telehealth: Payer: Self-pay | Admitting: Oncology

## 2015-09-03 NOTE — Telephone Encounter (Signed)
Placed a note on rad onc appointment for patient to get an updated schedule for dr Alen Blew for appointments at end of [facility]

## 2015-09-04 ENCOUNTER — Ambulatory Visit
Admission: RE | Admit: 2015-09-04 | Discharge: 2015-09-04 | Disposition: A | Discharge status: Home / Self Care | Payer: PPO | Source: Ambulatory Visit | Attending: Radiation Oncology | Admitting: Radiation Oncology

## 2015-09-04 DIAGNOSIS — Z510 Encounter for antineoplastic radiation therapy: Secondary | ICD-10-CM | POA: Diagnosis not present

## 2015-09-05 ENCOUNTER — Encounter: Payer: Self-pay | Admitting: Radiation Oncology

## 2015-09-05 ENCOUNTER — Ambulatory Visit
Admission: RE | Admit: 2015-09-05 | Discharge: 2015-09-05 | Disposition: A | Discharge status: Home / Self Care | Payer: PPO | Source: Ambulatory Visit | Attending: Radiation Oncology | Admitting: Radiation Oncology

## 2015-09-05 VITALS — BP 154/85 | HR 109 | Temp 98.3°F | Ht 66.0 in

## 2015-09-05 DIAGNOSIS — C61 Malignant neoplasm of prostate: Secondary | ICD-10-CM | POA: Diagnosis present

## 2015-09-05 MED ORDER — OXYCODONE HCL ER 15 MG PO T12A: 15.0000 mg | EXTENDED_RELEASE_TABLET | Freq: Two times a day (BID) | ORAL | Status: DC

## 2015-09-05 NOTE — Progress Notes (Signed)
Pt here for patient teaching.  Pt given Radiation and You booklet and skin care instructions. Pt reports they have not watched the Radiation Therapy Education video, and were given the link today to watch at home..  Reviewed areas of pertinence such as diarrhea, fatigue, hair loss, nausea and vomiting, sexual and fertility changes, skin changes, urinary and bladder changes, breast swelling, cough, shortness of breath, earaches and taste changes . Pt able to give teach back of to pat skin, use unscented/gentle soap, have Imodium on hand and drink plenty of water,. Pt verbalizes understanding of information given and will contact nursing with any questions or concerns.     [email or web address]

## 2015-09-05 NOTE — Progress Notes (Signed)
  Radiation Oncology         (336) [phone] ________________________________  Name: KENTRAVIOUS BRUINSMA MRN: [id number]  Date: 09/05/2015  DOB: July 21, 1946  Weekly Radiation Therapy Management    ICD-9-CM ICD-10-CM   1. Prostate cancer ([place]) 185 C61      Current Dose: 15 Gy     Planned Dose:  30 Gy  Narrative . . . . . . . . The patient presents for routine under treatment assessment.                                   The patient is without complaint. His pain level in the pelvis and femur area has already improved. Patient is running out of his long-acting narcotic and I've refilled this today. He denies any nausea or bladder or bowel issues                                 Set-up films were reviewed.                                 The chart was checked. Physical Findings. . .  height is 5\' 6"  (1.676 m). His temperature is 98.3 F (36.8 C). His blood pressure is 154/85 and his pulse is 109. . Weight essentially stable.  No significant changes. The lungs are clear. The heart has a regular rhythm and rate. The abdomen is soft and nontender with normal bowel sounds. Impression . . . . . . . The patient is tolerating radiation. Plan . . . . . . . . . . . . Continue treatment as planned.  ________________________________   Blair Promise, PhD, MD

## 2015-09-05 NOTE — Progress Notes (Signed)
Mr. Lerew is here for his 4th fraction of radiation to his Left Pelvis and Right Femur. He denies pain at this time. He does take oxycodone every 4 hours for his pain with relief. He has a prescription for oxycontin written by the hospitalist, but does not have any at his house per his report. He is not sure where the prescription is. He is using a walker to ambulate, and reports his legs are weak today. He is able to complete his normal daily activities. He denies any urinary or bowel difficulties.   BP 154/85 mmHg  Pulse 109  Temp(Src) 98.3 F (36.8 C)  Ht 5\' 6"  (1.676 m)

## 2015-09-06 ENCOUNTER — Ambulatory Visit
Admission: RE | Admit: 2015-09-06 | Discharge: 2015-09-06 | Disposition: A | Discharge status: Home / Self Care | Payer: PPO | Source: Ambulatory Visit | Attending: Radiation Oncology | Admitting: Radiation Oncology

## 2015-09-06 DIAGNOSIS — Z510 Encounter for antineoplastic radiation therapy: Secondary | ICD-10-CM | POA: Diagnosis not present

## 2015-09-07 ENCOUNTER — Ambulatory Visit
Admission: RE | Admit: 2015-09-07 | Discharge: 2015-09-07 | Disposition: A | Discharge status: Home / Self Care | Payer: PPO | Source: Ambulatory Visit | Attending: Radiation Oncology | Admitting: Radiation Oncology

## 2015-09-07 DIAGNOSIS — Z510 Encounter for antineoplastic radiation therapy: Secondary | ICD-10-CM | POA: Diagnosis not present

## 2015-09-08 ENCOUNTER — Ambulatory Visit
Admission: RE | Admit: 2015-09-08 | Discharge: 2015-09-08 | Disposition: A | Discharge status: Home / Self Care | Payer: PPO | Source: Ambulatory Visit | Attending: Radiation Oncology | Admitting: Radiation Oncology

## 2015-09-08 DIAGNOSIS — Z510 Encounter for antineoplastic radiation therapy: Secondary | ICD-10-CM | POA: Diagnosis not present

## 2015-09-11 ENCOUNTER — Ambulatory Visit
Admission: RE | Admit: 2015-09-11 | Discharge: 2015-09-11 | Disposition: A | Discharge status: Home / Self Care | Payer: PPO | Source: Ambulatory Visit | Attending: Radiation Oncology | Admitting: Radiation Oncology

## 2015-09-11 DIAGNOSIS — Z510 Encounter for antineoplastic radiation therapy: Secondary | ICD-10-CM | POA: Diagnosis not present

## 2015-09-12 ENCOUNTER — Other Ambulatory Visit: Payer: Self-pay

## 2015-09-12 ENCOUNTER — Emergency Department (HOSPITAL_COMMUNITY)
Admission: EM | Admit: 2015-09-12 | Discharge: 2015-09-13 | Disposition: A | Discharge status: Home / Self Care | Payer: PPO | Attending: Emergency Medicine | Admitting: Emergency Medicine

## 2015-09-12 ENCOUNTER — Encounter: Payer: Self-pay | Admitting: Radiation Oncology

## 2015-09-12 ENCOUNTER — Ambulatory Visit
Admission: RE | Admit: 2015-09-12 | Discharge: 2015-09-12 | Disposition: A | Discharge status: Home / Self Care | Payer: PPO | Source: Ambulatory Visit | Attending: Radiation Oncology | Admitting: Radiation Oncology

## 2015-09-12 ENCOUNTER — Encounter (HOSPITAL_COMMUNITY): Payer: Self-pay

## 2015-09-12 VITALS — BP 168/79 | HR 108 | Temp 97.6°F | Resp 20 | Wt 124.1 lb

## 2015-09-12 DIAGNOSIS — Z794 Long term (current) use of insulin: Secondary | ICD-10-CM | POA: Diagnosis not present

## 2015-09-12 DIAGNOSIS — E876 Hypokalemia: Secondary | ICD-10-CM | POA: Insufficient documentation

## 2015-09-12 DIAGNOSIS — Z8673 Personal history of transient ischemic attack (TIA), and cerebral infarction without residual deficits: Secondary | ICD-10-CM | POA: Diagnosis not present

## 2015-09-12 DIAGNOSIS — E119 Type 2 diabetes mellitus without complications: Secondary | ICD-10-CM | POA: Insufficient documentation

## 2015-09-12 DIAGNOSIS — Z510 Encounter for antineoplastic radiation therapy: Secondary | ICD-10-CM | POA: Diagnosis not present

## 2015-09-12 DIAGNOSIS — Z7984 Long term (current) use of oral hypoglycemic drugs: Secondary | ICD-10-CM | POA: Insufficient documentation

## 2015-09-12 DIAGNOSIS — R000 Tachycardia, unspecified: Secondary | ICD-10-CM | POA: Diagnosis not present

## 2015-09-12 DIAGNOSIS — R531 Weakness: Secondary | ICD-10-CM | POA: Insufficient documentation

## 2015-09-12 DIAGNOSIS — C7951 Secondary malignant neoplasm of bone: Principal | ICD-10-CM

## 2015-09-12 DIAGNOSIS — Z7902 Long term (current) use of antithrombotics/antiplatelets: Secondary | ICD-10-CM | POA: Insufficient documentation

## 2015-09-12 DIAGNOSIS — I10 Essential (primary) hypertension: Secondary | ICD-10-CM | POA: Diagnosis not present

## 2015-09-12 DIAGNOSIS — Z79899 Other long term (current) drug therapy: Secondary | ICD-10-CM | POA: Insufficient documentation

## 2015-09-12 DIAGNOSIS — R110 Nausea: Secondary | ICD-10-CM | POA: Diagnosis not present

## 2015-09-12 DIAGNOSIS — Z8546 Personal history of malignant neoplasm of prostate: Secondary | ICD-10-CM | POA: Insufficient documentation

## 2015-09-12 DIAGNOSIS — C61 Malignant neoplasm of prostate: Secondary | ICD-10-CM

## 2015-09-12 DIAGNOSIS — Z7982 Long term (current) use of aspirin: Secondary | ICD-10-CM | POA: Insufficient documentation

## 2015-09-12 DIAGNOSIS — Z85830 Personal history of malignant neoplasm of bone: Secondary | ICD-10-CM | POA: Diagnosis not present

## 2015-09-12 LAB — BASIC METABOLIC PANEL
ANION GAP: 8 (ref 5–15)
BUN: 6 mg/dL (ref 6–20)
CALCIUM: 8.6 mg/dL — AB (ref 8.9–10.3)
CO2: 30 mmol/L (ref 22–32)
Chloride: 101 mmol/L (ref 101–111)
Creatinine, Ser: 0.35 mg/dL — ABNORMAL LOW (ref 0.61–1.24)
GLUCOSE: 127 mg/dL — AB (ref 65–99)
POTASSIUM: 3.1 mmol/L — AB (ref 3.5–5.1)
Sodium: 139 mmol/L (ref 135–145)

## 2015-09-12 LAB — CBC WITH DIFFERENTIAL/PLATELET
BASOS ABS: 0 10*3/uL (ref 0.0–0.1)
Basophils Relative: 0 %
EOS PCT: 0 %
Eosinophils Absolute: 0 10*3/uL (ref 0.0–0.7)
HEMATOCRIT: 22.9 % — AB (ref 39.0–52.0)
Hemoglobin: 7.4 g/dL — ABNORMAL LOW (ref 13.0–17.0)
Lymphocytes Relative: 5 %
Lymphs Abs: 0.8 10*3/uL (ref 0.7–4.0)
MCH: 28.4 pg (ref 26.0–34.0)
MCHC: 32.3 g/dL (ref 30.0–36.0)
MCV: 87.7 fL (ref 78.0–100.0)
MONOS PCT: 3 %
Monocytes Absolute: 0.5 10*3/uL (ref 0.1–1.0)
NEUTROS PCT: 92 %
Neutro Abs: 15.2 10*3/uL — ABNORMAL HIGH (ref 1.7–7.7)
PLATELETS: 59 10*3/uL — AB (ref 150–400)
RBC: 2.61 MIL/uL — AB (ref 4.22–5.81)
RDW: 15.5 % (ref 11.5–15.5)
WBC: 16.5 10*3/uL — AB (ref 4.0–10.5)

## 2015-09-12 LAB — URINALYSIS, ROUTINE W REFLEX MICROSCOPIC
BILIRUBIN URINE: NEGATIVE
GLUCOSE, UA: 100 mg/dL — AB
HGB URINE DIPSTICK: NEGATIVE
KETONES UR: NEGATIVE mg/dL
Leukocytes, UA: NEGATIVE
Nitrite: NEGATIVE
PH: 8 (ref 5.0–8.0)
PROTEIN: 100 mg/dL — AB
Specific Gravity, Urine: 1.01 (ref 1.005–1.030)

## 2015-09-12 LAB — CBG MONITORING, ED: Glucose-Capillary: 118 mg/dL — ABNORMAL HIGH (ref 65–99)

## 2015-09-12 LAB — URINE MICROSCOPIC-ADD ON: BACTERIA UA: NONE SEEN

## 2015-09-12 MED ORDER — ONDANSETRON HCL 4 MG/2ML IJ SOLN
4.0000 mg | Freq: Once | INTRAMUSCULAR | Status: AC
  Administered 2015-09-12: 4 mg via INTRAVENOUS
  Filled 2015-09-12: qty 2

## 2015-09-12 MED ORDER — MORPHINE SULFATE (PF) 4 MG/ML IV SOLN
4.0000 mg | Freq: Once | INTRAVENOUS | Status: AC
  Administered 2015-09-12: 4 mg via INTRAVENOUS
  Filled 2015-09-12: qty 1

## 2015-09-12 MED ORDER — SODIUM CHLORIDE 0.9 % IV BOLUS (SEPSIS)
1000.0000 mL | Freq: Once | INTRAVENOUS | Status: AC
  Administered 2015-09-12: 1000 mL via INTRAVENOUS

## 2015-09-12 MED ORDER — ACETAMINOPHEN 500 MG PO TABS
1000.0000 mg | ORAL_TABLET | Freq: Once | ORAL | Status: AC
  Administered 2015-09-12: 1000 mg via ORAL
  Filled 2015-09-12: qty 2

## 2015-09-12 MED ORDER — HEPARIN SOD (PORK) LOCK FLUSH 100 UNIT/ML IV SOLN
500.0000 [IU] | Freq: Once | INTRAVENOUS | Status: AC
  Administered 2015-09-12: 500 [IU]
  Filled 2015-09-12: qty 5

## 2015-09-12 MED ORDER — POTASSIUM CHLORIDE CRYS ER 20 MEQ PO TBCR
40.0000 meq | EXTENDED_RELEASE_TABLET | Freq: Once | ORAL | Status: AC
  Administered 2015-09-12: 40 meq via ORAL
  Filled 2015-09-12: qty 2

## 2015-09-12 NOTE — ED Notes (Signed)
UNABLE TO COLLECT LABS AT THIS TIME PATIENT WANTS HIS PORT ACCESS.

## 2015-09-12 NOTE — Discharge Instructions (Signed)
Eat 2 bananas a day. Hypokalemia Hypokalemia means that the amount of potassium in the blood is lower than normal.Potassium is a chemical, called an electrolyte, that helps regulate the amount of fluid in the body. It also stimulates muscle contraction and helps nerves function properly.Most of the body's potassium is inside of cells, and only a very small amount is in the blood. Because the amount in the blood is so small, minor changes can be life-threatening. CAUSES  Antibiotics.  Diarrhea or vomiting.  Using laxatives too much, which can cause diarrhea.  Chronic kidney disease.  Water pills (diuretics).  Eating disorders (bulimia).  Low magnesium level.  Sweating a lot. SIGNS AND SYMPTOMS  Weakness.  Constipation.  Fatigue.  Muscle cramps.  Mental confusion.  Skipped heartbeats or irregular heartbeat (palpitations).  Tingling or numbness. DIAGNOSIS  Your health care provider can diagnose hypokalemia with blood tests. In addition to checking your potassium level, your health care provider may also check other lab tests. TREATMENT Hypokalemia can be treated with potassium supplements taken by mouth or adjustments in your current medicines. If your potassium level is very low, you may need to get potassium through a vein (IV) and be monitored in the hospital. A diet high in potassium is also helpful. Foods high in potassium are:  Nuts, such as peanuts and pistachios.  Seeds, such as sunflower seeds and pumpkin seeds.  Peas, lentils, and lima beans.  Whole grain and bran cereals and breads.  Fresh fruit and vegetables, such as apricots, avocado, bananas, cantaloupe, kiwi, oranges, tomatoes, asparagus, and potatoes.  Orange and tomato juices.  Red meats.  Fruit yogurt. HOME CARE INSTRUCTIONS  Take all medicines as prescribed by your health care provider.  Maintain a healthy diet by including nutritious food, such as fruits, vegetables, nuts, whole grains,  and lean meats.  If you are taking a laxative, be sure to follow the directions on the label. SEEK MEDICAL CARE IF:  Your weakness gets worse.  You feel your heart pounding or racing.  You are vomiting or having diarrhea.  You are diabetic and having trouble keeping your blood glucose in the normal range. SEEK IMMEDIATE MEDICAL CARE IF:  You have chest pain, shortness of breath, or dizziness.  You are vomiting or having diarrhea for more than 2 days.  You faint. MAKE SURE YOU:   Understand these instructions.  Will watch your condition.  Will get help right away if you are not doing well or get worse.   This information is not intended to replace advice given to you by your health care provider. Make sure you discuss any questions you have with your health care provider.   Document Released: 08/05/2005 Document Revised: 08/26/2014 Document Reviewed: 02/05/2013 Elsevier Interactive Patient Education [employer].

## 2015-09-12 NOTE — Progress Notes (Signed)
  Radiation Oncology         [phone]) [phone] ________________________________  Name: BETTIE QUEBODEAUX MRN: [id number]  Date: 09/12/2015  DOB: Aug 18, 1946  Weekly Radiation Therapy Management    ICD-9-CM ICD-10-CM   1. Neoplasm of prostate, distant metastasis staging category M1b: metastasis to bone ([facility]) 185 C61    198.5 C79.51      Current Dose: 30 Gy     Planned Dose:  30 Gy  Narrative . . . . . . . . The patient presents for routine under treatment assessment.                                   The patient is without complaint. Essentially pain free at this time                                 Set-up films were reviewed.                                 The chart was checked. Physical Findings. . .  weight is 124 lb 1.6 oz (56.291 kg). His oral temperature is 97.6 F (36.4 C). His blood pressure is 168/79 and his pulse is 108. His respiration is 20. . Weight essentially stable.  No significant changes. The lungs are clear. The heart has a regular rhythm and rate. The abdomen is soft and nontender with normal bowel sounds. Impression . . . . . . . The patient is tolerating radiation. Plan . . . . . . . . . . . . Continue treatment as planned.  ________________________________   Blair Promise, PhD, MD

## 2015-09-12 NOTE — Progress Notes (Addendum)
wee4kly rad txsleft pelvis and  , right hip, 10/10, no c/o pain or discomfort, regular bowels takes senokot, no bladder issues stated, appetite so so ,drinks ensure alive bid between meals,  Energy level  Okay , 1 month follow up appt card given 10:23 AM BP 168/79 mmHg  Pulse 108  Temp(Src) 97.6 F (36.4 C) (Oral)  Resp 20  Wt 124 lb 1.6 oz (56.291 kg)  Wt Readings from Last 3 Encounters:  09/12/15 124 lb 1.6 oz (56.291 kg)  08/30/15 118 lb 8 oz (53.751 kg)  08/23/15 132 lb 8 oz (60.102 kg)

## 2015-09-12 NOTE — ED Notes (Signed)
Patient c/o dizziness, nausea, and lower back pain. Patient has a history of prostate cancer. Patient states he just finished his radiation today and that is why he is having his symptoms.

## 2015-09-12 NOTE — ED Notes (Addendum)
Lab delay - Pt has port - would like that accessed.

## 2015-09-12 NOTE — ED Provider Notes (Signed)
CSN: [id number]     Arrival date & time 09/12/15  1249 History   First MD Initiated Contact with Patient 09/12/15 1756     Chief Complaint  Patient presents with  . Dizziness  . Nausea  . Back Pain  . Weakness     (Consider location/radiation/quality/duration/timing/severity/associated sxs/prior Treatment) Patient is a 70 y.o. male presenting with general illness. The history is provided by the patient.  Illness Severity:  Moderate Onset quality:  Sudden Duration:  1 day Timing:  Constant Progression:  Unchanged Chronicity:  New Associated symptoms: nausea   Associated symptoms: no abdominal pain, no chest pain, no congestion, no diarrhea, no fever, no headaches, no myalgias, no rash, no shortness of breath and no vomiting     70 yo M with a chief complaint of generalized pain after radiation therapy this morning.  Some nausea denies vomiting.  Denies fevers, chills. Denies cough congestion shortness of breath. Patient with mild tachycardia that looks like a chronic issue for him. Patient denies any sudden abdominal pain having some lower back cramping. Denies dysuria.  Past Medical History  Diagnosis Date  . Stroke ([place])   . Diabetes mellitus   . Hypertension   . Prostate cancer ([place])   . Bone metastases ([place])   . Prostate cancer [facility])    Past Surgical History  Procedure Laterality Date  . Eye surgery     Family History  Problem Relation Age of Onset  . Cancer Mother   . Hypertension Mother   . Hypertension Father   . Cancer Father   . Diabetes Sister   . Hypertension Sister   . Hypertension Brother   . Diabetes Brother    Social History  Substance Use Topics  . Smoking status: Never Smoker   . Smokeless tobacco: Never Used  . Alcohol Use: No    Review of Systems  Constitutional: Negative for fever and chills.  HENT: Negative for congestion and facial swelling.   Eyes: Negative for discharge and visual disturbance.  Respiratory: Negative for shortness of  breath.   Cardiovascular: Negative for chest pain and palpitations.  Gastrointestinal: Positive for nausea. Negative for vomiting, abdominal pain and diarrhea.  Musculoskeletal: Negative for myalgias and arthralgias.  Skin: Negative for color change and rash.  Neurological: Positive for weakness and light-headedness. Negative for tremors, syncope and headaches.  Psychiatric/Behavioral: Negative for confusion and dysphoric mood.      Allergies  Review of patient's allergies indicates no known allergies.  Home Medications   Prior to Admission medications   Medication Sig Start Date End Date Taking? Authorizing Provider  aspirin 81 MG chewable tablet Chew 1 tablet (81 mg total) by mouth daily. 06/14/15  Yes Verlee Monte, MD  atorvastatin (LIPITOR) 40 MG tablet Take 1 tablet (40 mg total) by mouth at bedtime. 06/14/15  Yes Verlee Monte, MD  cholecalciferol (VITAMIN D) 400 UNITS TABS tablet Take 400 Units by mouth daily.   Yes Historical Provider, MD  clopidogrel (PLAVIX) 75 MG tablet Take 1 tablet (75 mg total) by mouth daily with breakfast. 06/14/15  Yes Verlee Monte, MD  dexamethasone (DECADRON) 4 MG tablet Take 1 tablet (4 mg total) by mouth 3 (three) times daily. 07/19/15  Yes Theodis Blaze, MD  feeding supplement, ENSURE ENLIVE, (ENSURE ENLIVE) LIQD Take 237 mLs by mouth 2 (two) times daily between meals. 08/06/15  Yes Barton Dubois, MD  furosemide (LASIX) 20 MG tablet Take 1 tablet (20 mg total) by mouth daily. 08/23/15  Yes Jeneen Rinks  Kinard, MD  glipiZIDE (GLUCOTROL) 10 MG tablet Take 1 tablet (10 mg total) by mouth daily before breakfast. 06/14/15  Yes Verlee Monte, MD  insulin aspart (NOVOLOG) 100 UNIT/ML injection Inject 0-20 Units into the skin 3 (three) times daily with meals. Patient taking differently: Inject 10-25 Units into the skin 3 (three) times daily with meals.  07/19/15  Yes Theodis Blaze, MD  Insulin Detemir (LEVEMIR) 100 UNIT/ML Pen Inject 10 Units into the skin daily.  08/06/15  Yes Barton Dubois, MD  iron polysaccharides (NIFEREX) 150 MG capsule Take 1 capsule (150 mg total) by mouth daily. 08/06/15  Yes Barton Dubois, MD  lidocaine-prilocaine (EMLA) cream Apply 1 application topically as needed. 05/23/15  Yes Wyatt Portela, MD  losartan (COZAAR) 50 MG tablet Take 1 tablet (50 mg total) by mouth daily. 06/14/15  Yes Verlee Monte, MD  megestrol (MEGACE) 400 MG/10ML suspension Take 10 mLs (400 mg total) by mouth 2 (two) times daily. 08/30/15  Yes Wyatt Portela, MD  metFORMIN (GLUCOPHAGE) 1000 MG tablet Take 1 tablet (1,000 mg total) by mouth 2 (two) times daily. 06/14/15  Yes Verlee Monte, MD  oxyCODONE (OXY IR/ROXICODONE) 5 MG immediate release tablet Take 1 tablet (5 mg total) by mouth every 4 (four) hours as needed for moderate pain. 08/30/15  Yes Wyatt Portela, MD  oxyCODONE (OXYCONTIN) 15 mg 12 hr tablet Take 1 tablet (15 mg total) by mouth every 12 (twelve) hours. 09/05/15  Yes Gery Pray, MD  prochlorperazine (COMPAZINE) 10 MG tablet Take 1 tablet (10 mg total) by mouth every 6 (six) hours as needed for nausea or vomiting. 06/28/15  Yes Wyatt Portela, MD  senna-docusate (SENOKOT-S) 8.6-50 MG tablet Take 1 tablet by mouth 2 (two) times daily. 07/16/15  Yes Theodis Blaze, MD  traZODone (DESYREL) 50 MG tablet Take 0.5 tablets (25 mg total) by mouth at bedtime as needed for sleep. 08/30/15  Yes Wyatt Portela, MD  bisacodyl (DULCOLAX) 10 MG suppository Place 1 suppository (10 mg total) rectally daily. Patient not taking: Reported on 08/23/2015 07/19/15   Theodis Blaze, MD   BP 147/63 mmHg  Pulse 102  Temp(Src) 98.3 F (36.8 C) (Oral)  Resp 14  SpO2 94% Physical Exam  Constitutional: He is oriented to person, place, and time. He appears well-developed and well-nourished.  HENT:  Head: Normocephalic and atraumatic.  Eyes: EOM are normal. Pupils are equal, round, and reactive to light.  Neck: Normal range of motion. Neck supple. No JVD present.   Cardiovascular: Regular rhythm.  Tachycardia present.  Exam reveals no gallop and no friction rub.   No murmur heard. Pulmonary/Chest: No respiratory distress. He has no wheezes. He has no rales.  Abdominal: He exhibits no distension. There is no rebound and no guarding.  Musculoskeletal: Normal range of motion.  Neurological: He is alert and oriented to person, place, and time.  Skin: No rash noted. No pallor.  Psychiatric: He has a normal mood and affect. His behavior is normal.  Nursing note and vitals reviewed.   ED Course  Procedures (including critical care time) Labs Review Labs Reviewed  URINALYSIS, ROUTINE W REFLEX MICROSCOPIC (NOT AT [facility]) - Abnormal; Notable for the following:    Glucose, UA 100 (*)    Protein, ur 100 (*)    All other components within normal limits  URINE MICROSCOPIC-ADD ON - Abnormal; Notable for the following:    Squamous Epithelial / LPF 0-5 (*)    All other components  within normal limits  CBC WITH DIFFERENTIAL/PLATELET - Abnormal; Notable for the following:    WBC 16.5 (*)    RBC 2.61 (*)    Hemoglobin 7.4 (*)    HCT 22.9 (*)    Platelets 59 (*)    Neutro Abs 15.2 (*)    All other components within normal limits  BASIC METABOLIC PANEL - Abnormal; Notable for the following:    Potassium 3.1 (*)    Glucose, Bld 127 (*)    Creatinine, Ser 0.35 (*)    Calcium 8.6 (*)    All other components within normal limits  CBG MONITORING, ED - Abnormal; Notable for the following:    Glucose-Capillary 118 (*)    All other components within normal limits    Imaging Review No results found. I have personally reviewed and evaluated these images and lab results as part of my medical decision-making.   EKG Interpretation None      MDM   Final diagnoses:  Hypokalemia  Weakness    70 yo M with a chief complaints of generalized malaise lightheadedness. Patient has a history of prostate cancer and is getting radiation therapy. Has felt these  symptoms since this morning. Mild tachycardia otherwise with unremarkable vitals. Was given a liter of fluids check labs. UA negative.  No respiratory symptoms.   Labs were lost somewhere in the lab. We'll repeat.   Patient labs are unremarkable. Mild hypokalemia. Discussed with patient is feeling better after couple liters of fluids.  11:17 PM:  I have discussed the diagnosis/risks/treatment options with the patient and family and believe the pt to be eligible for discharge home to follow-up with PCP. We also discussed returning to the ED immediately if new or worsening sx occur. We discussed the sx which are most concerning (e.g., sudden worsening pain, fever, inability to tolerate by mouth) that necessitate immediate return. Medications administered to the patient during their visit and any new prescriptions provided to the patient are listed below.  Medications given during this visit Medications  potassium chloride SA (K-DUR,KLOR-CON) CR tablet 40 mEq (not administered)  sodium chloride 0.9 % bolus 1,000 mL (0 mLs Intravenous Stopped 09/12/15 2023)  ondansetron (ZOFRAN) injection 4 mg (4 mg Intravenous Given 09/12/15 1853)  acetaminophen (TYLENOL) tablet 1,000 mg (1,000 mg Oral Given 09/12/15 1853)  morphine 4 MG/ML injection 4 mg (4 mg Intravenous Given 09/12/15 1853)  sodium chloride 0.9 % bolus 1,000 mL (1,000 mLs Intravenous New Bag/Given 09/12/15 2206)    New Prescriptions   No medications on file    The patient appears reasonably screen and/or stabilized for discharge and I doubt any other medical condition or other [facility] requiring further screening, evaluation, or treatment in the ED at this time prior to discharge.    Deno Etienne, DO 09/12/15 2318

## 2015-09-17 NOTE — Progress Notes (Signed)
  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   Minimal fatigue. The patient had improvement in his pain in these areas.  Plan: The patient has completed radiation treatment. The patient will return to radiation oncology clinic for routine followup in one month. I advised them to call or return sooner if they have any questions or concerns related to their recovery or treatment.  -----------------------------------  Blair Promise, PhD, MD

## 2015-09-20 ENCOUNTER — Ambulatory Visit (HOSPITAL_BASED_OUTPATIENT_CLINIC_OR_DEPARTMENT_OTHER): Payer: PPO

## 2015-09-20 ENCOUNTER — Other Ambulatory Visit (HOSPITAL_BASED_OUTPATIENT_CLINIC_OR_DEPARTMENT_OTHER): Payer: PPO

## 2015-09-20 ENCOUNTER — Ambulatory Visit: Payer: PPO

## 2015-09-20 ENCOUNTER — Ambulatory Visit (HOSPITAL_BASED_OUTPATIENT_CLINIC_OR_DEPARTMENT_OTHER): Payer: PPO | Admitting: Oncology

## 2015-09-20 ENCOUNTER — Telehealth: Payer: Self-pay | Admitting: *Deleted

## 2015-09-20 ENCOUNTER — Ambulatory Visit (HOSPITAL_COMMUNITY)
Admission: RE | Admit: 2015-09-20 | Discharge: 2015-09-20 | Disposition: A | Discharge status: Home / Self Care | Payer: PPO | Source: Ambulatory Visit | Attending: Oncology | Admitting: Oncology

## 2015-09-20 ENCOUNTER — Telehealth: Payer: Self-pay | Admitting: Oncology

## 2015-09-20 ENCOUNTER — Other Ambulatory Visit: Payer: Self-pay | Admitting: *Deleted

## 2015-09-20 VITALS — BP 149/56 | HR 116 | Temp 97.8°F | Resp 17 | Ht 66.0 in | Wt 112.1 lb

## 2015-09-20 DIAGNOSIS — C7951 Secondary malignant neoplasm of bone: Secondary | ICD-10-CM

## 2015-09-20 DIAGNOSIS — D649 Anemia, unspecified: Secondary | ICD-10-CM

## 2015-09-20 DIAGNOSIS — C61 Malignant neoplasm of prostate: Secondary | ICD-10-CM

## 2015-09-20 DIAGNOSIS — Z5111 Encounter for antineoplastic chemotherapy: Secondary | ICD-10-CM | POA: Diagnosis not present

## 2015-09-20 DIAGNOSIS — Z95828 Presence of other vascular implants and grafts: Secondary | ICD-10-CM

## 2015-09-20 DIAGNOSIS — E291 Testicular hypofunction: Secondary | ICD-10-CM | POA: Diagnosis not present

## 2015-09-20 LAB — COMPREHENSIVE METABOLIC PANEL
ALBUMIN: 3.2 g/dL — AB (ref 3.5–5.0)
ALK PHOS: 581 U/L — AB (ref 40–150)
ALT: 10 U/L (ref 0–55)
ANION GAP: 11 meq/L (ref 3–11)
AST: 30 U/L (ref 5–34)
BUN: 9 mg/dL (ref 7.0–26.0)
CALCIUM: 9.1 mg/dL (ref 8.4–10.4)
CHLORIDE: 100 meq/L (ref 98–109)
CO2: 25 mEq/L (ref 22–29)
Creatinine: 0.7 mg/dL (ref 0.7–1.3)
EGFR: >90 mL/min/{1.73_m2} (ref >90)
Glucose: 482 mg/dl — ABNORMAL HIGH (ref 70–140)
POTASSIUM: 3.7 meq/L (ref 3.5–5.1)
Sodium: 136 mEq/L (ref 136–145)
Total Bilirubin: 0.45 mg/dL (ref 0.20–1.20)
Total Protein: 6.2 g/dL — ABNORMAL LOW (ref 6.4–8.3)

## 2015-09-20 LAB — CBC WITH DIFFERENTIAL/PLATELET
BASO%: 0.4 % (ref 0.0–2.0)
BASOS ABS: 0 10*3/uL (ref 0.0–0.1)
EOS ABS: 0 10*3/uL (ref 0.0–0.5)
EOS%: 0.1 % (ref 0.0–7.0)
HEMATOCRIT: 20.4 % — AB (ref 38.4–49.9)
HEMOGLOBIN: 6.4 g/dL — AB (ref 13.0–17.1)
LYMPH#: 0.5 10*3/uL — AB (ref 0.9–3.3)
LYMPH%: 5.7 % — ABNORMAL LOW (ref 14.0–49.0)
MCH: 27.8 pg (ref 27.2–33.4)
MCHC: 31.6 g/dL — AB (ref 32.0–36.0)
MCV: 88.1 fL (ref 79.3–98.0)
MONO#: 0.5 10*3/uL (ref 0.1–0.9)
MONO%: 5.8 % (ref 0.0–14.0)
NEUT#: 7.1 10*3/uL — ABNORMAL HIGH (ref 1.5–6.5)
NEUT%: 88 % — AB (ref 39.0–75.0)
PLATELETS: 59 10*3/uL — AB (ref 140–400)
RBC: 2.32 10*6/uL — ABNORMAL LOW (ref 4.20–5.82)
RDW: 16.2 % — ABNORMAL HIGH (ref 11.0–14.6)
WBC: 8.1 10*3/uL (ref 4.0–10.3)

## 2015-09-20 MED ORDER — SODIUM CHLORIDE 0.9% FLUSH
10.0000 mL | INTRAVENOUS | Status: DC | PRN
  Administered 2015-09-20: 10 mL via INTRAVENOUS
  Filled 2015-09-20: qty 10

## 2015-09-20 MED ORDER — SODIUM CHLORIDE 0.9 % IV SOLN
Freq: Once | INTRAVENOUS | Status: AC
  Administered 2015-09-20: 11:00:00 via INTRAVENOUS

## 2015-09-20 MED ORDER — DEXAMETHASONE SODIUM PHOSPHATE 100 MG/10ML IJ SOLN
Freq: Once | INTRAMUSCULAR | Status: AC
  Administered 2015-09-20: 11:00:00 via INTRAVENOUS
  Filled 2015-09-20: qty 4

## 2015-09-20 MED ORDER — HEPARIN SOD (PORK) LOCK FLUSH 100 UNIT/ML IV SOLN
500.0000 [IU] | Freq: Once | INTRAVENOUS | Status: AC | PRN
  Administered 2015-09-20: 500 [IU]
  Filled 2015-09-20: qty 5

## 2015-09-20 MED ORDER — MEGESTROL ACETATE 400 MG/10ML PO SUSP: 400.0000 mg | Freq: Two times a day (BID) | ORAL | Status: DC

## 2015-09-20 MED ORDER — SODIUM CHLORIDE 0.9 % IV SOLN
60.0000 mg/m2 | Freq: Once | INTRAVENOUS | Status: AC
  Administered 2015-09-20: 90 mg via INTRAVENOUS
  Filled 2015-09-20: qty 9

## 2015-09-20 MED ORDER — SODIUM CHLORIDE 0.9 % IJ SOLN
10.0000 mL | INTRAMUSCULAR | Status: DC | PRN
  Administered 2015-09-20: 10 mL
  Filled 2015-09-20: qty 10

## 2015-09-20 NOTE — Progress Notes (Signed)
REviewed labs. OK to treat with HGB of  6.4 and platelets of 59 k. per Dr. Alen Blew.  Pt aware of 12:30 appt tomorrow (09/21/15) for blood transfusion and voices understanding of keeping blue bracelet on.

## 2015-09-20 NOTE — Progress Notes (Signed)
OK to treat with HGB-6.4 and platelet count of 59 per Randolm Idol RN, per Dr. Alen Blew.  Pt to receive 2 units of PRBC's on 09/21/15.

## 2015-09-20 NOTE — Progress Notes (Signed)
Hematology and Oncology Follow Up Visit  Ricky Terry [id number] [birth year] 70 y.o. 09/20/2015 10:49 AM Ricky Terry, MDOsei-Bonsu, [name], MD   Principle Diagnosis: 70 year old gentleman with castration resistant metastatic prostate cancer with disease to the bone. His initial diagnosis was in April 2015 with a PSA of 368 and a Gleason score of 4+5 = 9. He has metastatic disease to the bone at the time of diagnosis..   Prior Therapy: He was treated with hormone therapy and his PSA initially dropped to 34 in September 2015. His PSA was up to 72 and February 2016 and Casodex was added. His most recent PSA was up to 77.9 on 02/28/2015 and 206 in October 2016. His repeat bone scan showed marked progression of his bony metastasis compared to his initial imaging studies.  He is status post palliative radiation therapy for a total of 30 gray in 10 fractions to the right proximal femur and the left video pelvic region. This concluded in 09/12/2015.  Current therapy: Taxotere chemotherapy at 75 mg per metered square every 3 weeks received cycle 1 on 06/07/2015. He is here to receive cycle 5 the dose was reduced to 60 mg/m for better tolerance.  Interim History: Ricky Terry presents today for a follow-up visit. Since the last visit, he reports feeling better after completing radiation therapy. He reports his pain is much improved and is able to ambulate much better. He is reporting dyspnea on exertion with extended period of ambulation.  He is reporting more fatigue and tiredness. He also reported decline in his appetite and further weight loss. He tolerated the last cycle of chemotherapy without any major complications. He denied any nausea, vomiting or abdominal pain. Does not report any worsening peripheral neuropathy.  His pain has been under reasonable control with the help of OxyContin twice a day and breakthrough oxycodone which she also uses 2-3 times a day.   He does not report any  headaches, blurry vision, syncope or seizures. He does not report any fevers, chills or sweats. He does not report any chest pain, palpitation orthopnea. Does not report any leg edema, cough, hemoptysis or wheezing. Does not report any nausea, vomiting, abdominal pain, hematochezia or melena. He does not report any frequency, urgency or hesitancy. Does not report any lymphadenopathy or petechiae. Remaining review of systems unremarkable  Medications: I have reviewed the patient's current medications.  No major changes by my review. Current Outpatient Prescriptions  Medication Sig Dispense Refill  . aspirin 81 MG chewable tablet Chew 1 tablet (81 mg total) by mouth daily. 30 tablet 0  . atorvastatin (LIPITOR) 40 MG tablet Take 1 tablet (40 mg total) by mouth at bedtime. 30 tablet 0  . bisacodyl (DULCOLAX) 10 MG suppository Place 1 suppository (10 mg total) rectally daily. (Patient not taking: Reported on 08/23/2015) 12 suppository 0  . cholecalciferol (VITAMIN D) 400 UNITS TABS tablet Take 400 Units by mouth daily.    . clopidogrel (PLAVIX) 75 MG tablet Take 1 tablet (75 mg total) by mouth daily with breakfast. 30 tablet 1  . dexamethasone (DECADRON) 4 MG tablet Take 1 tablet (4 mg total) by mouth 3 (three) times daily. 21 tablet 1  . feeding supplement, ENSURE ENLIVE, (ENSURE ENLIVE) LIQD Take 237 mLs by mouth 2 (two) times daily between meals. 237 mL 12  . furosemide (LASIX) 20 MG tablet Take 1 tablet (20 mg total) by mouth daily. 3 tablet 0  . glipiZIDE (GLUCOTROL) 10 MG tablet Take 1 tablet (10 mg total) by  mouth daily before breakfast. 30 tablet 0  . insulin aspart (NOVOLOG) 100 UNIT/ML injection Inject 0-20 Units into the skin 3 (three) times daily with meals. (Patient taking differently: Inject 10-25 Units into the skin 3 (three) times daily with meals. ) 10 mL 11  . Insulin Detemir (LEVEMIR) 100 UNIT/ML Pen Inject 10 Units into the skin daily. 15 mL 5  . iron polysaccharides (NIFEREX) 150 MG  capsule Take 1 capsule (150 mg total) by mouth daily. 30 capsule 2  . lidocaine-prilocaine (EMLA) cream Apply 1 application topically as needed. 30 g 0  . losartan (COZAAR) 50 MG tablet Take 1 tablet (50 mg total) by mouth daily. 30 tablet 0  . megestrol (MEGACE) 400 MG/10ML suspension Take 10 mLs (400 mg total) by mouth 2 (two) times daily. 240 mL 0  . metFORMIN (GLUCOPHAGE) 1000 MG tablet Take 1 tablet (1,000 mg total) by mouth 2 (two) times daily. 30 tablet 0  . oxyCODONE (OXY IR/ROXICODONE) 5 MG immediate release tablet Take 1 tablet (5 mg total) by mouth every 4 (four) hours as needed for moderate pain. 30 tablet 0  . oxyCODONE (OXYCONTIN) 15 mg 12 hr tablet Take 1 tablet (15 mg total) by mouth every 12 (twelve) hours. 60 tablet 0  . prochlorperazine (COMPAZINE) 10 MG tablet Take 1 tablet (10 mg total) by mouth every 6 (six) hours as needed for nausea or vomiting. 30 tablet 1  . senna-docusate (SENOKOT-S) 8.6-50 MG tablet Take 1 tablet by mouth 2 (two) times daily. 60 tablet 0  . traZODone (DESYREL) 50 MG tablet Take 0.5 tablets (25 mg total) by mouth at bedtime as needed for sleep. 30 tablet 0   No current facility-administered medications for this visit.     Allergies: No Known Allergies   Physical Exam: Blood pressure 149/56, pulse 116, temperature 97.8 F (36.6 C), temperature source Oral, resp. rate 17, height 5\' 6"  (1.676 m), weight 112 lb 1.6 oz (50.848 kg), SpO2 100 %. ECOG: 1 General appearance: alert and cooperative without distress today. Looks chronically ill-appearing. Head: Normocephalic, without obvious abnormality no oral thrush. Neck: no adenopathy Lymph nodes: Cervical, supraclavicular, and axillary nodes normal. Heart:regular rate and rhythm, S1, S2 normal, no murmur, click, rub or gallop Lung:chest clear, no wheezing, rales, normal symmetric air entry Abdomin: soft, non-tender, without masses or organomegaly no rebound or guarding. EXT:no erythema, induration, or  nodules   Lab Results: Lab Results  Component Value Date   WBC 8.1 09/20/2015   HGB 6.4* 09/20/2015   HCT 20.4* 09/20/2015   MCV 88.1 09/20/2015   PLT 59* 09/20/2015     Chemistry      Component Value Date/Time   NA 139 09/12/2015 2202   NA 135* 08/30/2015 0953   K 3.1* 09/12/2015 2202   K 4.3 08/30/2015 0953   CL 101 09/12/2015 2202   CO2 30 09/12/2015 2202   CO2 25 08/30/2015 0953   BUN 6 09/12/2015 2202   BUN 8.8 08/30/2015 0953   CREATININE 0.35* 09/12/2015 2202   CREATININE 0.8 08/30/2015 0953      Component Value Date/Time   CALCIUM 8.6* 09/12/2015 2202   CALCIUM 9.0 08/30/2015 0953   ALKPHOS 619* 08/30/2015 0953   ALKPHOS 309* 08/05/2015 1730   AST 27 08/30/2015 0953   AST 54* 08/05/2015 1730   ALT <9 08/30/2015 0953   ALT 25 08/05/2015 1730   BILITOT 0.51 08/30/2015 0953   BILITOT 0.7 08/05/2015 1730      Results for Ricky Terry,  Ricky Terry (MRN [id number]) as of 09/20/2015 10:08  Ref. Range 08/09/2015 08:54 08/30/2015 09:53  PSA Latest Ref Range: <=4.00 ng/mL 243.80 (H) 207.80 (H)     Impression and Plan:  70 year old gentleman with the following issues:  1. Castration resistant metastatic prostate cancer with disease to the bone. His initial diagnosis was in April 2015 with a PSA of 368 and a Gleason score of 4+5 = 9. He was initially treated with Lupron initially and Casodex added in March 2016 after his PSA started to rise. His initial response in his PSA went down to 34 and now is up to 206. His staging workup including a bone scan obtained on 02/28/2015 showed progression of disease.  He is currently on Taxotere chemotherapy and received 2 cycles of therapy and PSA dropped from 206 to 185. After missing chemotherapy due to hospitalization, he resumed Taxotere at a lower dose starting in December 2016. His PSA did go up to 248 but dropped again to 207 on 08/30/2015.   The plan is to proceed with cycle 4 today and follow his PSA closely. He continues to have  some benefit from systemic chemotherapy. No major complications at this time.  2. IV access: Port-A-Cath in place without any complications.  3. Nausea prophylaxis: Compazine is available to to him and instructions how to use it was reiterated today.  4. Neutropenia prophylaxis: He will receive Neulasta after each injection. No major complications associated with this injection.  5. Bone health: He is on calcium and vitamin D. Delton See will be started once dental clearance has been obtained.  6. Weight loss: This is exacerbated by his recent hospitalization but certainly improving. Prescription for Megace was given to the patient again today. He failed to fill his prescription on her previous visit.  7. Bilateral leg Pain: His currently on OxyContin and oxycodone. Pain much improved after radiation therapy.  8. Prognosis: Treatment goal is palliative and he understands that he has a rather aggressive disease with potentially limited life expectancy. He wants to continue aggressive therapy for the time being.  9. Anemia: His hemoglobin is low today and he is mildly symptomatic. He will receive 2 units of packed red cell transfusion before the end of the week.  10. Hormone therapy: I have recommended indefinite androgen deprivation and we'll monitor his testosterone periodically and administer Lupron at that time.  11. Follow-up: Will be in 3 weeks for the next cycle of chemotherapy.  [facility], MD 2/1/201710:49 AM

## 2015-09-20 NOTE — Patient Instructions (Signed)
[facility] Discharge Instructions for Patients Receiving Chemotherapy  Today you received the following chemotherapy agents Taxotere.  To help prevent nausea and vomiting after your treatment, we encourage you to take your nausea medication Compazine 10 mg every 6 hours as needed.  If you develop nausea and vomiting that is not controlled by your nausea medication, call the clinic.   BELOW ARE SYMPTOMS THAT SHOULD BE REPORTED IMMEDIATELY:  *FEVER GREATER THAN 100.5 F  *CHILLS WITH OR WITHOUT FEVER  NAUSEA AND VOMITING THAT IS NOT CONTROLLED WITH YOUR NAUSEA MEDICATION  *UNUSUAL SHORTNESS OF BREATH  *UNUSUAL BRUISING OR BLEEDING  TENDERNESS IN MOUTH AND THROAT WITH OR WITHOUT PRESENCE OF ULCERS  *URINARY PROBLEMS  *BOWEL PROBLEMS  UNUSUAL RASH Items with * indicate a potential emergency and should be followed up as soon as possible.  Feel free to call the clinic you have any questions or concerns. The clinic phone number is [phone].  Please show the CHEMO ALERT CARD at check-in to the Emergency Department and triage nurse.   

## 2015-09-20 NOTE — Telephone Encounter (Signed)
Per staff message and POF I have scheduled appts. Advised scheduler of appts. JMW  

## 2015-09-20 NOTE — Telephone Encounter (Signed)
Pt confirmed labs/ov per 02/01 POF, gave pt AVS and Calendar.Cherylann Banas, sent msg to add chemo and 5 hour blood for 02/03

## 2015-09-21 ENCOUNTER — Ambulatory Visit (HOSPITAL_BASED_OUTPATIENT_CLINIC_OR_DEPARTMENT_OTHER): Payer: PPO

## 2015-09-21 ENCOUNTER — Encounter: Payer: Self-pay | Admitting: Oncology

## 2015-09-21 VITALS — BP 174/78 | HR 108 | Temp 98.6°F | Resp 17

## 2015-09-21 DIAGNOSIS — D649 Anemia, unspecified: Secondary | ICD-10-CM | POA: Diagnosis not present

## 2015-09-21 LAB — PSA (PARALLEL TESTING): PSA: 203 ng/mL — ABNORMAL HIGH (ref <=4.00)

## 2015-09-21 LAB — PREPARE RBC (CROSSMATCH)

## 2015-09-21 LAB — PSA: PROSTATE SPECIFIC AG, SERUM: 220.2 ng/mL — AB (ref 0.0–4.0)

## 2015-09-21 MED ORDER — SODIUM CHLORIDE 0.9 % IV SOLN: 250.0000 mL | Freq: Once | INTRAVENOUS | Status: DC

## 2015-09-21 MED ORDER — SODIUM CHLORIDE 0.9% FLUSH
10.0000 mL | INTRAVENOUS | Status: AC | PRN
  Administered 2015-09-21: 10 mL
  Filled 2015-09-21: qty 10

## 2015-09-21 MED ORDER — DIPHENHYDRAMINE HCL 25 MG PO CAPS
25.0000 mg | ORAL_CAPSULE | Freq: Once | ORAL | Status: AC
  Administered 2015-09-21: 25 mg via ORAL

## 2015-09-21 MED ORDER — HEPARIN SOD (PORK) LOCK FLUSH 100 UNIT/ML IV SOLN
500.0000 [IU] | Freq: Every day | INTRAVENOUS | Status: AC | PRN
  Administered 2015-09-21: 500 [IU]
  Filled 2015-09-21: qty 5

## 2015-09-21 MED ORDER — DIPHENHYDRAMINE HCL 25 MG PO CAPS
ORAL_CAPSULE | ORAL | Status: AC
  Filled 2015-09-21: qty 1

## 2015-09-21 MED ORDER — ACETAMINOPHEN 325 MG PO TABS
ORAL_TABLET | ORAL | Status: AC
  Filled 2015-09-21: qty 2

## 2015-09-21 MED ORDER — ACETAMINOPHEN 325 MG PO TABS
650.0000 mg | ORAL_TABLET | Freq: Once | ORAL | Status: AC
  Administered 2015-09-21: 650 mg via ORAL

## 2015-09-21 NOTE — Patient Instructions (Signed)

## 2015-09-21 NOTE — Progress Notes (Signed)
form left in box

## 2015-09-22 ENCOUNTER — Encounter: Payer: Self-pay | Admitting: Oncology

## 2015-09-22 ENCOUNTER — Ambulatory Visit (HOSPITAL_BASED_OUTPATIENT_CLINIC_OR_DEPARTMENT_OTHER): Payer: PPO

## 2015-09-22 VITALS — BP 154/63 | HR 115 | Temp 97.9°F

## 2015-09-22 DIAGNOSIS — C61 Malignant neoplasm of prostate: Secondary | ICD-10-CM

## 2015-09-22 DIAGNOSIS — Z5189 Encounter for other specified aftercare: Secondary | ICD-10-CM

## 2015-09-22 LAB — TYPE AND SCREEN
ABO/RH(D): A POS
ANTIBODY SCREEN: NEGATIVE
UNIT DIVISION: 0
UNIT DIVISION: 0

## 2015-09-22 MED ORDER — PEGFILGRASTIM INJECTION 6 MG/0.6ML ~~LOC~~
6.0000 mg | PREFILLED_SYRINGE | Freq: Once | SUBCUTANEOUS | Status: AC
  Administered 2015-09-22: 6 mg via SUBCUTANEOUS
  Filled 2015-09-22: qty 0.6

## 2015-09-22 NOTE — Progress Notes (Signed)
I placed form for dr. Alen Blew to sign.noted in sharepoint

## 2015-09-22 NOTE — Progress Notes (Signed)
I faxed form to  [phone] and mailed copy to patient for their records. sharepoint noted and sent to medical records.

## 2015-09-27 ENCOUNTER — Encounter: Payer: Self-pay | Admitting: Oncology

## 2015-09-27 NOTE — Progress Notes (Signed)
I refaxed form to [facility]--all sections were filled in. Note indicated were left off-section 3??

## 2015-10-04 ENCOUNTER — Other Ambulatory Visit: Payer: Self-pay | Admitting: *Deleted

## 2015-10-04 ENCOUNTER — Encounter: Payer: Self-pay | Admitting: *Deleted

## 2015-10-04 ENCOUNTER — Telehealth: Payer: Self-pay | Admitting: *Deleted

## 2015-10-04 MED ORDER — OXYCODONE HCL 5 MG PO TABS: 5.0000 mg | ORAL_TABLET | ORAL | Status: DC | PRN

## 2015-10-04 NOTE — Telephone Encounter (Signed)
Patient requesting refill of oxycodone.

## 2015-10-05 ENCOUNTER — Encounter: Payer: Self-pay | Admitting: *Deleted

## 2015-10-05 ENCOUNTER — Telehealth: Payer: Self-pay | Admitting: *Deleted

## 2015-10-05 NOTE — Telephone Encounter (Signed)
Patient calling for refill on oxycodone and ensure samples. per dr Alvy Bimler script for oxycodone left at front for p/u and bag of ensure and boost , along with coupons for cents off supplied by [employer]ff  Nutritionist. Patient notified.

## 2015-10-05 NOTE — Telephone Encounter (Signed)
No note

## 2015-10-06 ENCOUNTER — Encounter: Payer: Self-pay | Admitting: Oncology

## 2015-10-06 NOTE — Progress Notes (Signed)
Per Mel Almond she is sending form for prior auth for megestsrol Acetate.

## 2015-10-08 ENCOUNTER — Encounter (HOSPITAL_COMMUNITY): Payer: Self-pay | Admitting: Emergency Medicine

## 2015-10-08 ENCOUNTER — Emergency Department (HOSPITAL_COMMUNITY)
Admission: EM | Admit: 2015-10-08 | Discharge: 2015-10-08 | Disposition: A | Discharge status: Home / Self Care | Payer: PPO | Attending: Emergency Medicine | Admitting: Emergency Medicine

## 2015-10-08 ENCOUNTER — Emergency Department (HOSPITAL_COMMUNITY): Payer: PPO

## 2015-10-08 DIAGNOSIS — Z79899 Other long term (current) drug therapy: Secondary | ICD-10-CM | POA: Insufficient documentation

## 2015-10-08 DIAGNOSIS — C61 Malignant neoplasm of prostate: Secondary | ICD-10-CM | POA: Diagnosis not present

## 2015-10-08 DIAGNOSIS — I10 Essential (primary) hypertension: Secondary | ICD-10-CM | POA: Insufficient documentation

## 2015-10-08 DIAGNOSIS — M545 Low back pain, unspecified: Secondary | ICD-10-CM

## 2015-10-08 DIAGNOSIS — Z7902 Long term (current) use of antithrombotics/antiplatelets: Secondary | ICD-10-CM | POA: Insufficient documentation

## 2015-10-08 DIAGNOSIS — S32000A Wedge compression fracture of unspecified lumbar vertebra, initial encounter for closed fracture: Secondary | ICD-10-CM

## 2015-10-08 DIAGNOSIS — M8468XA Pathological fracture in other disease, other site, initial encounter for fracture: Secondary | ICD-10-CM | POA: Diagnosis not present

## 2015-10-08 DIAGNOSIS — E119 Type 2 diabetes mellitus without complications: Secondary | ICD-10-CM | POA: Diagnosis not present

## 2015-10-08 DIAGNOSIS — Z8673 Personal history of transient ischemic attack (TIA), and cerebral infarction without residual deficits: Secondary | ICD-10-CM | POA: Insufficient documentation

## 2015-10-08 DIAGNOSIS — Z7982 Long term (current) use of aspirin: Secondary | ICD-10-CM | POA: Insufficient documentation

## 2015-10-08 DIAGNOSIS — R109 Unspecified abdominal pain: Secondary | ICD-10-CM | POA: Diagnosis present

## 2015-10-08 DIAGNOSIS — Z85830 Personal history of malignant neoplasm of bone: Secondary | ICD-10-CM | POA: Insufficient documentation

## 2015-10-08 DIAGNOSIS — Z794 Long term (current) use of insulin: Secondary | ICD-10-CM | POA: Insufficient documentation

## 2015-10-08 DIAGNOSIS — Z7984 Long term (current) use of oral hypoglycemic drugs: Secondary | ICD-10-CM | POA: Diagnosis not present

## 2015-10-08 DIAGNOSIS — R1033 Periumbilical pain: Secondary | ICD-10-CM | POA: Diagnosis not present

## 2015-10-08 DIAGNOSIS — Z7952 Long term (current) use of systemic steroids: Secondary | ICD-10-CM | POA: Insufficient documentation

## 2015-10-08 LAB — COMPREHENSIVE METABOLIC PANEL
ALK PHOS: 576 U/L — AB (ref 38–126)
ALT: 18 U/L (ref 17–63)
AST: 54 U/L — AB (ref 15–41)
Albumin: 3.9 g/dL (ref 3.5–5.0)
Anion gap: 10 (ref 5–15)
BILIRUBIN TOTAL: 0.8 mg/dL (ref 0.3–1.2)
BUN: 13 mg/dL (ref 6–20)
CO2: 26 mmol/L (ref 22–32)
Calcium: 9.7 mg/dL (ref 8.9–10.3)
Chloride: 104 mmol/L (ref 101–111)
Creatinine, Ser: 0.37 mg/dL — ABNORMAL LOW (ref 0.61–1.24)
GFR calc Af Amer: >60 mL/min (ref >60)
GLUCOSE: 238 mg/dL — AB (ref 65–99)
POTASSIUM: 4.1 mmol/L (ref 3.5–5.1)
Sodium: 140 mmol/L (ref 135–145)
TOTAL PROTEIN: 6.4 g/dL — AB (ref 6.5–8.1)

## 2015-10-08 LAB — CBC WITH DIFFERENTIAL/PLATELET
BASOS ABS: 0 10*3/uL (ref 0.0–0.1)
Basophils Relative: 0 %
EOS ABS: 0 10*3/uL (ref 0.0–0.7)
Eosinophils Relative: 0 %
HCT: 22.1 % — ABNORMAL LOW (ref 39.0–52.0)
HEMOGLOBIN: 7.2 g/dL — AB (ref 13.0–17.0)
LYMPHS PCT: 7 %
Lymphs Abs: 0.8 10*3/uL (ref 0.7–4.0)
MCH: 29.1 pg (ref 26.0–34.0)
MCHC: 32.6 g/dL (ref 30.0–36.0)
MCV: 89.5 fL (ref 78.0–100.0)
MONOS PCT: 5 %
Monocytes Absolute: 0.6 10*3/uL (ref 0.1–1.0)
NEUTROS ABS: 10.3 10*3/uL — AB (ref 1.7–7.7)
NEUTROS PCT: 88 %
PLATELETS: 91 10*3/uL — AB (ref 150–400)
RBC: 2.47 MIL/uL — AB (ref 4.22–5.81)
RDW: 16.1 % — ABNORMAL HIGH (ref 11.5–15.5)
WBC: 11.7 10*3/uL — ABNORMAL HIGH (ref 4.0–10.5)

## 2015-10-08 LAB — LIPASE, BLOOD: Lipase: 18 U/L (ref 11–51)

## 2015-10-08 MED ORDER — IOHEXOL 300 MG/ML  SOLN
50.0000 mL | Freq: Once | INTRAMUSCULAR | Status: AC | PRN
  Administered 2015-10-08: 50 mL via ORAL

## 2015-10-08 MED ORDER — SODIUM CHLORIDE 0.9 % IV BOLUS (SEPSIS)
250.0000 mL | Freq: Once | INTRAVENOUS | Status: AC
  Administered 2015-10-08: 250 mL via INTRAVENOUS

## 2015-10-08 MED ORDER — SODIUM CHLORIDE 0.9 % IV SOLN
INTRAVENOUS | Status: DC
  Administered 2015-10-08: 800 mL via INTRAVENOUS

## 2015-10-08 MED ORDER — IOHEXOL 300 MG/ML  SOLN
100.0000 mL | Freq: Once | INTRAMUSCULAR | Status: AC | PRN
  Administered 2015-10-08: 100 mL via INTRAVENOUS

## 2015-10-08 MED ORDER — OXYCODONE HCL 5 MG PO TABS: 5.0000 mg | ORAL_TABLET | ORAL | Status: DC | PRN

## 2015-10-08 MED ORDER — ONDANSETRON HCL 4 MG/2ML IJ SOLN
4.0000 mg | Freq: Once | INTRAMUSCULAR | Status: AC
  Administered 2015-10-08: 4 mg via INTRAVENOUS
  Filled 2015-10-08: qty 2

## 2015-10-08 MED ORDER — HEPARIN SOD (PORK) LOCK FLUSH 100 UNIT/ML IV SOLN
500.0000 [IU] | Freq: Once | INTRAVENOUS | Status: AC
  Administered 2015-10-08: 500 [IU]
  Filled 2015-10-08: qty 5

## 2015-10-08 NOTE — ED Notes (Signed)
Pt c/o abd pain naval to R side x 1 week, denies n/v/d. CA pt, last chemo 3 weeks ago, next tx Wednesday. Numbing cream applied to port

## 2015-10-08 NOTE — Discharge Instructions (Signed)
Follow-up with hematology oncology doctors as directed. It appears that your pre-existing L4 compression fracture is showing evidence of some worsening. Otherwise workup here today without any acute findings. Labs without significant changes. Take pain medicine as directed.

## 2015-10-08 NOTE — ED Provider Notes (Signed)
CSN: [id number]     Arrival date & time 10/08/15  0603 History   First MD Initiated Contact with Patient 10/08/15 [phone]     Chief Complaint  Patient presents with  . Abdominal Pain     (Consider location/radiation/quality/duration/timing/severity/associated sxs/prior Treatment) Patient is a 70 y.o. male presenting with abdominal pain. The history is provided by the patient.  Abdominal Pain Associated symptoms: no chest pain, no diarrhea, no dysuria, no fever, no nausea, no shortness of breath and no vomiting    patient with history of metastatic prostate cancer. Currently undergoing chemotherapy. Has 3 more months. Previously had radiation treatment. Patient with complaint of one-week history of bandlike pain from the umbilicus around to the back. Patient denies any nausea or vomiting fevers or dysuria.  Past Medical History  Diagnosis Date  . Stroke ([place])   . Diabetes mellitus   . Hypertension   . Prostate cancer ([place])   . Bone metastases ([place])   . Prostate cancer [facility])    Past Surgical History  Procedure Laterality Date  . Eye surgery     Family History  Problem Relation Age of Onset  . Cancer Mother   . Hypertension Mother   . Hypertension Father   . Cancer Father   . Diabetes Sister   . Hypertension Sister   . Hypertension Brother   . Diabetes Brother    Social History  Substance Use Topics  . Smoking status: Never Smoker   . Smokeless tobacco: Never Used  . Alcohol Use: No    Review of Systems  Constitutional: Negative for fever.  HENT: Negative for congestion.   Eyes: Negative for visual disturbance.  Respiratory: Negative for shortness of breath.   Cardiovascular: Negative for chest pain.  Gastrointestinal: Positive for abdominal pain. Negative for nausea, vomiting and diarrhea.  Genitourinary: Negative for dysuria.  Musculoskeletal: Positive for back pain.  Skin: Negative for rash.  Neurological: Negative for headaches.  Hematological: Does not  bruise/bleed easily.  Psychiatric/Behavioral: Negative for confusion.      Allergies  Review of patient's allergies indicates no known allergies.  Home Medications   Prior to Admission medications   Medication Sig Start Date End Date Taking? Authorizing Provider  aspirin 81 MG chewable tablet Chew 1 tablet (81 mg total) by mouth daily. 06/14/15  Yes Verlee Monte, MD  atorvastatin (LIPITOR) 40 MG tablet Take 1 tablet (40 mg total) by mouth at bedtime. 06/14/15  Yes Verlee Monte, MD  bisacodyl (DULCOLAX) 10 MG suppository Place 1 suppository (10 mg total) rectally daily. 07/19/15  Yes Theodis Blaze, MD  cholecalciferol (VITAMIN D) 400 UNITS TABS tablet Take 400 Units by mouth daily.   Yes Historical Provider, MD  clopidogrel (PLAVIX) 75 MG tablet Take 1 tablet (75 mg total) by mouth daily with breakfast. 06/14/15  Yes Mutaz Elmahi, MD  COMBIGAN 0.2-0.5 % ophthalmic solution Place 1 drop into both eyes every 12 (twelve) hours.  10/04/15  Yes Historical Provider, MD  dexamethasone (DECADRON) 4 MG tablet Take 1 tablet (4 mg total) by mouth 3 (three) times daily. 07/19/15  Yes Theodis Blaze, MD  dorzolamide (TRUSOPT) 2 % ophthalmic solution Place into both eyes 2 (two) times daily.  10/06/15  Yes Historical Provider, MD  feeding supplement, ENSURE ENLIVE, (ENSURE ENLIVE) LIQD Take 237 mLs by mouth 2 (two) times daily between meals. 08/06/15  Yes Barton Dubois, MD  furosemide (LASIX) 20 MG tablet Take 1 tablet (20 mg total) by mouth daily. 08/23/15  Yes Jeneen Rinks  Kinard, MD  glipiZIDE (GLUCOTROL) 10 MG tablet Take 1 tablet (10 mg total) by mouth daily before breakfast. 06/14/15  Yes Verlee Monte, MD  insulin aspart (NOVOLOG) 100 UNIT/ML injection Inject 0-20 Units into the skin 3 (three) times daily with meals. Patient taking differently: Inject 10-25 Units into the skin 3 (three) times daily with meals.  07/19/15  Yes Theodis Blaze, MD  Insulin Detemir (LEVEMIR) 100 UNIT/ML Pen Inject 10 Units into the  skin daily. Patient taking differently: Inject 15 Units into the skin daily.  08/06/15  Yes Barton Dubois, MD  iron polysaccharides (NIFEREX) 150 MG capsule Take 1 capsule (150 mg total) by mouth daily. 08/06/15  Yes Barton Dubois, MD  lidocaine-prilocaine (EMLA) cream Apply 1 application topically as needed. 05/23/15  Yes Wyatt Portela, MD  losartan (COZAAR) 50 MG tablet Take 1 tablet (50 mg total) by mouth daily. 06/14/15  Yes Mutaz Elmahi, MD  LUMIGAN 0.01 % SOLN Place 1 drop into both eyes at bedtime.  10/04/15  Yes Historical Provider, MD  metFORMIN (GLUCOPHAGE) 1000 MG tablet Take 1 tablet (1,000 mg total) by mouth 2 (two) times daily. 06/14/15  Yes Verlee Monte, MD  oxyCODONE (OXYCONTIN) 15 mg 12 hr tablet Take 1 tablet (15 mg total) by mouth every 12 (twelve) hours. 09/05/15  Yes Gery Pray, MD  prochlorperazine (COMPAZINE) 10 MG tablet Take 1 tablet (10 mg total) by mouth every 6 (six) hours as needed for nausea or vomiting. 06/28/15  Yes Wyatt Portela, MD  senna-docusate (SENOKOT-S) 8.6-50 MG tablet Take 1 tablet by mouth 2 (two) times daily. 07/16/15  Yes Theodis Blaze, MD  traZODone (DESYREL) 50 MG tablet Take 0.5 tablets (25 mg total) by mouth at bedtime as needed for sleep. 08/30/15  Yes Wyatt Portela, MD  megestrol (MEGACE) 400 MG/10ML suspension Take 10 mLs (400 mg total) by mouth 2 (two) times daily. Patient not taking: Reported on 10/08/2015 09/20/15   Wyatt Portela, MD  oxyCODONE (OXY IR/ROXICODONE) 5 MG immediate release tablet Take 1 tablet (5 mg total) by mouth every 4 (four) hours as needed for moderate pain. 10/08/15   Fredia Sorrow, MD   BP 176/84 mmHg  Pulse 98  Temp(Src) 97.8 F (36.6 C) (Oral)  Resp 18  Ht 5\' 6"  (1.676 m)  Wt 58.968 kg  BMI 20.99 kg/m2  SpO2 98% Physical Exam  Constitutional: He is oriented to person, place, and time. He appears well-developed and well-nourished. No distress.  HENT:  Head: Normocephalic and atraumatic.  Mouth/Throat: Oropharynx  is clear and moist.  Eyes: Conjunctivae and EOM are normal. Pupils are equal, round, and reactive to light.  Neck: Normal range of motion. Neck supple.  Cardiovascular: Normal rate, regular rhythm and normal heart sounds.   No murmur heard. Pulmonary/Chest: Effort normal and breath sounds normal. No respiratory distress.  Abdominal: Soft. Bowel sounds are normal. He exhibits no distension. There is no tenderness.  Musculoskeletal: Normal range of motion. He exhibits tenderness.  Tenderness to palpation lower lumbar area.  Neurological: He is alert and oriented to person, place, and time. No cranial nerve deficit. He exhibits normal muscle tone. Coordination normal.  Skin: Skin is warm.  Nursing note and vitals reviewed.   ED Course  Procedures (including critical care time) Labs Review Labs Reviewed  COMPREHENSIVE METABOLIC PANEL - Abnormal; Notable for the following:    Glucose, Bld 238 (*)    Creatinine, Ser 0.37 (*)    Total Protein 6.4 (*)  AST 54 (*)    Alkaline Phosphatase 576 (*)    All other components within normal limits  CBC WITH DIFFERENTIAL/PLATELET - Abnormal; Notable for the following:    WBC 11.7 (*)    RBC 2.47 (*)    Hemoglobin 7.2 (*)    HCT 22.1 (*)    RDW 16.1 (*)    Platelets 91 (*)    Neutro Abs 10.3 (*)    All other components within normal limits  LIPASE, BLOOD   Results for orders placed or performed during the hospital encounter of 10/08/15  Lipase, blood  Result Value Ref Range   Lipase 18 11 - 51 U/L  Comprehensive metabolic panel  Result Value Ref Range   Sodium 140 135 - 145 mmol/L   Potassium 4.1 3.5 - 5.1 mmol/L   Chloride 104 101 - 111 mmol/L   CO2 26 22 - 32 mmol/L   Glucose, Bld 238 (H) 65 - 99 mg/dL   BUN 13 6 - 20 mg/dL   Creatinine, Ser 0.37 (L) 0.61 - 1.24 mg/dL   Calcium 9.7 8.9 - 10.3 mg/dL   Total Protein 6.4 (L) 6.5 - 8.1 g/dL   Albumin 3.9 3.5 - 5.0 g/dL   AST 54 (H) 15 - 41 U/L   ALT 18 17 - 63 U/L   Alkaline  Phosphatase 576 (H) 38 - 126 U/L   Total Bilirubin 0.8 0.3 - 1.2 mg/dL   GFR calc non Af Amer >60 >60 mL/min   GFR calc Af Amer >60 >60 mL/min   Anion gap 10 5 - 15  CBC with Differential/Platelet  Result Value Ref Range   WBC 11.7 (H) 4.0 - 10.5 K/uL   RBC 2.47 (L) 4.22 - 5.81 MIL/uL   Hemoglobin 7.2 (L) 13.0 - 17.0 g/dL   HCT 22.1 (L) 39.0 - 52.0 %   MCV 89.5 78.0 - 100.0 fL   MCH 29.1 26.0 - 34.0 pg   MCHC 32.6 30.0 - 36.0 g/dL   RDW 16.1 (H) 11.5 - 15.5 %   Platelets 91 (L) 150 - 400 K/uL   Neutrophils Relative % 88 %   Lymphocytes Relative 7 %   Monocytes Relative 5 %   Eosinophils Relative 0 %   Basophils Relative 0 %   Neutro Abs 10.3 (H) 1.7 - 7.7 K/uL   Lymphs Abs 0.8 0.7 - 4.0 K/uL   Monocytes Absolute 0.6 0.1 - 1.0 K/uL   Eosinophils Absolute 0.0 0.0 - 0.7 K/uL   Basophils Absolute 0.0 0.0 - 0.1 K/uL   RBC Morphology POLYCHROMASIA PRESENT    Smear Review LARGE PLATELETS PRESENT      Imaging Review Ct Abdomen Pelvis W Contrast  10/08/2015  CLINICAL DATA:  Pt c/o abd pain naval to R side x 1 week, denies n/v/d. CA pt, last chemo 3 weeks ago, next tx Wednesday. Hx of diabetes, HTN, stroke and prostate ca with bone mets EXAM: CT ABDOMEN AND PELVIS WITH CONTRAST TECHNIQUE: Multidetector CT imaging of the abdomen and pelvis was performed using the standard protocol following bolus administration of intravenous contrast. CONTRAST:  166mL OMNIPAQUE IOHEXOL 300 MG/ML SOLN, 28mL OMNIPAQUE IOHEXOL 300 MG/ML SOLN COMPARISON:  04/10/2015 FINDINGS: Lung bases:  Clear.  Heart normal size. Liver, spleen, gallbladder, pancreas, adrenal glands:  Normal. Kidneys, ureters, bladder: Mild prominence of the intrarenal collecting systems and ureters. No renal or ureteral stones. Symmetric renal enhancement and excretion. Mild wall thickening of the bladder. Prostate gland: Enlarged and heterogeneous. Prostate bulges  against the bladder base but there is no evidence of bladder invasion. Lymph  nodes:  No adenopathy. Ascites:  None. Vascular: Atherosclerotic calcifications along the abdominal aorta and iliac arteries. Gastrointestinal:  Unremarkable.  Normal appendix visualized. Musculoskeletal: No significant increase in bony metastatic disease throughout the axial skeleton. The pathologic compression fracture of L4 has increased in severity. IMPRESSION: 1. No acute findings within the abdomen pelvis. 2. Prostatic enlargement, similar to the prior exam. Bladder wall thickening is new, mild in severity. No evidence of prostate carcinoma invading the bladder. The mild prominence of the intrarenal collecting systems and ureters bilaterally is similar to the prior study. 3. There has been significant worsening of metastatic disease to bone. A pathologic compression fracture of L4 has increased in severity. Electronically Signed   By: Lajean Manes M.D.   On: 10/08/2015 09:36   I have personally reviewed and evaluated these images and lab results as part of my medical decision-making.   EKG Interpretation None      MDM   Final diagnoses:  Midline low back pain without sciatica  Periumbilical abdominal pain  Lumbar compression fracture, closed, initial encounter [facility])    Patient presenting with complaint of bandlike periumbilical abdominal pain that radiates around to the low part of his back. Started a week ago. No nausea no vomiting. Patient is undergoing chemotherapy for prostate cancer. Has had radiation treatment for prostate cancer in the past.  Workup shows no significant changes in his labs. Does have a baseline anemia. CT scan shows no acute changes other than the L4 compression fracture which was pre-existing as showing evidence of worsening collapse. This may very well explain the patient's pain. Patient is currently out of some of his pain medicine.    Fredia Sorrow, MD 10/08/15 1050

## 2015-10-08 NOTE — ED Notes (Signed)
RN to access blood w/ port access

## 2015-10-09 ENCOUNTER — Encounter: Payer: Self-pay | Admitting: Oncology

## 2015-10-09 NOTE — Progress Notes (Signed)
I faxed [facility] again with notes/labs for megestrol denial

## 2015-10-10 ENCOUNTER — Encounter: Payer: Self-pay | Admitting: Oncology

## 2015-10-10 NOTE — Progress Notes (Signed)
Left message at [facility] [phone] ref# for appeal for denial of megestrol

## 2015-10-11 ENCOUNTER — Ambulatory Visit: Payer: PPO

## 2015-10-11 ENCOUNTER — Ambulatory Visit (HOSPITAL_BASED_OUTPATIENT_CLINIC_OR_DEPARTMENT_OTHER): Payer: PPO

## 2015-10-11 ENCOUNTER — Ambulatory Visit (HOSPITAL_BASED_OUTPATIENT_CLINIC_OR_DEPARTMENT_OTHER): Payer: PPO | Admitting: Oncology

## 2015-10-11 ENCOUNTER — Other Ambulatory Visit (HOSPITAL_BASED_OUTPATIENT_CLINIC_OR_DEPARTMENT_OTHER): Payer: PPO

## 2015-10-11 ENCOUNTER — Telehealth: Payer: Self-pay | Admitting: *Deleted

## 2015-10-11 VITALS — BP 166/68 | HR 108 | Temp 97.5°F | Resp 16 | Ht 66.0 in | Wt 120.2 lb

## 2015-10-11 DIAGNOSIS — Z5111 Encounter for antineoplastic chemotherapy: Secondary | ICD-10-CM

## 2015-10-11 DIAGNOSIS — C61 Malignant neoplasm of prostate: Secondary | ICD-10-CM | POA: Diagnosis not present

## 2015-10-11 DIAGNOSIS — C7951 Secondary malignant neoplasm of bone: Secondary | ICD-10-CM

## 2015-10-11 DIAGNOSIS — Z95828 Presence of other vascular implants and grafts: Secondary | ICD-10-CM

## 2015-10-11 DIAGNOSIS — D649 Anemia, unspecified: Secondary | ICD-10-CM

## 2015-10-11 DIAGNOSIS — E291 Testicular hypofunction: Secondary | ICD-10-CM | POA: Diagnosis not present

## 2015-10-11 LAB — CBC WITH DIFFERENTIAL/PLATELET
BASO%: 0.2 % (ref 0.0–2.0)
Basophils Absolute: 0 10*3/uL (ref 0.0–0.1)
EOS%: 0.1 % (ref 0.0–7.0)
Eosinophils Absolute: 0 10*3/uL (ref 0.0–0.5)
HCT: 23.5 % — ABNORMAL LOW (ref 38.4–49.9)
HGB: 7.5 g/dL — ABNORMAL LOW (ref 13.0–17.1)
LYMPH%: 6.2 % — ABNORMAL LOW (ref 14.0–49.0)
MCH: 27.9 pg (ref 27.2–33.4)
MCHC: 32 g/dL (ref 32.0–36.0)
MCV: 87.3 fL (ref 79.3–98.0)
MONO#: 0.4 10*3/uL (ref 0.1–0.9)
MONO%: 5.3 % (ref 0.0–14.0)
NEUT#: 7.3 10*3/uL — ABNORMAL HIGH (ref 1.5–6.5)
NEUT%: 88.2 % — ABNORMAL HIGH (ref 39.0–75.0)
Platelets: 73 10*3/uL — ABNORMAL LOW (ref 140–400)
RBC: 2.69 10*6/uL — ABNORMAL LOW (ref 4.20–5.82)
RDW: 16.9 % — ABNORMAL HIGH (ref 11.0–14.6)
WBC: 8.3 10*3/uL (ref 4.0–10.3)
lymph#: 0.5 10*3/uL — ABNORMAL LOW (ref 0.9–3.3)

## 2015-10-11 LAB — COMPREHENSIVE METABOLIC PANEL WITH GFR
ALT: 21 U/L (ref 0–55)
AST: 45 U/L — ABNORMAL HIGH (ref 5–34)
Albumin: 3.7 g/dL (ref 3.5–5.0)
Alkaline Phosphatase: 756 U/L — ABNORMAL HIGH (ref 40–150)
Anion Gap: 10 meq/L (ref 3–11)
BUN: 10.5 mg/dL (ref 7.0–26.0)
CO2: 28 meq/L (ref 22–29)
Calcium: 9.7 mg/dL (ref 8.4–10.4)
Chloride: 100 meq/L (ref 98–109)
Creatinine: 0.7 mg/dL (ref 0.7–1.3)
EGFR: >90 mL/min/{1.73_m2}
Glucose: 280 mg/dL — ABNORMAL HIGH (ref 70–140)
Potassium: 4.2 meq/L (ref 3.5–5.1)
Sodium: 138 meq/L (ref 136–145)
Total Bilirubin: 0.64 mg/dL (ref 0.20–1.20)
Total Protein: 6.5 g/dL (ref 6.4–8.3)

## 2015-10-11 MED ORDER — FENTANYL 25 MCG/HR TD PT72: 25.0000 ug | MEDICATED_PATCH | TRANSDERMAL | Status: AC

## 2015-10-11 MED ORDER — DOCETAXEL CHEMO INJECTION 160 MG/16ML
60.0000 mg/m2 | Freq: Once | INTRAVENOUS | Status: DC
  Filled 2015-10-11: qty 9

## 2015-10-11 MED ORDER — SODIUM CHLORIDE 0.9 % IV SOLN
60.0000 mg/m2 | Freq: Once | INTRAVENOUS | Status: AC
  Administered 2015-10-11: 90 mg via INTRAVENOUS
  Filled 2015-10-11: qty 9

## 2015-10-11 MED ORDER — SODIUM CHLORIDE 0.9 % IV SOLN
Freq: Once | INTRAVENOUS | Status: AC
  Administered 2015-10-11: 12:00:00 via INTRAVENOUS
  Filled 2015-10-11: qty 4

## 2015-10-11 MED ORDER — LIDOCAINE-PRILOCAINE 2.5-2.5 % EX CREA
TOPICAL_CREAM | CUTANEOUS | Status: AC
  Filled 2015-10-11: qty 5

## 2015-10-11 MED ORDER — HEPARIN SOD (PORK) LOCK FLUSH 100 UNIT/ML IV SOLN
500.0000 [IU] | Freq: Once | INTRAVENOUS | Status: AC | PRN
  Administered 2015-10-11: 500 [IU]
  Filled 2015-10-11: qty 5

## 2015-10-11 MED ORDER — SODIUM CHLORIDE 0.9% FLUSH
10.0000 mL | INTRAVENOUS | Status: DC | PRN
  Administered 2015-10-11: 10 mL via INTRAVENOUS
  Filled 2015-10-11: qty 10

## 2015-10-11 MED ORDER — SODIUM CHLORIDE 0.9 % IJ SOLN
10.0000 mL | INTRAMUSCULAR | Status: DC | PRN
  Administered 2015-10-11: 10 mL
  Filled 2015-10-11: qty 10

## 2015-10-11 MED ORDER — SODIUM CHLORIDE 0.9 % IV SOLN
Freq: Once | INTRAVENOUS | Status: AC
  Administered 2015-10-11: 11:00:00 via INTRAVENOUS

## 2015-10-11 NOTE — Progress Notes (Signed)
Per Dr. Shadad, it's OK to treat today. 

## 2015-10-11 NOTE — Telephone Encounter (Signed)
Per staff message and POF I have scheduled appts. Advised scheduler of appts. JMW  

## 2015-10-11 NOTE — Progress Notes (Signed)
Hematology and Oncology Follow Up Visit  Ricky Terry [id number] [birth year] 70 y.o. 10/11/2015 10:28 AM Ricky Terry, MDOsei-Bonsu, [name], MD   Principle Diagnosis: 70 year old gentleman with castration resistant metastatic prostate cancer with disease to the bone. His initial diagnosis was in April 2015 with a PSA of 368 and a Gleason score of 4+5 = 9. He has metastatic disease to the bone at the time of diagnosis..   Prior Therapy: He was treated with hormone therapy and his PSA initially dropped to 34 in September 2015. His PSA was up to 72 and February 2016 and Casodex was added. His most recent PSA was up to 77.9 on 02/28/2015 and 206 in October 2016. His repeat bone scan showed marked progression of his bony metastasis compared to his initial imaging studies.  He is status post palliative radiation therapy for a total of 30 gray in 10 fractions to the right proximal femur and the left video pelvic region. This concluded in 09/12/2015.  Current therapy: Taxotere chemotherapy at 75 mg per metered square every 3 weeks received cycle 1 on 06/07/2015. He is here to receive cycle 6 the dose was reduced to 60 mg/m for better tolerance.  Interim History: Ricky Terry presents today for a follow-up visit. Since the last visit, he reports some improvement in his overall health. He is eating better and have gained 8 pounds. His quality of life have been reasonably improved. He still having issues with pain however. He does not appear to be taken OxyContin and does take oxycodone as needed and he has been needing it very frequently as of late. He was seen in the emergency department previously for increased pain and was not hospitalized. He reports that he takes his pain medication and less than 4 hours later he is awake and by pain. His pain is rather diffuse and not associated with any neurological symptoms.   His last chemotherapy cycle was tolerated without any complications. He denied any  nausea, vomiting or peripheral neuropathy. He did not any infusion-related issues. He still ambulating without any difficulties or falls or syncope.  He does have some fatigue but no dyspnea on exertion. Has not reported any chest pain or palpitation.  He does not report any headaches, blurry vision, syncope or seizures. He does not report any fevers, chills or sweats. He does not report any orthopnea. Does not report any leg edema, cough, hemoptysis or wheezing. Does not report any nausea, vomiting, abdominal pain, hematochezia or melena. He does not report any frequency, urgency or hesitancy. Does not report any lymphadenopathy or petechiae. Remaining review of systems unremarkable  Medications: I have reviewed the patient's current medications.  No major changes by my review. Current Outpatient Prescriptions  Medication Sig Dispense Refill  . aspirin 81 MG chewable tablet Chew 1 tablet (81 mg total) by mouth daily. 30 tablet 0  . atorvastatin (LIPITOR) 40 MG tablet Take 1 tablet (40 mg total) by mouth at bedtime. 30 tablet 0  . bisacodyl (DULCOLAX) 10 MG suppository Place 1 suppository (10 mg total) rectally daily. 12 suppository 0  . cholecalciferol (VITAMIN D) 400 UNITS TABS tablet Take 400 Units by mouth daily.    . clopidogrel (PLAVIX) 75 MG tablet Take 1 tablet (75 mg total) by mouth daily with breakfast. 30 tablet 1  . COMBIGAN 0.2-0.5 % ophthalmic solution Place 1 drop into both eyes every 12 (twelve) hours.     Marland Kitchen dexamethasone (DECADRON) 4 MG tablet Take 1 tablet (4 mg total) by mouth  3 (three) times daily. 21 tablet 1  . dorzolamide (TRUSOPT) 2 % ophthalmic solution Place into both eyes 2 (two) times daily.     . feeding supplement, ENSURE ENLIVE, (ENSURE ENLIVE) LIQD Take 237 mLs by mouth 2 (two) times daily between meals. 237 mL 12  . furosemide (LASIX) 20 MG tablet Take 1 tablet (20 mg total) by mouth daily. 3 tablet 0  . glipiZIDE (GLUCOTROL) 10 MG tablet Take 1 tablet (10 mg  total) by mouth daily before breakfast. 30 tablet 0  . insulin aspart (NOVOLOG) 100 UNIT/ML injection Inject 0-20 Units into the skin 3 (three) times daily with meals. (Patient taking differently: Inject 10-25 Units into the skin 3 (three) times daily with meals. ) 10 mL 11  . Insulin Detemir (LEVEMIR) 100 UNIT/ML Pen Inject 10 Units into the skin daily. (Patient taking differently: Inject 15 Units into the skin daily. ) 15 mL 5  . iron polysaccharides (NIFEREX) 150 MG capsule Take 1 capsule (150 mg total) by mouth daily. 30 capsule 2  . lidocaine-prilocaine (EMLA) cream Apply 1 application topically as needed. 30 g 0  . losartan (COZAAR) 50 MG tablet Take 1 tablet (50 mg total) by mouth daily. 30 tablet 0  . LUMIGAN 0.01 % SOLN Place 1 drop into both eyes at bedtime.     . megestrol (MEGACE) 400 MG/10ML suspension Take 10 mLs (400 mg total) by mouth 2 (two) times daily. 240 mL 0  . metFORMIN (GLUCOPHAGE) 1000 MG tablet Take 1 tablet (1,000 mg total) by mouth 2 (two) times daily. 30 tablet 0  . oxyCODONE (OXY IR/ROXICODONE) 5 MG immediate release tablet Take 1 tablet (5 mg total) by mouth every 4 (four) hours as needed for moderate pain. 30 tablet 0  . prochlorperazine (COMPAZINE) 10 MG tablet Take 1 tablet (10 mg total) by mouth every 6 (six) hours as needed for nausea or vomiting. 30 tablet 1  . senna-docusate (SENOKOT-S) 8.6-50 MG tablet Take 1 tablet by mouth 2 (two) times daily. 60 tablet 0  . traZODone (DESYREL) 50 MG tablet Take 0.5 tablets (25 mg total) by mouth at bedtime as needed for sleep. 30 tablet 0  . fentaNYL (DURAGESIC) 25 MCG/HR patch Place 1 patch (25 mcg total) onto the skin every 3 (three) days. 10 patch 0   No current facility-administered medications for this visit.   Facility-Administered Medications Ordered in Other Visits  Medication Dose Route Frequency Provider Last Rate Last Dose  . sodium chloride flush (NS) 0.9 % injection 10 mL  10 mL Intravenous PRN Wyatt Portela,  MD   10 mL at 10/11/15 1018     Allergies: No Known Allergies   Physical Exam: Blood pressure 166/68, pulse 108, temperature 97.5 F (36.4 C), temperature source Oral, resp. rate 16, height 5\' 6"  (1.676 m), weight 120 lb 3.2 oz (54.522 kg), SpO2 100 %. ECOG: 1 General appearance: alert and cooperative without distress today.  Head: Normocephalic, without obvious abnormality no oral masses or lesions. Neck: no adenopathy Lymph nodes: Cervical, supraclavicular, and axillary nodes normal. Heart:regular rate and rhythm, S1, S2 normal, no murmur, click, rub or gallop Lung:chest clear, no wheezing, rales, normal symmetric air entry Abdomin: soft, non-tender, without masses or organomegaly no shifting dullness or ascites. EXT:no erythema, induration, or nodules   Lab Results: Lab Results  Component Value Date   WBC 11.7* 10/08/2015   HGB 7.2* 10/08/2015   HCT 22.1* 10/08/2015   MCV 89.5 10/08/2015   PLT 91* 10/08/2015  Chemistry      Component Value Date/Time   NA 140 10/08/2015 0751   NA 136 09/20/2015 0956   K 4.1 10/08/2015 0751   K 3.7 09/20/2015 0956   CL 104 10/08/2015 0751   CO2 26 10/08/2015 0751   CO2 25 09/20/2015 0956   BUN 13 10/08/2015 0751   BUN 9.0 09/20/2015 0956   CREATININE 0.37* 10/08/2015 0751   CREATININE 0.7 09/20/2015 0956      Component Value Date/Time   CALCIUM 9.7 10/08/2015 0751   CALCIUM 9.1 09/20/2015 0956   ALKPHOS 576* 10/08/2015 0751   ALKPHOS 581* 09/20/2015 0956   AST 54* 10/08/2015 0751   AST 30 09/20/2015 0956   ALT 18 10/08/2015 0751   ALT 10 09/20/2015 0956   BILITOT 0.8 10/08/2015 0751   BILITOT 0.45 09/20/2015 0956     Results for Ricky Terry, Ricky Terry (MRN [id number]) as of 10/11/2015 10:31  Ref. Range 08/09/2015 08:54 08/30/2015 09:53 09/20/2015 09:56  PSA Latest Ref Range: <=4.00 ng/mL 243.80 (H) 207.80 (H) 203.00 (H)       Impression and Plan:  70 year old gentleman with the following issues:  1. Castration resistant  metastatic prostate cancer with disease to the bone. His initial diagnosis was in April 2015 with a PSA of 368 and a Gleason score of 4+5 = 9. He was initially treated with Lupron initially and Casodex added in March 2016 after his PSA started to rise. His initial response in his PSA went down to 34 and now is up to 206. His staging workup including a bone scan obtained on 02/28/2015 showed progression of disease.  He is currently on Taxotere chemotherapy and received 3 cycles of therapy and PSA dropped from 243 down to 203. He is manifesting clinically from chemotherapy as evident of weight gain as well as potentially improvement in his quality of life.  The plan is to proceed with cycle 6 today if his laboratory data meets the parameters. His CBC on 10/08/2015 showed his hemoglobin around 7 and platelet count of 91. These will be repeated today.  2. IV access: Port-A-Cath in place without any complications. EMLA cream is available to the patient and instructions to use it.  3. Nausea prophylaxis: Compazine is available to to him and instructions how to use it.   4. Neutropenia prophylaxis: He will receive Neulasta after each injection. No major complications associated with this injection.  5. Bone health: He is on calcium and vitamin D. Delton See will be started once dental clearance has been obtained.  6. Weight loss: This is improved dramatically since the last visit and have gained 8 pounds.  7. Pain: He is currently taking oxycodone and appears inadequate and does need long acting pain medication. He has been prescribed OxyContin in the past although it appears that he is not taking it. I gave him prescription for fentanyl patch with instructions how to use it. He was started at 25 g every 72 hours. Complications associated with this medication were reviewed including somnolence, constipation and confusion. I also instructed him to use breakthrough pain medication as he is doing now.  8.  Prognosis: Treatment goal is palliative and he understands that there is for his cancer. This was reiterated today and would like to continue aggressive measures.  9. Anemia: His hemoglobin is eating from today and likely will require a transfusion at some point before the next treatment. Asymptomatic at this time and is reasonable to hold off for the time being.  10.  Hormone therapy: I have recommended indefinite androgen deprivation and we'll monitor his testosterone periodically and administer Lupron at that time.  11. Follow-up: Will be in 3 weeks for the next cycle of chemotherapy.  [facility], MD 2/22/201710:28 AM

## 2015-10-11 NOTE — Patient Instructions (Signed)
[facility] Discharge Instructions for Patients Receiving Chemotherapy  Today you received the following chemotherapy agents:  Taxotere.  To help prevent nausea and vomiting after your treatment, we encourage you to take your nausea medication as directed.   If you develop nausea and vomiting that is not controlled by your nausea medication, call the clinic.   BELOW ARE SYMPTOMS THAT SHOULD BE REPORTED IMMEDIATELY:  *FEVER GREATER THAN 100.5 F  *CHILLS WITH OR WITHOUT FEVER  NAUSEA AND VOMITING THAT IS NOT CONTROLLED WITH YOUR NAUSEA MEDICATION  *UNUSUAL SHORTNESS OF BREATH  *UNUSUAL BRUISING OR BLEEDING  TENDERNESS IN MOUTH AND THROAT WITH OR WITHOUT PRESENCE OF ULCERS  *URINARY PROBLEMS  *BOWEL PROBLEMS  UNUSUAL RASH Items with * indicate a potential emergency and should be followed up as soon as possible.  Feel free to call the clinic you have any questions or concerns. The clinic phone number is [phone].  Please show the CHEMO ALERT CARD at check-in to the Emergency Department and triage nurse.  

## 2015-10-11 NOTE — Patient Instructions (Signed)

## 2015-10-12 LAB — PSA: Prostate Specific Ag, Serum: 287 ng/mL — ABNORMAL HIGH (ref 0.0–4.0)

## 2015-10-13 ENCOUNTER — Encounter: Payer: Self-pay | Admitting: Oncology

## 2015-10-13 ENCOUNTER — Ambulatory Visit (HOSPITAL_BASED_OUTPATIENT_CLINIC_OR_DEPARTMENT_OTHER): Payer: PPO

## 2015-10-13 VITALS — BP 152/60 | HR 106 | Temp 97.8°F

## 2015-10-13 DIAGNOSIS — C61 Malignant neoplasm of prostate: Secondary | ICD-10-CM | POA: Diagnosis not present

## 2015-10-13 DIAGNOSIS — Z5189 Encounter for other specified aftercare: Secondary | ICD-10-CM

## 2015-10-13 MED ORDER — PEGFILGRASTIM INJECTION 6 MG/0.6ML ~~LOC~~
6.0000 mg | PREFILLED_SYRINGE | Freq: Once | SUBCUTANEOUS | Status: AC
  Administered 2015-10-13: 6 mg via SUBCUTANEOUS
  Filled 2015-10-13: qty 0.6

## 2015-10-13 NOTE — Progress Notes (Signed)
Per [facility] megestrol was approved 10/11/15-08/18/16. I sent to medical records

## 2015-10-14 ENCOUNTER — Encounter (HOSPITAL_COMMUNITY): Payer: Self-pay | Admitting: *Deleted

## 2015-10-14 ENCOUNTER — Observation Stay (HOSPITAL_COMMUNITY)
Admission: EM | Admit: 2015-10-14 | Discharge: 2015-10-15 | Disposition: A | Discharge status: Home / Self Care | Payer: PPO | Attending: Emergency Medicine | Admitting: Emergency Medicine

## 2015-10-14 ENCOUNTER — Emergency Department (HOSPITAL_COMMUNITY): Payer: PPO

## 2015-10-14 DIAGNOSIS — R5383 Other fatigue: Secondary | ICD-10-CM | POA: Insufficient documentation

## 2015-10-14 DIAGNOSIS — Z7984 Long term (current) use of oral hypoglycemic drugs: Secondary | ICD-10-CM | POA: Diagnosis not present

## 2015-10-14 DIAGNOSIS — D638 Anemia in other chronic diseases classified elsewhere: Secondary | ICD-10-CM | POA: Diagnosis not present

## 2015-10-14 DIAGNOSIS — E119 Type 2 diabetes mellitus without complications: Secondary | ICD-10-CM | POA: Insufficient documentation

## 2015-10-14 DIAGNOSIS — Z7982 Long term (current) use of aspirin: Secondary | ICD-10-CM | POA: Insufficient documentation

## 2015-10-14 DIAGNOSIS — E1165 Type 2 diabetes mellitus with hyperglycemia: Secondary | ICD-10-CM

## 2015-10-14 DIAGNOSIS — E1140 Type 2 diabetes mellitus with diabetic neuropathy, unspecified: Secondary | ICD-10-CM

## 2015-10-14 DIAGNOSIS — R0602 Shortness of breath: Secondary | ICD-10-CM | POA: Diagnosis present

## 2015-10-14 DIAGNOSIS — Z79899 Other long term (current) drug therapy: Secondary | ICD-10-CM | POA: Diagnosis not present

## 2015-10-14 DIAGNOSIS — D649 Anemia, unspecified: Secondary | ICD-10-CM | POA: Diagnosis not present

## 2015-10-14 DIAGNOSIS — C7951 Secondary malignant neoplasm of bone: Secondary | ICD-10-CM

## 2015-10-14 DIAGNOSIS — Z8546 Personal history of malignant neoplasm of prostate: Secondary | ICD-10-CM | POA: Diagnosis not present

## 2015-10-14 DIAGNOSIS — G893 Neoplasm related pain (acute) (chronic): Secondary | ICD-10-CM | POA: Diagnosis not present

## 2015-10-14 DIAGNOSIS — R000 Tachycardia, unspecified: Secondary | ICD-10-CM | POA: Diagnosis not present

## 2015-10-14 DIAGNOSIS — Z8673 Personal history of transient ischemic attack (TIA), and cerebral infarction without residual deficits: Secondary | ICD-10-CM | POA: Diagnosis not present

## 2015-10-14 DIAGNOSIS — Z794 Long term (current) use of insulin: Secondary | ICD-10-CM | POA: Insufficient documentation

## 2015-10-14 DIAGNOSIS — C61 Malignant neoplasm of prostate: Secondary | ICD-10-CM | POA: Diagnosis present

## 2015-10-14 DIAGNOSIS — R42 Dizziness and giddiness: Secondary | ICD-10-CM | POA: Insufficient documentation

## 2015-10-14 DIAGNOSIS — IMO0002 Reserved for concepts with insufficient information to code with codable children: Secondary | ICD-10-CM | POA: Diagnosis present

## 2015-10-14 DIAGNOSIS — I10 Essential (primary) hypertension: Secondary | ICD-10-CM | POA: Diagnosis not present

## 2015-10-14 LAB — URINALYSIS, ROUTINE W REFLEX MICROSCOPIC
Bilirubin Urine: NEGATIVE
GLUCOSE, UA: 500 mg/dL — AB
HGB URINE DIPSTICK: NEGATIVE
Ketones, ur: NEGATIVE mg/dL
LEUKOCYTES UA: NEGATIVE
Nitrite: NEGATIVE
PH: 7 (ref 5.0–8.0)
PROTEIN: NEGATIVE mg/dL
Specific Gravity, Urine: 1.007 (ref 1.005–1.030)

## 2015-10-14 LAB — COMPREHENSIVE METABOLIC PANEL
ALBUMIN: 3.6 g/dL (ref 3.5–5.0)
ALT: 19 U/L (ref 17–63)
AST: 24 U/L (ref 15–41)
Alkaline Phosphatase: 474 U/L — ABNORMAL HIGH (ref 38–126)
Anion gap: 9 (ref 5–15)
BILIRUBIN TOTAL: 0.6 mg/dL (ref 0.3–1.2)
BUN: 12 mg/dL (ref 6–20)
CHLORIDE: 103 mmol/L (ref 101–111)
CO2: 26 mmol/L (ref 22–32)
CREATININE: 0.49 mg/dL — AB (ref 0.61–1.24)
Calcium: 9.4 mg/dL (ref 8.9–10.3)
GFR calc Af Amer: >60 mL/min (ref >60)
GLUCOSE: 282 mg/dL — AB (ref 65–99)
POTASSIUM: 4 mmol/L (ref 3.5–5.1)
Sodium: 138 mmol/L (ref 135–145)
Total Protein: 6 g/dL — ABNORMAL LOW (ref 6.5–8.1)

## 2015-10-14 LAB — CBC WITH DIFFERENTIAL/PLATELET
BASOS ABS: 0 10*3/uL (ref 0.0–0.1)
Basophils Relative: 0 %
EOS PCT: 0 %
Eosinophils Absolute: 0 10*3/uL (ref 0.0–0.7)
HEMATOCRIT: 18.2 % — AB (ref 39.0–52.0)
Hemoglobin: 6.1 g/dL — CL (ref 13.0–17.0)
Lymphocytes Relative: 3 %
Lymphs Abs: 0.7 10*3/uL (ref 0.7–4.0)
MCH: 30 pg (ref 26.0–34.0)
MCHC: 33.5 g/dL (ref 30.0–36.0)
MCV: 89.7 fL (ref 78.0–100.0)
MONOS PCT: 1 %
Monocytes Absolute: 0.2 10*3/uL (ref 0.1–1.0)
NEUTROS PCT: 96 %
Neutro Abs: 22 10*3/uL — ABNORMAL HIGH (ref 1.7–7.7)
PLATELETS: 46 10*3/uL — AB (ref 150–400)
RBC: 2.03 MIL/uL — AB (ref 4.22–5.81)
RDW: 16.3 % — AB (ref 11.5–15.5)
WBC: 22.9 10*3/uL — AB (ref 4.0–10.5)

## 2015-10-14 LAB — GLUCOSE, CAPILLARY
GLUCOSE-CAPILLARY: 137 mg/dL — AB (ref 65–99)
GLUCOSE-CAPILLARY: 112 mg/dL — AB (ref 65–99)

## 2015-10-14 LAB — LIPASE, BLOOD: LIPASE: 28 U/L (ref 11–51)

## 2015-10-14 LAB — PREPARE RBC (CROSSMATCH)

## 2015-10-14 LAB — CBG MONITORING, ED: GLUCOSE-CAPILLARY: 326 mg/dL — AB (ref 65–99)

## 2015-10-14 MED ORDER — DORZOLAMIDE HCL 2 % OP SOLN
1.0000 [drp] | Freq: Two times a day (BID) | OPHTHALMIC | Status: DC
  Administered 2015-10-14 – 2015-10-15 (×2): 1 [drp] via OPHTHALMIC
  Filled 2015-10-14: qty 10

## 2015-10-14 MED ORDER — ACETAMINOPHEN 650 MG RE SUPP: 650.0000 mg | Freq: Four times a day (QID) | RECTAL | Status: DC | PRN

## 2015-10-14 MED ORDER — FUROSEMIDE 20 MG PO TABS
20.0000 mg | ORAL_TABLET | Freq: Every day | ORAL | Status: DC
  Administered 2015-10-15: 20 mg via ORAL
  Filled 2015-10-14: qty 1

## 2015-10-14 MED ORDER — LOSARTAN POTASSIUM 50 MG PO TABS
50.0000 mg | ORAL_TABLET | Freq: Every day | ORAL | Status: DC
  Administered 2015-10-15: 50 mg via ORAL
  Filled 2015-10-14: qty 1

## 2015-10-14 MED ORDER — TRAZODONE HCL 50 MG PO TABS: 25.0000 mg | ORAL_TABLET | Freq: Every evening | ORAL | Status: DC | PRN

## 2015-10-14 MED ORDER — BRIMONIDINE TARTRATE-TIMOLOL 0.2-0.5 % OP SOLN: 1.0000 [drp] | Freq: Two times a day (BID) | OPHTHALMIC | Status: DC

## 2015-10-14 MED ORDER — SODIUM CHLORIDE 0.9 % IV SOLN: 250.0000 mL | INTRAVENOUS | Status: DC | PRN

## 2015-10-14 MED ORDER — ALBUTEROL SULFATE (2.5 MG/3ML) 0.083% IN NEBU: 2.5000 mg | INHALATION_SOLUTION | RESPIRATORY_TRACT | Status: DC | PRN

## 2015-10-14 MED ORDER — ACETAMINOPHEN 325 MG PO TABS: 650.0000 mg | ORAL_TABLET | Freq: Four times a day (QID) | ORAL | Status: DC | PRN

## 2015-10-14 MED ORDER — POLYSACCHARIDE IRON COMPLEX 150 MG PO CAPS
150.0000 mg | ORAL_CAPSULE | Freq: Every day | ORAL | Status: DC
  Administered 2015-10-15: 150 mg via ORAL
  Filled 2015-10-14: qty 1

## 2015-10-14 MED ORDER — LATANOPROST 0.005 % OP SOLN
1.0000 [drp] | Freq: Every day | OPHTHALMIC | Status: DC
Start: 2015-10-14 — End: 2015-10-15
  Administered 2015-10-14: 1 [drp] via OPHTHALMIC
  Filled 2015-10-14: qty 2.5

## 2015-10-14 MED ORDER — TIMOLOL MALEATE 0.5 % OP SOLN
1.0000 [drp] | Freq: Two times a day (BID) | OPHTHALMIC | Status: DC
  Administered 2015-10-14 – 2015-10-15 (×2): 1 [drp] via OPHTHALMIC
  Filled 2015-10-14 (×3): qty 5

## 2015-10-14 MED ORDER — SODIUM CHLORIDE 0.9 % IV SOLN: INTRAVENOUS | Status: DC

## 2015-10-14 MED ORDER — ONDANSETRON HCL 4 MG/2ML IJ SOLN
4.0000 mg | Freq: Once | INTRAMUSCULAR | Status: AC
  Administered 2015-10-14: 4 mg via INTRAVENOUS
  Filled 2015-10-14: qty 2

## 2015-10-14 MED ORDER — SODIUM CHLORIDE 0.9% FLUSH: 3.0000 mL | INTRAVENOUS | Status: DC | PRN

## 2015-10-14 MED ORDER — ONDANSETRON HCL 4 MG PO TABS: 4.0000 mg | ORAL_TABLET | Freq: Four times a day (QID) | ORAL | Status: DC | PRN

## 2015-10-14 MED ORDER — ENSURE ENLIVE PO LIQD
237.0000 mL | Freq: Two times a day (BID) | ORAL | Status: DC
  Administered 2015-10-15: 237 mL via ORAL

## 2015-10-14 MED ORDER — CLOPIDOGREL BISULFATE 75 MG PO TABS
75.0000 mg | ORAL_TABLET | Freq: Every day | ORAL | Status: DC
  Administered 2015-10-15: 75 mg via ORAL
  Filled 2015-10-14: qty 1

## 2015-10-14 MED ORDER — ATORVASTATIN CALCIUM 40 MG PO TABS
40.0000 mg | ORAL_TABLET | Freq: Every day | ORAL | Status: DC
  Administered 2015-10-14: 40 mg via ORAL
  Filled 2015-10-14: qty 1

## 2015-10-14 MED ORDER — PROCHLORPERAZINE MALEATE 10 MG PO TABS: 10.0000 mg | ORAL_TABLET | Freq: Four times a day (QID) | ORAL | Status: DC | PRN

## 2015-10-14 MED ORDER — MEGESTROL ACETATE 400 MG/10ML PO SUSP
400.0000 mg | Freq: Two times a day (BID) | ORAL | Status: DC
  Administered 2015-10-14 – 2015-10-15 (×2): 400 mg via ORAL
  Filled 2015-10-14 (×2): qty 10

## 2015-10-14 MED ORDER — INSULIN DETEMIR 100 UNIT/ML ~~LOC~~ SOLN
10.0000 [IU] | Freq: Every day | SUBCUTANEOUS | Status: DC
  Administered 2015-10-14 – 2015-10-15 (×2): 10 [IU] via SUBCUTANEOUS
  Filled 2015-10-14 (×2): qty 0.1

## 2015-10-14 MED ORDER — BRIMONIDINE TARTRATE 0.2 % OP SOLN
1.0000 [drp] | Freq: Two times a day (BID) | OPHTHALMIC | Status: DC
  Administered 2015-10-14 – 2015-10-15 (×2): 1 [drp] via OPHTHALMIC
  Filled 2015-10-14: qty 5

## 2015-10-14 MED ORDER — SENNOSIDES-DOCUSATE SODIUM 8.6-50 MG PO TABS
1.0000 | ORAL_TABLET | Freq: Two times a day (BID) | ORAL | Status: DC
  Administered 2015-10-14 – 2015-10-15 (×2): 1 via ORAL
  Filled 2015-10-14 (×2): qty 1

## 2015-10-14 MED ORDER — FENTANYL 25 MCG/HR TD PT72
25.0000 ug | MEDICATED_PATCH | TRANSDERMAL | Status: DC
  Administered 2015-10-14: 25 ug via TRANSDERMAL
  Filled 2015-10-14: qty 1

## 2015-10-14 MED ORDER — SODIUM CHLORIDE 0.9% FLUSH
3.0000 mL | Freq: Two times a day (BID) | INTRAVENOUS | Status: DC
  Administered 2015-10-14: 3 mL via INTRAVENOUS

## 2015-10-14 MED ORDER — OXYCODONE HCL 5 MG PO TABS
5.0000 mg | ORAL_TABLET | ORAL | Status: DC | PRN
  Administered 2015-10-14 – 2015-10-15 (×2): 5 mg via ORAL
  Filled 2015-10-14 (×2): qty 1

## 2015-10-14 MED ORDER — GI COCKTAIL ~~LOC~~
30.0000 mL | Freq: Once | ORAL | Status: AC
  Administered 2015-10-14: 30 mL via ORAL
  Filled 2015-10-14: qty 30

## 2015-10-14 MED ORDER — ASPIRIN 81 MG PO CHEW
81.0000 mg | CHEWABLE_TABLET | Freq: Every day | ORAL | Status: DC
  Administered 2015-10-15: 81 mg via ORAL
  Filled 2015-10-14: qty 1

## 2015-10-14 MED ORDER — SODIUM CHLORIDE 0.9 % IV SOLN: Freq: Once | INTRAVENOUS | Status: DC

## 2015-10-14 MED ORDER — LACTATED RINGERS IV BOLUS (SEPSIS): 1000.0000 mL | Freq: Once | INTRAVENOUS | Status: DC

## 2015-10-14 MED ORDER — DEXAMETHASONE 4 MG PO TABS
4.0000 mg | ORAL_TABLET | Freq: Every day | ORAL | Status: DC
  Administered 2015-10-14 – 2015-10-15 (×2): 4 mg via ORAL
  Filled 2015-10-14 (×2): qty 1

## 2015-10-14 MED ORDER — ONDANSETRON HCL 4 MG/2ML IJ SOLN: 4.0000 mg | Freq: Four times a day (QID) | INTRAMUSCULAR | Status: DC | PRN

## 2015-10-14 MED ORDER — INSULIN ASPART 100 UNIT/ML ~~LOC~~ SOLN: 0.0000 [IU] | Freq: Every day | SUBCUTANEOUS | Status: DC

## 2015-10-14 MED ORDER — BISACODYL 10 MG RE SUPP
10.0000 mg | Freq: Every day | RECTAL | Status: DC
  Administered 2015-10-15: 10 mg via RECTAL
  Filled 2015-10-14: qty 1

## 2015-10-14 MED ORDER — INSULIN ASPART 100 UNIT/ML ~~LOC~~ SOLN
0.0000 [IU] | Freq: Three times a day (TID) | SUBCUTANEOUS | Status: DC
  Administered 2015-10-14: 2 [IU] via SUBCUTANEOUS
  Administered 2015-10-15: 5 [IU] via SUBCUTANEOUS

## 2015-10-14 NOTE — ED Notes (Signed)
Patient states that all symptoms have resolved. He has no complaints.

## 2015-10-14 NOTE — H&P (Signed)
[facility] History and Physical  Ricky Terry Ricky Terry:[id number] DOB: [birth year] DOA: 10/14/2015   PCP: [name], MD  Specialists: Dr Alen Blew  Chief Complaint: Abdominal pain, nausea, shortness of breath  HPI: Ricky Terry is a 70 y.o. male with a past medical history of prostate cancer with metastases, cancer related pain, diabetes on insulin, who is receiving chemotherapy for his cancer. His last chemotherapy was on February 22, when he received Taxotere. He received Neulasta infusion on February 24. Patient presented with onset of shortness of breath with exertion, abdominal pain, nausea. He was given Zofran by EMS and his symptoms started subsiding. He denies any chest pain. He was having abdominal pain on and off, which is chronic. Currently pain is improved. His oncologist recently switched him over from OxyContin to fentanyl patch. However, patient has not been able to fill this medication quite yet.  In the emergency department he was found to have a hemoglobin of 6.1. He will need blood transfusion and will be observed overnight.  Home Medications: Prior to Admission medications   Medication Sig Start Date End Date Taking? Authorizing Provider  aspirin 81 MG chewable tablet Chew 1 tablet (81 mg total) by mouth daily. 06/14/15  Yes Verlee Monte, MD  atorvastatin (LIPITOR) 40 MG tablet Take 1 tablet (40 mg total) by mouth at bedtime. 06/14/15  Yes Verlee Monte, MD  bisacodyl (DULCOLAX) 10 MG suppository Place 1 suppository (10 mg total) rectally daily. 07/19/15  Yes Theodis Blaze, MD  cholecalciferol (VITAMIN D) 400 UNITS TABS tablet Take 400 Units by mouth daily.   Yes Historical Provider, MD  clopidogrel (PLAVIX) 75 MG tablet Take 1 tablet (75 mg total) by mouth daily with breakfast. 06/14/15  Yes Mutaz Elmahi, MD  COMBIGAN 0.2-0.5 % ophthalmic solution Place 1 drop into both eyes every 12 (twelve) hours.  10/04/15  Yes Historical Provider, MD  dorzolamide (TRUSOPT) 2  % ophthalmic solution Place into both eyes 2 (two) times daily.  10/06/15  Yes Historical Provider, MD  feeding supplement, ENSURE ENLIVE, (ENSURE ENLIVE) LIQD Take 237 mLs by mouth 2 (two) times daily between meals. 08/06/15  Yes Barton Dubois, MD  fentaNYL (DURAGESIC) 25 MCG/HR patch Place 1 patch (25 mcg total) onto the skin every 3 (three) days. 10/11/15  Yes Wyatt Portela, MD  furosemide (LASIX) 20 MG tablet Take 1 tablet (20 mg total) by mouth daily. 08/23/15  Yes Gery Pray, MD  glipiZIDE (GLUCOTROL) 10 MG tablet Take 1 tablet (10 mg total) by mouth daily before breakfast. 06/14/15  Yes Verlee Monte, MD  insulin aspart (NOVOLOG) 100 UNIT/ML injection Inject 0-20 Units into the skin 3 (three) times daily with meals. Patient taking differently: Inject 10-25 Units into the skin 3 (three) times daily with meals.  07/19/15  Yes Theodis Blaze, MD  Insulin Detemir (LEVEMIR) 100 UNIT/ML Pen Inject 10 Units into the skin daily. Patient taking differently: Inject 15 Units into the skin daily.  08/06/15  Yes Barton Dubois, MD  iron polysaccharides (NIFEREX) 150 MG capsule Take 1 capsule (150 mg total) by mouth daily. 08/06/15  Yes Barton Dubois, MD  lidocaine-prilocaine (EMLA) cream Apply 1 application topically as needed. 05/23/15  Yes Wyatt Portela, MD  losartan (COZAAR) 50 MG tablet Take 1 tablet (50 mg total) by mouth daily. 06/14/15  Yes Mutaz Elmahi, MD  LUMIGAN 0.01 % SOLN Place 1 drop into both eyes at bedtime.  10/04/15  Yes Historical Provider, MD  megestrol (MEGACE) 400 MG/10ML suspension Take  10 mLs (400 mg total) by mouth 2 (two) times daily. 09/20/15  Yes Wyatt Portela, MD  metFORMIN (GLUCOPHAGE) 1000 MG tablet Take 1 tablet (1,000 mg total) by mouth 2 (two) times daily. 06/14/15  Yes Verlee Monte, MD  oxyCODONE (OXY IR/ROXICODONE) 5 MG immediate release tablet Take 1 tablet (5 mg total) by mouth every 4 (four) hours as needed for moderate pain. 10/08/15  Yes Fredia Sorrow, MD    prochlorperazine (COMPAZINE) 10 MG tablet Take 1 tablet (10 mg total) by mouth every 6 (six) hours as needed for nausea or vomiting. 06/28/15  Yes Wyatt Portela, MD  senna-docusate (SENOKOT-S) 8.6-50 MG tablet Take 1 tablet by mouth 2 (two) times daily. 07/16/15  Yes Theodis Blaze, MD  traZODone (DESYREL) 50 MG tablet Take 0.5 tablets (25 mg total) by mouth at bedtime as needed for sleep. 08/30/15  Yes Wyatt Portela, MD  dexamethasone (DECADRON) 4 MG tablet Take 1 tablet (4 mg total) by mouth 3 (three) times daily. 07/19/15   Theodis Blaze, MD    Allergies: No Known Allergies  Past Medical History: Past Medical History  Diagnosis Date  . Stroke ([place])   . Diabetes mellitus   . Hypertension   . Prostate cancer ([place])   . Bone metastases ([place])   . Prostate cancer [facility])     Past Surgical History  Procedure Laterality Date  . Eye surgery      Social History: Patient lives alone in [place]. Denies smoking, alcohol use or illicit drug use. Usually independent with daily activities.   Family History:  Family History  Problem Relation Age of Onset  . Cancer Mother   . Hypertension Mother   . Hypertension Father   . Cancer Father   . Diabetes Sister   . Hypertension Sister   . Hypertension Brother   . Diabetes Brother      Review of Systems - History obtained from the patient General ROS: positive for  - fatigue Psychological ROS: negative Ophthalmic ROS: negative ENT ROS: negative Allergy and Immunology ROS: negative Hematological and Lymphatic ROS: negative Endocrine ROS: negative Respiratory ROS: as in hpi Cardiovascular ROS: no chest pain or dyspnea on exertion Gastrointestinal ROS: as in hpi Genito-Urinary ROS: no dysuria, trouble voiding, or hematuria Musculoskeletal ROS: negative Neurological ROS: no TIA or stroke symptoms Dermatological ROS: negative  Physical Examination  Filed Vitals:   10/14/15 1400 10/14/15 1430 10/14/15 1500 10/14/15 1530  BP: 169/75  157/63 144/69 155/68  Pulse: 102 99 100 102  Temp:      TempSrc:      Resp: '17 25 19 18  ' SpO2: 99% 100% 98% 100%    BP 155/68 mmHg  Pulse 102  Temp(Src) 97.9 F (36.6 C) (Oral)  Resp 18  SpO2 100%  General appearance: alert, cooperative, appears stated age and no distress Head: Normocephalic, without obvious abnormality, atraumatic Eyes: conjunctivae/corneas clear. PERRL, EOM's intact.  Throat: lips, mucosa, and tongue normal; teeth and gums normal Neck: no adenopathy, no carotid bruit, no JVD, supple, symmetrical, trachea midline and thyroid not enlarged, symmetric, no tenderness/mass/nodules Resp: clear to auscultation bilaterally Cardio: regular rate and rhythm, S1, S2 normal, no murmur, click, rub or gallop GI: soft, non-tender; bowel sounds normal; no masses,  no organomegaly Extremities: extremities normal, atraumatic, no cyanosis or edema Pulses: 2+ and symmetric Skin: Skin color, texture, turgor normal. No rashes or lesions Neurologic: no focal deficits  Laboratory Data: Results for orders placed or performed during the hospital  encounter of 10/14/15 (from the past 48 hour(s))  CBG monitoring, ED     Status: Abnormal   Collection Time: 10/14/15  1:53 PM  Result Value Ref Range   Glucose-Capillary 326 (H) 65 - 99 mg/dL  Urinalysis, Routine w reflex microscopic (not at [facility])     Status: Abnormal   Collection Time: 10/14/15  2:34 PM  Result Value Ref Range   Color, Urine YELLOW YELLOW   APPearance CLEAR CLEAR   Specific Gravity, Urine 1.007 1.005 - 1.030   pH 7.0 5.0 - 8.0   Glucose, UA 500 (A) NEGATIVE mg/dL   Hgb urine dipstick NEGATIVE NEGATIVE   Bilirubin Urine NEGATIVE NEGATIVE   Ketones, ur NEGATIVE NEGATIVE mg/dL   Protein, ur NEGATIVE NEGATIVE mg/dL   Nitrite NEGATIVE NEGATIVE   Leukocytes, UA NEGATIVE NEGATIVE    Comment: MICROSCOPIC NOT DONE ON URINES WITH NEGATIVE PROTEIN, BLOOD, LEUKOCYTES, NITRITE, OR GLUCOSE <1000 mg/dL.  CBC with  Differential/Platelet     Status: Abnormal   Collection Time: 10/14/15  2:47 PM  Result Value Ref Range   WBC 22.9 (H) 4.0 - 10.5 K/uL   RBC 2.03 (L) 4.22 - 5.81 MIL/uL   Hemoglobin 6.1 (LL) 13.0 - 17.0 g/dL    Comment: RESULT REPEATED AND VERIFIED CRITICAL RESULT CALLED TO, READ BACK BY AND VERIFIED WITH: THORNTON,S AT 1530 ON [id number] BY HOOKER,B    HCT 18.2 (L) 39.0 - 52.0 %   MCV 89.7 78.0 - 100.0 fL   MCH 30.0 26.0 - 34.0 pg   MCHC 33.5 30.0 - 36.0 g/dL   RDW 16.3 (H) 11.5 - 15.5 %   Platelets 46 (L) 150 - 400 K/uL    Comment: RESULT REPEATED AND VERIFIED SPECIMEN CHECKED FOR CLOTS PLATELET COUNT CONFIRMED BY SMEAR    Neutrophils Relative % 96 %   Lymphocytes Relative 3 %   Monocytes Relative 1 %   Eosinophils Relative 0 %   Basophils Relative 0 %   Neutro Abs 22.0 (H) 1.7 - 7.7 K/uL   Lymphs Abs 0.7 0.7 - 4.0 K/uL   Monocytes Absolute 0.2 0.1 - 1.0 K/uL   Eosinophils Absolute 0.0 0.0 - 0.7 K/uL   Basophils Absolute 0.0 0.0 - 0.1 K/uL   RBC Morphology TEARDROP CELLS   Comprehensive metabolic panel     Status: Abnormal   Collection Time: 10/14/15  2:47 PM  Result Value Ref Range   Sodium 138 135 - 145 mmol/L   Potassium 4.0 3.5 - 5.1 mmol/L   Chloride 103 101 - 111 mmol/L   CO2 26 22 - 32 mmol/L   Glucose, Bld 282 (H) 65 - 99 mg/dL   BUN 12 6 - 20 mg/dL   Creatinine, Ser 0.49 (L) 0.61 - 1.24 mg/dL   Calcium 9.4 8.9 - 10.3 mg/dL   Total Protein 6.0 (L) 6.5 - 8.1 g/dL   Albumin 3.6 3.5 - 5.0 g/dL   AST 24 15 - 41 U/L   ALT 19 17 - 63 U/L   Alkaline Phosphatase 474 (H) 38 - 126 U/L   Total Bilirubin 0.6 0.3 - 1.2 mg/dL   GFR calc non Af Amer >60 >60 mL/min   GFR calc Af Amer >60 >60 mL/min    Comment: (NOTE) The eGFR has been calculated using the CKD EPI equation. This calculation has not been validated in all clinical situations. eGFR's persistently <60 mL/min signify possible Chronic Kidney Disease.    Anion gap 9 5 - 15  Lipase, blood  Status: None    Collection Time: 10/14/15  2:47 PM  Result Value Ref Range   Lipase 28 11 - 51 U/L  Type and screen [place]     Status: None (Preliminary result)   Collection Time: 10/14/15  4:02 PM  Result Value Ref Range   ABO/RH(D) A POS    Antibody Screen NEG    Sample Expiration 10/17/2015    Unit Number [id number]    Blood Component Type RED CELLS,LR    Unit division 00    Status of Unit ALLOCATED    Transfusion Status OK TO TRANSFUSE    Crossmatch Result Compatible    Unit Number [id number]    Blood Component Type RED CELLS,LR    Unit division 00    Status of Unit ALLOCATED    Transfusion Status OK TO TRANSFUSE    Crossmatch Result Compatible   Prepare RBC     Status: None   Collection Time: 10/14/15  4:02 PM  Result Value Ref Range   Order Confirmation ORDER PROCESSED BY BLOOD BANK     Radiology Reports: Dg Abd Acute W/chest  10/14/2015  CLINICAL DATA:  Shortness of breath and abdominal pain EXAM: DG ABDOMEN ACUTE W/ 1V CHEST COMPARISON:  None. FINDINGS: Cardiac shadow is within normal limits. The lungs are well aerated bilaterally. A right chest wall port is seen in satisfactory position. No focal infiltrate is seen. Scattered large and small bowel gas is noted. No free air is noted. Diffuse sclerotic foci are noted throughout the bony structures consistent with the given clinical history of metastatic prostate carcinoma. No obstructive changes are seen. IMPRESSION: Changes of metastatic disease to the bony structures. No acute abnormality is noted. Electronically Signed   By: Inez Catalina M.D.   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 Most of his symptoms have subsided. However, he is found to have severe anemia requiring blood transfusion. His abdominal pain is most likely his chronic cancer related pain.  Plan: #1 Symptomatic anemia: He'll be transfused 2 units of blood. Repeat hemoglobin tomorrow morning.  #2 History of metastatic prostate cancer on chemotherapy, leukocytosis and thrombocytopenia: Stable. He is followed closely by oncologist. His last chemotherapy was on February 22. Elevated WBC is noted, which could be due to dexamethasone. Thrombocytopenia is noted, which could be due to his chemotherapy. Counts will be repeated tomorrow morning. Patient noted to be on dexamethasone, which will be continued. No active bleeding is noted. Elevated alkaline phosphatase is due to bony metastases.  #3 Cancer related pain: Patient was recently switched over from long-acting OxyContin to a fentanyl patch. However, he has not been able to fill this prescription yet. We will initiate fentanyl patch in the hospital and he should be able to get his medications filled early next week. Continue as needed oxycodone. Abdominal film did not show any concerning findings. Patient also underwent CT scan of his abdomen, pelvis about 6 days ago, which also did not show any acute findings. Worsening bony metastatic disease was seen.  #  4 diabetes mellitus type 2, uncontrolled, on long-term insulin: Continue Levemir, which he has not taken today. Sliding scale insulin coverage. HbA1c in December was 9.7  #5 history of benign essential hypertension: Monitor blood pressures closely.   DVT Prophylaxis: SCDs Code Status: Full code Family Communication: Discussed with the patient  Disposition Plan: Admit to MedSurg   Further management decisions will depend on results of further testing and patient's response to  treatment.   [facility]  [facility] Pager [phone]  If 7PM-7AM, please contact night-coverage [email or web address] Password [facility]  10/14/2015, 5:46 PM

## 2015-10-14 NOTE — ED Notes (Addendum)
Patient states that today he got up to use the bathroom and afterwards he had a sudden onset of shortness of breath, nausea, abdominal pain. Patient has prostate cancer and is currently receiving chemo with las treatment last week. Patient was given 4mg  of zofran by EMS and he the nausea and shortness of breath has subsided. He only has abdominal pain now. Patient has a blood sugar of 448 with EMS.

## 2015-10-14 NOTE — ED Notes (Signed)
Patient transported to X-ray 

## 2015-10-14 NOTE — ED Provider Notes (Signed)
CSN: [id number]     Arrival date & time 10/14/15  1334 History   First MD Initiated Contact with Patient 10/14/15 1449     Chief Complaint  Patient presents with  . Shortness of Breath  . Nausea     (Consider location/radiation/quality/duration/timing/severity/associated sxs/prior Treatment) Patient is a 70 y.o. male presenting with abdominal pain.  Abdominal Pain Pain location:  RLQ and LLQ Pain quality: aching   Pain radiates to:  Does not radiate Pain severity:  Moderate Duration:  3 days Progression:  Waxing and waning Chronicity:  Chronic Context: not alcohol use, not diet changes, not previous surgeries and not recent illness   Relieved by:  None tried Worsened by:  Nothing tried Ineffective treatments:  None tried Associated symptoms: fatigue and nausea   Associated symptoms: no chills, no constipation, no diarrhea, no fever and no vomiting     Past Medical History  Diagnosis Date  . Stroke ([place])   . Diabetes mellitus   . Hypertension   . Prostate cancer ([place])   . Bone metastases ([place])   . Prostate cancer [facility])    Past Surgical History  Procedure Laterality Date  . Eye surgery     Family History  Problem Relation Age of Onset  . Cancer Mother   . Hypertension Mother   . Hypertension Father   . Cancer Father   . Diabetes Sister   . Hypertension Sister   . Hypertension Brother   . Diabetes Brother    Social History  Substance Use Topics  . Smoking status: Never Smoker   . Smokeless tobacco: Never Used  . Alcohol Use: No    Review of Systems  Constitutional: Positive for fatigue. Negative for fever and chills.  Gastrointestinal: Positive for nausea and abdominal pain. Negative for vomiting, diarrhea, constipation and abdominal distention.  Musculoskeletal: Negative for joint swelling and gait problem.  Skin: Negative for pallor and wound.  Neurological: Positive for light-headedness. Negative for headaches.  All other systems reviewed and are  negative.     Allergies  Review of patient's allergies indicates no known allergies.  Home Medications   Prior to Admission medications   Medication Sig Start Date End Date Taking? Authorizing Provider  aspirin 81 MG chewable tablet Chew 1 tablet (81 mg total) by mouth daily. 06/14/15  Yes Verlee Monte, MD  atorvastatin (LIPITOR) 40 MG tablet Take 1 tablet (40 mg total) by mouth at bedtime. 06/14/15  Yes Verlee Monte, MD  bisacodyl (DULCOLAX) 10 MG suppository Place 1 suppository (10 mg total) rectally daily. 07/19/15  Yes Theodis Blaze, MD  cholecalciferol (VITAMIN D) 400 UNITS TABS tablet Take 400 Units by mouth daily.   Yes Historical Provider, MD  clopidogrel (PLAVIX) 75 MG tablet Take 1 tablet (75 mg total) by mouth daily with breakfast. 06/14/15  Yes Mutaz Elmahi, MD  COMBIGAN 0.2-0.5 % ophthalmic solution Place 1 drop into both eyes every 12 (twelve) hours.  10/04/15  Yes Historical Provider, MD  dorzolamide (TRUSOPT) 2 % ophthalmic solution Place into both eyes 2 (two) times daily.  10/06/15  Yes Historical Provider, MD  feeding supplement, ENSURE ENLIVE, (ENSURE ENLIVE) LIQD Take 237 mLs by mouth 2 (two) times daily between meals. 08/06/15  Yes Barton Dubois, MD  fentaNYL (DURAGESIC) 25 MCG/HR patch Place 1 patch (25 mcg total) onto the skin every 3 (three) days. 10/11/15  Yes Wyatt Portela, MD  furosemide (LASIX) 20 MG tablet Take 1 tablet (20 mg total) by mouth daily. 08/23/15  Yes Gery Pray, MD  glipiZIDE (GLUCOTROL) 10 MG tablet Take 1 tablet (10 mg total) by mouth daily before breakfast. 06/14/15  Yes Verlee Monte, MD  insulin aspart (NOVOLOG) 100 UNIT/ML injection Inject 0-20 Units into the skin 3 (three) times daily with meals. Patient taking differently: Inject 10-25 Units into the skin 3 (three) times daily with meals.  07/19/15  Yes Theodis Blaze, MD  Insulin Detemir (LEVEMIR) 100 UNIT/ML Pen Inject 10 Units into the skin daily. Patient taking differently: Inject 15 Units  into the skin daily.  08/06/15  Yes Barton Dubois, MD  iron polysaccharides (NIFEREX) 150 MG capsule Take 1 capsule (150 mg total) by mouth daily. 08/06/15  Yes Barton Dubois, MD  lidocaine-prilocaine (EMLA) cream Apply 1 application topically as needed. 05/23/15  Yes Wyatt Portela, MD  losartan (COZAAR) 50 MG tablet Take 1 tablet (50 mg total) by mouth daily. 06/14/15  Yes Mutaz Elmahi, MD  LUMIGAN 0.01 % SOLN Place 1 drop into both eyes at bedtime.  10/04/15  Yes Historical Provider, MD  megestrol (MEGACE) 400 MG/10ML suspension Take 10 mLs (400 mg total) by mouth 2 (two) times daily. 09/20/15  Yes Wyatt Portela, MD  metFORMIN (GLUCOPHAGE) 1000 MG tablet Take 1 tablet (1,000 mg total) by mouth 2 (two) times daily. 06/14/15  Yes Verlee Monte, MD  oxyCODONE (OXY IR/ROXICODONE) 5 MG immediate release tablet Take 1 tablet (5 mg total) by mouth every 4 (four) hours as needed for moderate pain. 10/08/15  Yes Fredia Sorrow, MD  prochlorperazine (COMPAZINE) 10 MG tablet Take 1 tablet (10 mg total) by mouth every 6 (six) hours as needed for nausea or vomiting. 06/28/15  Yes Wyatt Portela, MD  senna-docusate (SENOKOT-S) 8.6-50 MG tablet Take 1 tablet by mouth 2 (two) times daily. 07/16/15  Yes Theodis Blaze, MD  traZODone (DESYREL) 50 MG tablet Take 0.5 tablets (25 mg total) by mouth at bedtime as needed for sleep. 08/30/15  Yes Wyatt Portela, MD  dexamethasone (DECADRON) 4 MG tablet Take 1 tablet (4 mg total) by mouth 3 (three) times daily. 07/19/15   Theodis Blaze, MD   BP 155/68 mmHg  Pulse 102  Temp(Src) 97.9 F (36.6 C) (Oral)  Resp 18  SpO2 100% Physical Exam  Constitutional: He is oriented to person, place, and time. He appears well-developed and well-nourished.  HENT:  Head: Normocephalic and atraumatic.  Neck: Normal range of motion.  Cardiovascular: Tachycardia present.   Pulmonary/Chest: Effort normal. No respiratory distress.  Abdominal: Soft. Bowel sounds are normal. He exhibits no  distension and no mass. There is no tenderness. There is no rebound and no guarding.  Musculoskeletal: Normal range of motion. He exhibits no edema or tenderness.  Neurological: He is alert and oriented to person, place, and time. No cranial nerve deficit. Coordination normal.  Skin: Skin is warm. No rash noted. No erythema.  Nursing note and vitals reviewed.   ED Course  Procedures (including critical care time) Labs Review Labs Reviewed  CBC WITH DIFFERENTIAL/PLATELET - Abnormal; Notable for the following:    WBC 22.9 (*)    RBC 2.03 (*)    Hemoglobin 6.1 (*)    HCT 18.2 (*)    RDW 16.3 (*)    Platelets 46 (*)    Neutro Abs 22.0 (*)    All other components within normal limits  COMPREHENSIVE METABOLIC PANEL - Abnormal; Notable for the following:    Glucose, Bld 282 (*)    Creatinine, Ser  0.49 (*)    Total Protein 6.0 (*)    Alkaline Phosphatase 474 (*)    All other components within normal limits  URINALYSIS, ROUTINE W REFLEX MICROSCOPIC (NOT AT [facility]) - Abnormal; Notable for the following:    Glucose, UA 500 (*)    All other components within normal limits  CBG MONITORING, ED - Abnormal; Notable for the following:    Glucose-Capillary 326 (*)    All other components within normal limits  LIPASE, BLOOD  OCCULT BLOOD X 1 CARD TO LAB, STOOL  TYPE AND SCREEN  PREPARE RBC (CROSSMATCH)    Imaging Review Dg Abd Acute W/chest  10/14/2015  CLINICAL DATA:  Shortness of breath and abdominal pain EXAM: DG ABDOMEN ACUTE W/ 1V CHEST COMPARISON:  None. FINDINGS: Cardiac shadow is within normal limits. The lungs are well aerated bilaterally. A right chest wall port is seen in satisfactory position. No focal infiltrate is seen. Scattered large and small bowel gas is noted. No free air is noted. Diffuse sclerotic foci are noted throughout the bony structures consistent with the given clinical history of metastatic prostate carcinoma. No obstructive changes are seen. IMPRESSION: Changes of  metastatic disease to the bony structures. No acute abnormality is noted. Electronically Signed   By: Inez Catalina M.D.   On: 10/14/2015 15:55   I have personally reviewed and evaluated these images and lab results as part of my medical decision-making.   EKG Interpretation None      MDM   Final diagnoses:  Symptomatic anemia  SOB (shortness of breath)  Light headed   H/o Prostate cancer and chronic abdominal pain. Here with flare of same. Also with an episode of dizziness, hyperglycemia and one episode of dyspnea. No acute complaints at this time. Exam benign as above aside from slight tachycardia. Possibly anemia, dehydration or cancer related symptoms.  Will fluid hydrate, check basic labs and attempt ambulation with recheck of symptoms.  Anemic at 6.1 which is 1.5 lower than a week ago. Likely related to symptoms. H/o same, requiring transfusion, also with leukocytosis, recent neulasta injection is likely cause. No fever or concern for infection here.      Merrily Pew, MD 10/14/15 [phone]

## 2015-10-14 NOTE — ED Notes (Signed)
Bed: WA08 Expected date:  Expected time:  Means of arrival:  Comments: EMS- Abd pain, Ca pt

## 2015-10-15 ENCOUNTER — Emergency Department (HOSPITAL_COMMUNITY): Payer: PPO

## 2015-10-15 ENCOUNTER — Encounter (HOSPITAL_COMMUNITY): Payer: Self-pay

## 2015-10-15 ENCOUNTER — Emergency Department (HOSPITAL_COMMUNITY)
Admission: EM | Admit: 2015-10-15 | Discharge: 2015-10-16 | Disposition: A | Discharge status: Home / Self Care | Payer: PPO | Attending: Emergency Medicine | Admitting: Emergency Medicine

## 2015-10-15 DIAGNOSIS — Z79899 Other long term (current) drug therapy: Secondary | ICD-10-CM | POA: Insufficient documentation

## 2015-10-15 DIAGNOSIS — E119 Type 2 diabetes mellitus without complications: Secondary | ICD-10-CM | POA: Diagnosis not present

## 2015-10-15 DIAGNOSIS — Y9389 Activity, other specified: Secondary | ICD-10-CM | POA: Insufficient documentation

## 2015-10-15 DIAGNOSIS — Z8673 Personal history of transient ischemic attack (TIA), and cerebral infarction without residual deficits: Secondary | ICD-10-CM | POA: Diagnosis not present

## 2015-10-15 DIAGNOSIS — Z794 Long term (current) use of insulin: Secondary | ICD-10-CM | POA: Diagnosis not present

## 2015-10-15 DIAGNOSIS — Z7902 Long term (current) use of antithrombotics/antiplatelets: Secondary | ICD-10-CM | POA: Insufficient documentation

## 2015-10-15 DIAGNOSIS — S4992XA Unspecified injury of left shoulder and upper arm, initial encounter: Secondary | ICD-10-CM | POA: Diagnosis not present

## 2015-10-15 DIAGNOSIS — Y92410 Unspecified street and highway as the place of occurrence of the external cause: Secondary | ICD-10-CM | POA: Insufficient documentation

## 2015-10-15 DIAGNOSIS — Z85830 Personal history of malignant neoplasm of bone: Secondary | ICD-10-CM | POA: Insufficient documentation

## 2015-10-15 DIAGNOSIS — Y998 Other external cause status: Secondary | ICD-10-CM | POA: Insufficient documentation

## 2015-10-15 DIAGNOSIS — Y9289 Other specified places as the place of occurrence of the external cause: Secondary | ICD-10-CM | POA: Diagnosis not present

## 2015-10-15 DIAGNOSIS — S42212A Unspecified displaced fracture of surgical neck of left humerus, initial encounter for closed fracture: Secondary | ICD-10-CM

## 2015-10-15 DIAGNOSIS — D508 Other iron deficiency anemias: Secondary | ICD-10-CM

## 2015-10-15 DIAGNOSIS — I10 Essential (primary) hypertension: Secondary | ICD-10-CM | POA: Insufficient documentation

## 2015-10-15 DIAGNOSIS — Z8546 Personal history of malignant neoplasm of prostate: Secondary | ICD-10-CM | POA: Diagnosis not present

## 2015-10-15 DIAGNOSIS — S42215A Unspecified nondisplaced fracture of surgical neck of left humerus, initial encounter for closed fracture: Secondary | ICD-10-CM | POA: Insufficient documentation

## 2015-10-15 DIAGNOSIS — Z7984 Long term (current) use of oral hypoglycemic drugs: Secondary | ICD-10-CM | POA: Diagnosis not present

## 2015-10-15 DIAGNOSIS — Z7982 Long term (current) use of aspirin: Secondary | ICD-10-CM | POA: Diagnosis not present

## 2015-10-15 DIAGNOSIS — G893 Neoplasm related pain (acute) (chronic): Secondary | ICD-10-CM | POA: Diagnosis not present

## 2015-10-15 DIAGNOSIS — Z7952 Long term (current) use of systemic steroids: Secondary | ICD-10-CM | POA: Insufficient documentation

## 2015-10-15 DIAGNOSIS — S6992XA Unspecified injury of left wrist, hand and finger(s), initial encounter: Secondary | ICD-10-CM | POA: Diagnosis present

## 2015-10-15 DIAGNOSIS — D638 Anemia in other chronic diseases classified elsewhere: Secondary | ICD-10-CM | POA: Diagnosis not present

## 2015-10-15 LAB — CBC
HCT: 27 % — ABNORMAL LOW (ref 39.0–52.0)
HEMOGLOBIN: 8.9 g/dL — AB (ref 13.0–17.0)
MCH: 29.5 pg (ref 26.0–34.0)
MCHC: 33 g/dL (ref 30.0–36.0)
MCV: 89.4 fL (ref 78.0–100.0)
PLATELETS: 52 10*3/uL — AB (ref 150–400)
RBC: 3.02 MIL/uL — AB (ref 4.22–5.81)
RDW: 16.2 % — ABNORMAL HIGH (ref 11.5–15.5)
WBC: 35.5 10*3/uL — AB (ref 4.0–10.5)

## 2015-10-15 LAB — COMPREHENSIVE METABOLIC PANEL
ALK PHOS: 503 U/L — AB (ref 38–126)
ALT: 17 U/L (ref 17–63)
ANION GAP: 10 (ref 5–15)
AST: 27 U/L (ref 15–41)
Albumin: 3.6 g/dL (ref 3.5–5.0)
BUN: 11 mg/dL (ref 6–20)
CALCIUM: 8.8 mg/dL — AB (ref 8.9–10.3)
CO2: 23 mmol/L (ref 22–32)
CREATININE: 0.47 mg/dL — AB (ref 0.61–1.24)
Chloride: 103 mmol/L (ref 101–111)
Glucose, Bld: 289 mg/dL — ABNORMAL HIGH (ref 65–99)
Potassium: 4.2 mmol/L (ref 3.5–5.1)
Sodium: 136 mmol/L (ref 135–145)
Total Bilirubin: 1.2 mg/dL (ref 0.3–1.2)
Total Protein: 5.8 g/dL — ABNORMAL LOW (ref 6.5–8.1)

## 2015-10-15 LAB — GLUCOSE, CAPILLARY: GLUCOSE-CAPILLARY: 229 mg/dL — AB (ref 65–99)

## 2015-10-15 MED ORDER — SODIUM CHLORIDE 0.9 % IV BOLUS (SEPSIS)
500.0000 mL | Freq: Once | INTRAVENOUS | Status: AC
  Administered 2015-10-16: 500 mL via INTRAVENOUS

## 2015-10-15 MED ORDER — MEGESTROL ACETATE 400 MG/10ML PO SUSP: 400.0000 mg | Freq: Two times a day (BID) | ORAL | Status: AC

## 2015-10-15 MED ORDER — INSULIN DETEMIR 100 UNIT/ML FLEXPEN: 15.0000 [IU] | PEN_INJECTOR | Freq: Every day | SUBCUTANEOUS | Status: AC

## 2015-10-15 MED ORDER — HEPARIN SOD (PORK) LOCK FLUSH 100 UNIT/ML IV SOLN
500.0000 [IU] | Freq: Once | INTRAVENOUS | Status: AC
  Administered 2015-10-15: 500 [IU] via INTRAVENOUS

## 2015-10-15 MED ORDER — HYDROMORPHONE HCL 1 MG/ML IJ SOLN
1.0000 mg | Freq: Once | INTRAMUSCULAR | Status: AC
  Administered 2015-10-16: 1 mg via INTRAVENOUS
  Filled 2015-10-15: qty 1

## 2015-10-15 NOTE — Progress Notes (Signed)
Discharge instructions explained to pt and his daughter. Prescriptions called to [place]. Pt dc'd via wheelchair.

## 2015-10-15 NOTE — ED Notes (Signed)
Bed: [id number] Expected date:  Expected time:  Means of arrival:  Comments: Hold for hall b
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
## 2015-10-15 NOTE — Discharge Summary (Addendum)
Physician Discharge Summary  RAMEY BYUN [id number] DOB: 09/05/1945 DOA: 10/14/2015  PCP: [name], MD  Admit date: 10/14/2015 Discharge date: 10/15/2015  Time spent: 30 minutes  Recommendations for Outpatient Follow-up:  1. CBC in 3-4 wk  Discharge Condition: stable    Discharge Diagnoses:  Principal Problem:   Anemia Active Problems:   DM type 2, uncontrolled, with neuropathy ([place])   Anemia of chronic disease, prostate cancer   Cancer associated pain   Neoplasm of prostate, distant metastasis staging category M1b: metastasis to bone ([place])   Benign essential HTN   History of present illness:  Ricky Terry is a 70 y.o. male with a past medical history of prostate cancer with metastases, cancer related pain, diabetes on insulin, who is receiving chemotherapy for his cancer. His last chemotherapy was on February 22, when he received Taxotere. He received Neulasta infusion on February 24. Patient presented with onset of shortness of breath with exertion, abdominal pain, nausea. He was given Zofran by EMS and his symptoms started subsiding. He denies any chest pain. He was having abdominal pain on and off, which is chronic. Currently pain is improved. His oncologist recently switched him over from OxyContin to fentanyl patch however, patient has not been able to fill this medication quite yet. He was admitted for Hb of 6.1 and need for a blood transfusion.  Hospital Course:  Symptomatic anemia - has received 2 U of blood without complications and states he feels ready to go home this AM - Hb improved from 6.1 to 8.9  Chronic abdominal pain - resolved for now - Xray in ER was unrevealing - tolerating diet- no vomiting or diarrhea     Discharge Exam: Filed Weights   10/14/15 1730  Weight: 53.7 kg (118 lb 6.2 oz)   Filed Vitals:   10/15/15 0108 10/15/15 0418  BP: 153/69 158/62  Pulse: 100 102  Temp: 98.5 F (36.9 C) 98.5 F (36.9 C)  Resp: 14 16     General: AAO x 3, no distress Cardiovascular: RRR, no murmurs  Respiratory: clear to auscultation bilaterally GI: soft, non-tender, non-distended, bowel sound positive  Discharge Instructions You were cared for by a hospitalist during your hospital stay. If you have any questions about your discharge medications or the care you received while you were in the hospital after you are discharged, you can call the unit and asked to speak with the hospitalist on call if the hospitalist that took care of you is not available. Once you are discharged, your primary care physician will handle any further medical issues. Please note that NO REFILLS for any discharge medications will be authorized once you are discharged, as it is imperative that you return to your primary care physician (or establish a relationship with a primary care physician if you do not have one) for your aftercare needs so that they can reassess your need for medications and monitor your lab values.  Discharge Instructions    Discharge instructions    Complete by:  As directed   Diabetic low salt heart healthy diet     Increase activity slowly    Complete by:  As directed             Medication List    TAKE these medications        aspirin 81 MG chewable tablet  Chew 1 tablet (81 mg total) by mouth daily.     atorvastatin 40 MG tablet  Commonly known as:  LIPITOR  Take  1 tablet (40 mg total) by mouth at bedtime.     bisacodyl 10 MG suppository  Commonly known as:  DULCOLAX  Place 1 suppository (10 mg total) rectally daily.     cholecalciferol 400 units Tabs tablet  Commonly known as:  VITAMIN D  Take 400 Units by mouth daily.     clopidogrel 75 MG tablet  Commonly known as:  PLAVIX  Take 1 tablet (75 mg total) by mouth daily with breakfast.     COMBIGAN 0.2-0.5 % ophthalmic solution  Generic drug:  brimonidine-timolol  Place 1 drop into both eyes every 12 (twelve) hours.     dexamethasone 4 MG tablet   Commonly known as:  DECADRON  Take 1 tablet (4 mg total) by mouth 3 (three) times daily.     dorzolamide 2 % ophthalmic solution  Commonly known as:  TRUSOPT  Place into both eyes 2 (two) times daily.     feeding supplement (ENSURE ENLIVE) Liqd  Take 237 mLs by mouth 2 (two) times daily between meals.     fentaNYL 25 MCG/HR patch  Commonly known as:  DURAGESIC  Place 1 patch (25 mcg total) onto the skin every 3 (three) days.     furosemide 20 MG tablet  Commonly known as:  LASIX  Take 1 tablet (20 mg total) by mouth daily.     glipiZIDE 10 MG tablet  Commonly known as:  GLUCOTROL  Take 1 tablet (10 mg total) by mouth daily before breakfast.     insulin aspart 100 UNIT/ML injection  Commonly known as:  novoLOG  Inject 0-20 Units into the skin 3 (three) times daily with meals.     Insulin Detemir 100 UNIT/ML Pen  Commonly known as:  LEVEMIR  Inject 15 Units into the skin daily.     iron polysaccharides 150 MG capsule  Commonly known as:  NIFEREX  Take 1 capsule (150 mg total) by mouth daily.     lidocaine-prilocaine cream  Commonly known as:  EMLA  Apply 1 application topically as needed.     losartan 50 MG tablet  Commonly known as:  COZAAR  Take 1 tablet (50 mg total) by mouth daily.     LUMIGAN 0.01 % Soln  Generic drug:  bimatoprost  Place 1 drop into both eyes at bedtime.     megestrol 400 MG/10ML suspension  Commonly known as:  MEGACE  Take 10 mLs (400 mg total) by mouth 2 (two) times daily.     metFORMIN 1000 MG tablet  Commonly known as:  GLUCOPHAGE  Take 1 tablet (1,000 mg total) by mouth 2 (two) times daily.     oxyCODONE 5 MG immediate release tablet  Commonly known as:  Oxy IR/ROXICODONE  Take 1 tablet (5 mg total) by mouth every 4 (four) hours as needed for moderate pain.     prochlorperazine 10 MG tablet  Commonly known as:  COMPAZINE  Take 1 tablet (10 mg total) by mouth every 6 (six) hours as needed for nausea or vomiting.      senna-docusate 8.6-50 MG tablet  Commonly known as:  Senokot-S  Take 1 tablet by mouth 2 (two) times daily.     traZODone 50 MG tablet  Commonly known as:  DESYREL  Take 0.5 tablets (25 mg total) by mouth at bedtime as needed for sleep.       No Known Allergies     Follow-up Information    Follow up with [name],GEORGE, MD.   Specialty:  Internal Medicine   Why:  f/u with PCP or cancer doctor and have CBC in 3-4 days   Contact information:   [place] [place] [id number] [place] [id number] [phone]        The results of significant diagnostics from this hospitalization (including imaging, microbiology, ancillary and laboratory) are listed below for reference.    Significant Diagnostic Studies: Ct Abdomen Pelvis W Contrast  10/08/2015  CLINICAL DATA:  Pt c/o abd pain naval to R side x 1 week, denies n/v/d. CA pt, last chemo 3 weeks ago, next tx Wednesday. Hx of diabetes, HTN, stroke and prostate ca with bone mets EXAM: CT ABDOMEN AND PELVIS WITH CONTRAST TECHNIQUE: Multidetector CT imaging of the abdomen and pelvis was performed using the standard protocol following bolus administration of intravenous contrast. CONTRAST:  150mL OMNIPAQUE IOHEXOL 300 MG/ML SOLN, 64mL OMNIPAQUE IOHEXOL 300 MG/ML SOLN COMPARISON:  04/10/2015 FINDINGS: Lung bases:  Clear.  Heart normal size. Liver, spleen, gallbladder, pancreas, adrenal glands:  Normal. Kidneys, ureters, bladder: Mild prominence of the intrarenal collecting systems and ureters. No renal or ureteral stones. Symmetric renal enhancement and excretion. Mild wall thickening of the bladder. Prostate gland: Enlarged and heterogeneous. Prostate bulges against the bladder base but there is no evidence of bladder invasion. Lymph nodes:  No adenopathy. Ascites:  None. Vascular: Atherosclerotic calcifications along the abdominal aorta and iliac arteries. Gastrointestinal:  Unremarkable.  Normal appendix visualized. Musculoskeletal: No  significant increase in bony metastatic disease throughout the axial skeleton. The pathologic compression fracture of L4 has increased in severity. IMPRESSION: 1. No acute findings within the abdomen pelvis. 2. Prostatic enlargement, similar to the prior exam. Bladder wall thickening is new, mild in severity. No evidence of prostate carcinoma invading the bladder. The mild prominence of the intrarenal collecting systems and ureters bilaterally is similar to the prior study. 3. There has been significant worsening of metastatic disease to bone. A pathologic compression fracture of L4 has increased in severity. Electronically Signed   By: Lajean Manes M.D.   On: 10/08/2015 09:36   Dg Abd Acute W/chest  10/14/2015  CLINICAL DATA:  Shortness of breath and abdominal pain EXAM: DG ABDOMEN ACUTE W/ 1V CHEST COMPARISON:  None. FINDINGS: Cardiac shadow is within normal limits. The lungs are well aerated bilaterally. A right chest wall port is seen in satisfactory position. No focal infiltrate is seen. Scattered large and small bowel gas is noted. No free air is noted. Diffuse sclerotic foci are noted throughout the bony structures consistent with the given clinical history of metastatic prostate carcinoma. No obstructive changes are seen. IMPRESSION: Changes of metastatic disease to the bony structures. No acute abnormality is noted. Electronically Signed   By: Inez Catalina M.D.   On: 10/14/2015 15:55    Microbiology: No results found for this or any previous visit (from the past 240 hour(s)).   Labs: Basic Metabolic Panel:  Recent Labs Lab 10/11/15 0918 10/14/15 1447 10/15/15 0555  NA 138 138 136  K 4.2 4.0 4.2  CL  --  103 103  CO2 28 26 23   GLUCOSE 280* 282* 289*  BUN 10.5 12 11   CREATININE 0.7 0.49* 0.47*  CALCIUM 9.7 9.4 8.8*   Liver Function Tests:  Recent Labs Lab 10/11/15 0918 10/14/15 1447 10/15/15 0555  AST 45* 24 27  ALT 21 19 17   ALKPHOS 756* 474* 503*  BILITOT 0.64 0.6 1.2   PROT 6.5 6.0* 5.8*  ALBUMIN 3.7 3.6 3.6    Recent  Labs Lab 10/14/15 1447  LIPASE 28   No results for input(s): AMMONIA in the last 168 hours. CBC:  Recent Labs Lab 10/11/15 0918 10/14/15 1447 10/15/15 0555  WBC 8.3 22.9* 35.5*  NEUTROABS 7.3* 22.0*  --   HGB 7.5* 6.1* 8.9*  HCT 23.5* 18.2* 27.0*  MCV 87.3 89.7 89.4  PLT 73* 46* 52*   Cardiac Enzymes: No results for input(s): CKTOTAL, CKMB, CKMBINDEX, TROPONINI in the last 168 hours. BNP: BNP (last 3 results) No results for input(s): BNP in the last 8760 hours.  ProBNP (last 3 results) No results for input(s): PROBNP in the last 8760 hours.  CBG:  Recent Labs Lab 10/14/15 1353 10/14/15 1841 10/14/15 2151 10/15/15 0729  GLUCAP 326* 137* 112* 229*       SignedDebbe Odea, MD [facility] 10/15/2015, 9:15 AM

## 2015-10-16 ENCOUNTER — Encounter: Payer: Self-pay | Admitting: Oncology

## 2015-10-16 ENCOUNTER — Emergency Department (HOSPITAL_COMMUNITY): Payer: PPO

## 2015-10-16 LAB — COMPREHENSIVE METABOLIC PANEL
ALK PHOS: 505 U/L — AB (ref 38–126)
ALT: 14 U/L — ABNORMAL LOW (ref 17–63)
ANION GAP: 11 (ref 5–15)
AST: 20 U/L (ref 15–41)
Albumin: 3.7 g/dL (ref 3.5–5.0)
BILIRUBIN TOTAL: 1.6 mg/dL — AB (ref 0.3–1.2)
BUN: 18 mg/dL (ref 6–20)
CALCIUM: 9.3 mg/dL (ref 8.9–10.3)
CO2: 23 mmol/L (ref 22–32)
Chloride: 103 mmol/L (ref 101–111)
Creatinine, Ser: 0.56 mg/dL — ABNORMAL LOW (ref 0.61–1.24)
GFR calc non Af Amer: >60 mL/min (ref >60)
GLUCOSE: 379 mg/dL — AB (ref 65–99)
Potassium: 4 mmol/L (ref 3.5–5.1)
Sodium: 137 mmol/L (ref 135–145)
TOTAL PROTEIN: 5.9 g/dL — AB (ref 6.5–8.1)

## 2015-10-16 LAB — TYPE AND SCREEN
ABO/RH(D): A POS
Antibody Screen: NEGATIVE
Unit division: 0
Unit division: 0

## 2015-10-16 LAB — CBC WITH DIFFERENTIAL/PLATELET
Basophils Absolute: 0 10*3/uL (ref 0.0–0.1)
Basophils Relative: 0 %
EOS PCT: 0 %
Eosinophils Absolute: 0 10*3/uL (ref 0.0–0.7)
HEMATOCRIT: 24.6 % — AB (ref 39.0–52.0)
HEMOGLOBIN: 8 g/dL — AB (ref 13.0–17.0)
LYMPHS ABS: 1 10*3/uL (ref 0.7–4.0)
Lymphocytes Relative: 4 %
MCH: 29.3 pg (ref 26.0–34.0)
MCHC: 32.5 g/dL (ref 30.0–36.0)
MCV: 90.1 fL (ref 78.0–100.0)
MONOS PCT: 4 %
Monocytes Absolute: 1 10*3/uL (ref 0.1–1.0)
NEUTROS ABS: 23.6 10*3/uL — AB (ref 1.7–7.7)
Neutrophils Relative %: 92 %
Platelets: 59 10*3/uL — ABNORMAL LOW (ref 150–400)
RBC: 2.73 MIL/uL — ABNORMAL LOW (ref 4.22–5.81)
RDW: 16.3 % — ABNORMAL HIGH (ref 11.5–15.5)
WBC: 25.6 10*3/uL — ABNORMAL HIGH (ref 4.0–10.5)

## 2015-10-16 MED ORDER — HYDROMORPHONE HCL 1 MG/ML IJ SOLN
0.5000 mg | INTRAMUSCULAR | Status: DC | PRN
  Administered 2015-10-16: 0.5 mg via INTRAVENOUS
  Filled 2015-10-16: qty 1

## 2015-10-16 MED ORDER — OXYCODONE HCL 5 MG PO TABS: 5.0000 mg | ORAL_TABLET | ORAL | Status: AC | PRN

## 2015-10-16 MED ORDER — FENTANYL CITRATE (PF) 100 MCG/2ML IJ SOLN
50.0000 ug | INTRAMUSCULAR | Status: DC | PRN
  Administered 2015-10-16: 50 ug via INTRAVENOUS
  Filled 2015-10-16: qty 2

## 2015-10-16 MED ORDER — SODIUM CHLORIDE 0.9 % IV SOLN
INTRAVENOUS | Status: DC
  Administered 2015-10-16: 05:00:00 via INTRAVENOUS

## 2015-10-16 MED ORDER — HEPARIN SOD (PORK) LOCK FLUSH 100 UNIT/ML IV SOLN
500.0000 [IU] | Freq: Once | INTRAVENOUS | Status: AC
  Administered 2015-10-16: 500 [IU]
  Filled 2015-10-16: qty 5

## 2015-10-16 NOTE — ED Notes (Signed)
Patient transported to X-ray 

## 2015-10-16 NOTE — ED Notes (Signed)
Pt changed into gown for comfort. Pain increased. Will notify EDP.

## 2015-10-16 NOTE — ED Notes (Signed)
Pt placing call to family member to pick him up.

## 2015-10-16 NOTE — ED Notes (Signed)
MD at bedside. 

## 2015-10-16 NOTE — Discharge Instructions (Signed)
Humerus Fracture Treated With Immobilization °The humerus is the large bone in your upper arm. You have a broken (fractured) humerus. These fractures are easily diagnosed with X-rays. °TREATMENT  °Simple fractures which will heal without disability are treated with simple immobilization. Immobilization means you will wear a cast, splint, or sling. You have a fracture which will do well with immobilization. The fracture will heal well simply by being held in a good position until it is stable enough to begin range of motion exercises. Do not take part in activities which would further injure your arm.  °HOME CARE INSTRUCTIONS  °· Put ice on the injured area. °¨ Put ice in a plastic bag. °¨ Place a towel between your skin and the bag. °¨ Leave the ice on for 15-20 minutes, 03-04 times a day. °· If you have a cast: °¨ Do not scratch the skin under the cast using sharp or pointed objects. °¨ Check the skin around the cast every day. You may put lotion on any red or sore areas. °¨ Keep your cast dry and clean. °· If you have a splint: °¨ Wear the splint as directed. °¨ Keep your splint dry and clean. °¨ You may loosen the elastic around the splint if your fingers become numb, tingle, or turn cold or blue. °· If you have a sling: °¨ Wear the sling as directed. °· Do not put pressure on any part of your cast or splint until it is fully hardened. °· Your cast or splint can be protected during bathing with a plastic bag. Do not lower the cast or splint into water. °· Only take over-the-counter or prescription medicines for pain, discomfort, or fever as directed by your caregiver. °· Do range of motion exercises as instructed by your caregiver. °· Follow up as directed by your caregiver. This is very important in order to avoid permanent injury or disability and chronic pain. °SEEK IMMEDIATE MEDICAL CARE IF:  °· Your skin or nails in the injured arm turn blue or gray. °· Your arm feels cold or numb. °· You develop severe pain  in the injured arm. °· You are having problems with the medicines you were given. °MAKE SURE YOU:  °· Understand these instructions. °· Will watch your condition. °· Will get help right away if you are not doing well or get worse. °  °This information is not intended to replace advice given to you by your health care provider. Make sure you discuss any questions you have with your health care provider. °  °Document Released: 11/11/2000 Document Revised: 08/26/2014 Document Reviewed: 12/28/2014 °Elsevier Interactive Patient Education ©2016 [employer] ° °

## 2015-10-18 ENCOUNTER — Inpatient Hospital Stay (HOSPITAL_COMMUNITY)
Admission: EM | Admit: 2015-10-18 | Discharge: 2015-11-18 | DRG: 871 | Disposition: E | Payer: PPO | Attending: Internal Medicine | Admitting: Internal Medicine

## 2015-10-18 ENCOUNTER — Other Ambulatory Visit: Payer: Self-pay

## 2015-10-18 ENCOUNTER — Encounter (HOSPITAL_COMMUNITY): Payer: Self-pay | Admitting: Emergency Medicine

## 2015-10-18 ENCOUNTER — Emergency Department (HOSPITAL_COMMUNITY): Payer: PPO

## 2015-10-18 DIAGNOSIS — R6521 Severe sepsis with septic shock: Secondary | ICD-10-CM | POA: Diagnosis present

## 2015-10-18 DIAGNOSIS — I10 Essential (primary) hypertension: Secondary | ICD-10-CM | POA: Diagnosis present

## 2015-10-18 DIAGNOSIS — E785 Hyperlipidemia, unspecified: Secondary | ICD-10-CM | POA: Diagnosis present

## 2015-10-18 DIAGNOSIS — Z66 Do not resuscitate: Secondary | ICD-10-CM | POA: Diagnosis present

## 2015-10-18 DIAGNOSIS — Z8673 Personal history of transient ischemic attack (TIA), and cerebral infarction without residual deficits: Secondary | ICD-10-CM | POA: Diagnosis not present

## 2015-10-18 DIAGNOSIS — E872 Acidosis: Secondary | ICD-10-CM | POA: Diagnosis present

## 2015-10-18 DIAGNOSIS — Y95 Nosocomial condition: Secondary | ICD-10-CM | POA: Diagnosis present

## 2015-10-18 DIAGNOSIS — C61 Malignant neoplasm of prostate: Secondary | ICD-10-CM | POA: Diagnosis present

## 2015-10-18 DIAGNOSIS — C7951 Secondary malignant neoplasm of bone: Secondary | ICD-10-CM | POA: Diagnosis present

## 2015-10-18 DIAGNOSIS — A419 Sepsis, unspecified organism: Secondary | ICD-10-CM | POA: Insufficient documentation

## 2015-10-18 DIAGNOSIS — E1165 Type 2 diabetes mellitus with hyperglycemia: Secondary | ICD-10-CM | POA: Diagnosis present

## 2015-10-18 DIAGNOSIS — Z923 Personal history of irradiation: Secondary | ICD-10-CM | POA: Diagnosis not present

## 2015-10-18 DIAGNOSIS — J9601 Acute respiratory failure with hypoxia: Secondary | ICD-10-CM | POA: Diagnosis present

## 2015-10-18 DIAGNOSIS — E43 Unspecified severe protein-calorie malnutrition: Secondary | ICD-10-CM | POA: Diagnosis present

## 2015-10-18 DIAGNOSIS — Z8249 Family history of ischemic heart disease and other diseases of the circulatory system: Secondary | ICD-10-CM

## 2015-10-18 DIAGNOSIS — Z681 Body mass index (BMI) 19 or less, adult: Secondary | ICD-10-CM

## 2015-10-18 DIAGNOSIS — R0602 Shortness of breath: Secondary | ICD-10-CM | POA: Insufficient documentation

## 2015-10-18 DIAGNOSIS — D649 Anemia, unspecified: Secondary | ICD-10-CM | POA: Insufficient documentation

## 2015-10-18 DIAGNOSIS — J9600 Acute respiratory failure, unspecified whether with hypoxia or hypercapnia: Secondary | ICD-10-CM | POA: Diagnosis present

## 2015-10-18 DIAGNOSIS — A4102 Sepsis due to Methicillin resistant Staphylococcus aureus: Principal | ICD-10-CM | POA: Diagnosis present

## 2015-10-18 DIAGNOSIS — G931 Anoxic brain damage, not elsewhere classified: Secondary | ICD-10-CM | POA: Diagnosis present

## 2015-10-18 DIAGNOSIS — Z833 Family history of diabetes mellitus: Secondary | ICD-10-CM

## 2015-10-18 DIAGNOSIS — E860 Dehydration: Secondary | ICD-10-CM | POA: Diagnosis present

## 2015-10-18 DIAGNOSIS — J189 Pneumonia, unspecified organism: Secondary | ICD-10-CM | POA: Diagnosis present

## 2015-10-18 LAB — URINALYSIS, ROUTINE W REFLEX MICROSCOPIC
BILIRUBIN URINE: NEGATIVE
Glucose, UA: 500 mg/dL — AB
Hgb urine dipstick: NEGATIVE
KETONES UR: 15 mg/dL — AB
Leukocytes, UA: NEGATIVE
NITRITE: NEGATIVE
Protein, ur: 100 mg/dL — AB
Specific Gravity, Urine: 1.019 (ref 1.005–1.030)
pH: 5 (ref 5.0–8.0)

## 2015-10-18 LAB — COMPREHENSIVE METABOLIC PANEL
ALT: 13 U/L — AB (ref 17–63)
AST: 35 U/L (ref 15–41)
Albumin: 2.7 g/dL — ABNORMAL LOW (ref 3.5–5.0)
Alkaline Phosphatase: 476 U/L — ABNORMAL HIGH (ref 38–126)
Anion gap: 20 — ABNORMAL HIGH (ref 5–15)
BUN: 19 mg/dL (ref 6–20)
CHLORIDE: 101 mmol/L (ref 101–111)
CO2: 15 mmol/L — ABNORMAL LOW (ref 22–32)
Calcium: 8.1 mg/dL — ABNORMAL LOW (ref 8.9–10.3)
Creatinine, Ser: 0.93 mg/dL (ref 0.61–1.24)
GFR calc Af Amer: >60 mL/min (ref >60)
Glucose, Bld: 381 mg/dL — ABNORMAL HIGH (ref 65–99)
Potassium: 4 mmol/L (ref 3.5–5.1)
Sodium: 136 mmol/L (ref 135–145)
Total Bilirubin: 1.2 mg/dL (ref 0.3–1.2)
Total Protein: 4.7 g/dL — ABNORMAL LOW (ref 6.5–8.1)

## 2015-10-18 LAB — I-STAT ARTERIAL BLOOD GAS, ED
Acid-base deficit: 10 mmol/L — ABNORMAL HIGH (ref 0.0–2.0)
BICARBONATE: 15.3 meq/L — AB (ref 20.0–24.0)
O2 Saturation: 96 %
TCO2: 16 mmol/L (ref 0–100)
pCO2 arterial: 31.2 mmHg — ABNORMAL LOW (ref 35.0–45.0)
pH, Arterial: 7.299 — ABNORMAL LOW (ref 7.350–7.450)
pO2, Arterial: 87 mmHg (ref 80.0–100.0)

## 2015-10-18 LAB — CBC WITH DIFFERENTIAL/PLATELET
Basophils Absolute: 0 10*3/uL (ref 0.0–0.1)
Basophils Relative: 0 %
EOS PCT: 1 %
Eosinophils Absolute: 0.1 10*3/uL (ref 0.0–0.7)
HEMATOCRIT: 17.2 % — AB (ref 39.0–52.0)
Hemoglobin: 5.7 g/dL — CL (ref 13.0–17.0)
LYMPHS ABS: 1.1 10*3/uL (ref 0.7–4.0)
Lymphocytes Relative: 8 %
MCH: 29.2 pg (ref 26.0–34.0)
MCHC: 33.1 g/dL (ref 30.0–36.0)
MCV: 88.2 fL (ref 78.0–100.0)
MONOS PCT: 12 %
Monocytes Absolute: 1.7 10*3/uL — ABNORMAL HIGH (ref 0.1–1.0)
NEUTROS PCT: 79 %
Neutro Abs: 11.2 10*3/uL — ABNORMAL HIGH (ref 1.7–7.7)
Platelets: 77 10*3/uL — ABNORMAL LOW (ref 150–400)
RBC: 1.95 MIL/uL — AB (ref 4.22–5.81)
RDW: 15.7 % — ABNORMAL HIGH (ref 11.5–15.5)
WBC: 14.1 10*3/uL — AB (ref 4.0–10.5)

## 2015-10-18 LAB — I-STAT CG4 LACTIC ACID, ED
LACTIC ACID, VENOUS: 5.59 mmol/L — AB (ref 0.5–2.0)
Lactic Acid, Venous: 9.17 mmol/L (ref 0.5–2.0)

## 2015-10-18 LAB — URINE MICROSCOPIC-ADD ON

## 2015-10-18 LAB — MRSA PCR SCREENING: MRSA by PCR: POSITIVE — AB

## 2015-10-18 LAB — CBG MONITORING, ED: Glucose-Capillary: 333 mg/dL — ABNORMAL HIGH (ref 65–99)

## 2015-10-18 LAB — PREPARE RBC (CROSSMATCH)

## 2015-10-18 LAB — I-STAT TROPONIN, ED: TROPONIN I, POC: 0.02 ng/mL (ref 0.00–0.08)

## 2015-10-18 LAB — GLUCOSE, CAPILLARY
GLUCOSE-CAPILLARY: 384 mg/dL — AB (ref 65–99)
Glucose-Capillary: 390 mg/dL — ABNORMAL HIGH (ref 65–99)

## 2015-10-18 MED ORDER — NOREPINEPHRINE BITARTRATE 1 MG/ML IV SOLN
0.0000 ug/min | INTRAVENOUS | Status: DC
  Administered 2015-10-18: 2 ug/min via INTRAVENOUS
  Filled 2015-10-18: qty 4

## 2015-10-18 MED ORDER — NOREPINEPHRINE BITARTRATE 1 MG/ML IV SOLN
0.0000 ug/min | INTRAVENOUS | Status: DC
  Administered 2015-10-18: 60 ug/min via INTRAVENOUS
  Administered 2015-10-19: 80 ug/min via INTRAVENOUS
  Filled 2015-10-18 (×3): qty 16

## 2015-10-18 MED ORDER — MIDAZOLAM HCL 2 MG/2ML IJ SOLN: 1.0000 mg | INTRAMUSCULAR | Status: DC | PRN

## 2015-10-18 MED ORDER — SODIUM CHLORIDE 0.9 % IV SOLN
200.0000 mg | Freq: Once | INTRAVENOUS | Status: AC
  Administered 2015-10-18: 200 mg via INTRAVENOUS
  Filled 2015-10-18: qty 200

## 2015-10-18 MED ORDER — ETOMIDATE 2 MG/ML IV SOLN
15.0000 mg | Freq: Once | INTRAVENOUS | Status: AC
  Administered 2015-10-18: 15 mg via INTRAVENOUS

## 2015-10-18 MED ORDER — NOREPINEPHRINE BITARTRATE 1 MG/ML IV SOLN
2.0000 ug/min | INTRAVENOUS | Status: DC
  Administered 2015-10-18: 4 ug/min via INTRAVENOUS
  Filled 2015-10-18 (×2): qty 4

## 2015-10-18 MED ORDER — SODIUM CHLORIDE 0.9 % IV SOLN
100.0000 mg | INTRAVENOUS | Status: DC
  Filled 2015-10-18: qty 100

## 2015-10-18 MED ORDER — VANCOMYCIN HCL 500 MG IV SOLR
500.0000 mg | Freq: Two times a day (BID) | INTRAVENOUS | Status: DC
  Administered 2015-10-19: 500 mg via INTRAVENOUS
  Filled 2015-10-18 (×2): qty 500

## 2015-10-18 MED ORDER — SODIUM CHLORIDE 0.9 % IV SOLN: 250.0000 mL | INTRAVENOUS | Status: DC | PRN

## 2015-10-18 MED ORDER — PANTOPRAZOLE SODIUM 40 MG IV SOLR: 40.0000 mg | INTRAVENOUS | Status: DC

## 2015-10-18 MED ORDER — SODIUM CHLORIDE 0.9 % IV BOLUS (SEPSIS)
1000.0000 mL | Freq: Once | INTRAVENOUS | Status: AC
  Administered 2015-10-18: 1000 mL via INTRAVENOUS

## 2015-10-18 MED ORDER — INSULIN ASPART 100 UNIT/ML ~~LOC~~ SOLN
2.0000 [IU] | SUBCUTANEOUS | Status: DC
  Administered 2015-10-18 – 2015-10-19 (×2): 6 [IU] via SUBCUTANEOUS

## 2015-10-18 MED ORDER — MIDAZOLAM HCL 2 MG/2ML IJ SOLN
INTRAMUSCULAR | Status: AC
  Administered 2015-10-18: 2 mg via INTRAVENOUS
  Filled 2015-10-18: qty 2

## 2015-10-18 MED ORDER — ACETAMINOPHEN 160 MG/5ML PO SOLN
650.0000 mg | ORAL | Status: DC | PRN
  Administered 2015-10-18: 650 mg
  Filled 2015-10-18: qty 20.3

## 2015-10-18 MED ORDER — SODIUM CHLORIDE 0.9 % IV BOLUS (SEPSIS)
1000.0000 mL | INTRAVENOUS | Status: AC
  Administered 2015-10-18 (×2): 1000 mL via INTRAVENOUS

## 2015-10-18 MED ORDER — NOREPINEPHRINE BITARTRATE 1 MG/ML IV SOLN
0.0000 ug/min | INTRAVENOUS | Status: AC
  Administered 2015-10-18: 30 ug/min via INTRAVENOUS
  Filled 2015-10-18: qty 16

## 2015-10-18 MED ORDER — VANCOMYCIN HCL IN DEXTROSE 1-5 GM/200ML-% IV SOLN
1000.0000 mg | Freq: Once | INTRAVENOUS | Status: AC
  Administered 2015-10-18: 1000 mg via INTRAVENOUS
  Filled 2015-10-18: qty 200

## 2015-10-18 MED ORDER — HEPARIN SODIUM (PORCINE) 5000 UNIT/ML IJ SOLN: 5000.0000 [IU] | Freq: Three times a day (TID) | INTRAMUSCULAR | Status: DC

## 2015-10-18 MED ORDER — DEXTROSE 5 % IV SOLN
1.0000 g | Freq: Three times a day (TID) | INTRAVENOUS | Status: DC
  Administered 2015-10-18: 1 g via INTRAVENOUS
  Filled 2015-10-18 (×4): qty 1

## 2015-10-18 MED ORDER — SODIUM CHLORIDE 0.9 % IV SOLN
25.0000 ug/h | INTRAVENOUS | Status: DC
  Administered 2015-10-18: 50 ug/h via INTRAVENOUS
  Filled 2015-10-18 (×3): qty 50

## 2015-10-18 MED ORDER — MIDAZOLAM HCL 2 MG/2ML IJ SOLN
1.0000 mg | INTRAMUSCULAR | Status: DC | PRN
  Administered 2015-10-18 (×2): 1 mg via INTRAVENOUS
  Filled 2015-10-18 (×2): qty 2

## 2015-10-18 MED ORDER — SODIUM CHLORIDE 0.9 % IV SOLN
Freq: Once | INTRAVENOUS | Status: AC
  Administered 2015-10-18: 15:00:00 via INTRAVENOUS

## 2015-10-18 MED ORDER — DEXTROSE 5 % IV SOLN
2.0000 g | Freq: Once | INTRAVENOUS | Status: AC
  Administered 2015-10-18: 2 g via INTRAVENOUS
  Filled 2015-10-18: qty 2

## 2015-10-18 MED ORDER — MIDAZOLAM HCL 2 MG/2ML IJ SOLN
2.0000 mg | Freq: Once | INTRAMUSCULAR | Status: AC
  Administered 2015-10-18: 2 mg via INTRAVENOUS

## 2015-10-18 NOTE — ED Notes (Signed)
Patient wife called husband stated can talk to wife on phone regarding patient condition.

## 2015-10-18 NOTE — ED Notes (Signed)
Nurse attempted IV unable to gain access while waiting for IV team.

## 2015-10-18 NOTE — ED Notes (Signed)
Patient arrived to patient's house after a family friend called EMS after checking on patient. EMS reported patient in bed agonal breathing 4-6 RR 50% oximetry GCS 3. BVM assisted breathing pulse oximetry increased low 90's assisted ventilation 24 mental status improved GCS 9.  Upon arrival patient alert answering questions.

## 2015-10-18 NOTE — ED Notes (Signed)
Family members at bedside holding hands comforting patient.

## 2015-10-18 NOTE — ED Notes (Signed)
Patient starting to move head side to side and move right hand to ET tube.

## 2015-10-18 NOTE — Progress Notes (Signed)
[facility] Progress Note Patient Name: Ricky Terry DOB: 1946/03/31 MRN: [id number]   Date of Service  10/31/2015  HPI/Events of Note  Progressive hypoxemia   eICU Interventions  PEEP increased. Will follow SpO2, consider ABG as we go forward     Intervention Category Major Interventions: Hypoxemia - evaluation and management  Brent Taillon S. 11/05/2015, 7:51 PM

## 2015-10-18 NOTE — ED Notes (Signed)
Critical Care talking with family member regarding plan of care.

## 2015-10-18 NOTE — Progress Notes (Signed)
   11/02/2015 1631  Clinical Encounter Type  Visited With Patient and family together;Health care provider  Visit Type Initial;Critical Care;ED  Referral From Nurse   Chaplain responded to a request to assist with patient's emotional family. Chaplain offered patient's family support and extended hospitality with tissues and drinks. Patient's pastor is here currently, and the family has grown to 53 or so. Family has transitioned into sub-waiting. Chaplain will encourage on-coming on-call chaplain to check in with family. Spiritual care services available as needed.   Jeri Lager, Chaplain 10/18/2015 4:33 PM

## 2015-10-18 NOTE — ED Notes (Signed)
Applied warm blankets on patient and increased temperature in room.

## 2015-10-18 NOTE — Progress Notes (Signed)
eLink Physician-Brief Progress Note Patient Name: Ricky Terry DOB: 1946-08-10 MRN: [id number]   Date of Service  10/25/2015  HPI/Events of Note  Case reviewed with Dr Titus Mould earlier. Need to clarify that Mr Ricky Terry is his friend Ricky Terry.  Initially, care instructions were being forwarded by pt's daughter and brother as well.  Ricky Terry has documentation that she is the [place].   eICU Interventions       Intervention Category Intermediate Interventions: Other:  Abron Neddo S. 11/10/2015, 9:58 PM

## 2015-10-18 NOTE — ED Notes (Signed)
Spoke with pharmacy will send medication secure tube station

## 2015-10-18 NOTE — ED Notes (Signed)
Patient attempted to pull on ET tube with right upper extremity

## 2015-10-18 NOTE — ED Notes (Signed)
Nurse returned with patient from CT notified Critical Care of patient condition.

## 2015-10-18 NOTE — ED Notes (Signed)
Care link nurse called asked about second lactic acid explained phlebotomy states patient receiving blood transfusion and unable to draw blood.  Care link instructed to draw blood even though patient receiving a blood transfusion. Phlebotomy notified.

## 2015-10-18 NOTE — ED Notes (Signed)
Family member alternating in room.

## 2015-10-18 NOTE — ED Notes (Signed)
Dentures given to Ricky Terry

## 2015-10-18 NOTE — ED Notes (Signed)
Patient alert lips read "help me" spoke with patient squeezed right hand moderate strength.

## 2015-10-18 NOTE — ED Notes (Signed)
Pt CBG, 333. Nurse was notified.

## 2015-10-18 NOTE — Procedures (Signed)
Intubation Procedure Note TORONTO TRABERT [id number] 28-Jul-1946  Procedure: Intubation Indications: Airway protection and maintenance  Procedure Details Consent: Risks of procedure as well as the alternatives and risks of each were explained to the (patient/caregiver).  Consent for procedure obtained. Time Out: Verified patient identification, verified procedure, site/side was marked, verified correct patient position, special equipment/implants available, medications/allergies/relevent history reviewed, required imaging and test results available.  Performed  Maximum sterile technique was used including hand hygiene and mask.  Miller and 3    Evaluation Hemodynamic Status: BP stable throughout; O2 sats: stable throughout Patient's Current Condition: stable Complications: No apparent complications Patient did tolerate procedure well. Chest X-ray ordered to verify placement.  CXR: pending.   Bincy S Varughese 11/11/2015  perfromed with NP Tolerated well Placed ett as family wanting to com in for visiting and considering comfort care  Lavon Paganini. Titus Mould, MD, [place] Pgr: [facility]

## 2015-10-18 NOTE — Progress Notes (Addendum)
Pharmacy Code Sepsis Protocol  Time of code sepsis page: 1408 [x]  Antibiotics delivered at 1430 []  Antibiotics administered prior to code at  (if checked, omit next 2 questions)  Were antibiotics ordered at the time of the code sepsis page? No Was it required to contact the physician? [x]  Physician not contacted []  Physician contacted to order antibiotics for code sepsis []  Physician contacted to recommend changing antibiotics  Pharmacy consulted for: vancomycin and cefepime  Anti-infectives    Start     Dose/Rate Route Frequency Ordered Stop   10/23/2015 1430  ceFEPIme (MAXIPIME) 2 g in dextrose 5 % 50 mL IVPB     2 g 100 mL/hr over 30 Minutes Intravenous  Once 11/06/2015 1422     11/10/2015 1430  vancomycin (VANCOCIN) IVPB 1000 mg/200 mL premix     1,000 mg 200 mL/hr over 60 Minutes Intravenous  Once 11/14/2015 1422          Nurse education provided: []  Minutes left to administer antibiotics to achieve 1 hour goal []  Correct order of antibiotic administration []  Antibiotic Y-site compatibilities     Anjeanette Petzold, Rande Lawman, PharmD 10/25/2015, 2:33 PM

## 2015-10-18 NOTE — ED Notes (Signed)
Doctor at bedside speaking to patient and family members at bedside.

## 2015-10-18 NOTE — ED Notes (Signed)
MD Plunkett aware abx not ordered, awaiting cxray at this time.

## 2015-10-18 NOTE — Progress Notes (Signed)
Pharmacy Antibiotic Note  Ricky Terry is a 70 y.o. male admitted on 11/15/2015 with pneumonia.  Pharmacy has been consulted for vancomycin and cefepime dosing. Pt is hypothermic, WBC is elevated at 14.1 and Scr is WNL. Lactic acid is elevated at 9.17.   Plan: - Vancomycin 1gm IV x 1 then 500mg  IV Q12H - Cefepime 2gm IV x 1 then 1gm IV Q8H - F/u renal fxn, C&S, clinical status and trough at SS  Height: 5\' 8"  (172.7 cm) Weight: 115 lb (52.164 kg) IBW/kg (Calculated) : 68.4  Temp (24hrs), Avg:96.6 F (35.9 C), Min:96.4 F (35.8 C), Max:96.8 F (36 C)   Recent Labs Lab 10/14/15 1447 10/15/15 0555 10/15/15 2331 10/30/2015 1354 10/29/2015 1401  WBC 22.9* 35.5* 25.6* 14.1*  --   CREATININE 0.49* 0.47* 0.56* 0.93  --   LATICACIDVEN  --   --   --   --  9.17*    Estimated Creatinine Clearance: 56.9 mL/min (by C-G formula based on Cr of 0.93).    No Known Allergies  Antimicrobials this admission: Vanc 3/1>> Cefepime 3/1>>  Dose adjustments this admission: N/A  Microbiology results: Pending  Thank you for allowing pharmacy to be a part of this patient's care.  Kiran Lapine, Rande Lawman 11/16/2015 2:58 PM

## 2015-10-18 NOTE — Progress Notes (Signed)
Pharmacy Antibiotic Note  Ricky Terry is a 70 y.o. male admitted on 11/17/2015 with Sepsis.  Pharmacy has been consulted for Eraxis dosing. CrCl ~80mL/min, WBC 14.1, Tmax 96.8  Plan: Eraxis 200mg  IV  x1 today Eraxis 100mg  IV Q24h Vancomycin 500mg  IV Q12h  F/U c/s, renal fxn  Height: 5\' 8"  (172.7 cm) Weight: 115 lb (52.164 kg) IBW/kg (Calculated) : 68.4  Temp (24hrs), Avg:96.5 F (35.8 C), Min:95.9 F (35.5 C), Max:97 F (36.1 C)   Recent Labs Lab 10/14/15 1447 10/15/15 0555 10/15/15 2331 10/24/2015 1354 11/16/2015 1401  WBC 22.9* 35.5* 25.6* 14.1*  --   CREATININE 0.49* 0.47* 0.56* 0.93  --   LATICACIDVEN  --   --   --   --  9.17*    Estimated Creatinine Clearance: 56.9 mL/min (by C-G formula based on Cr of 0.93).    No Known Allergies  Antimicrobials this admission: 3/1>>Eraxis>> 3/1>>Vancomycin>   Thank you for allowing pharmacy to be a part of this patient's care.  Esraa Seres C. Lennox Grumbles, PharmD Pharmacy Resident  Pager: [phone] 10/18/2015 5:22 PM
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
## 2015-10-19 ENCOUNTER — Inpatient Hospital Stay (HOSPITAL_COMMUNITY): Payer: PPO

## 2015-10-19 ENCOUNTER — Ambulatory Visit: Admission: RE | Admit: 2015-10-19 | Payer: PPO | Source: Ambulatory Visit | Admitting: Radiation Oncology

## 2015-10-19 LAB — BASIC METABOLIC PANEL
Anion gap: 14 (ref 5–15)
BUN: 23 mg/dL — ABNORMAL HIGH (ref 6–20)
CALCIUM: 7.9 mg/dL — AB (ref 8.9–10.3)
CO2: 18 mmol/L — ABNORMAL LOW (ref 22–32)
CREATININE: 1.09 mg/dL (ref 0.61–1.24)
Chloride: 108 mmol/L (ref 101–111)
GFR calc non Af Amer: >60 mL/min (ref >60)
Glucose, Bld: 359 mg/dL — ABNORMAL HIGH (ref 65–99)
Potassium: 3.7 mmol/L (ref 3.5–5.1)
SODIUM: 140 mmol/L (ref 135–145)

## 2015-10-19 LAB — TYPE AND SCREEN
ABO/RH(D): A POS
ANTIBODY SCREEN: NEGATIVE
UNIT DIVISION: 0
Unit division: 0

## 2015-10-19 LAB — CBC
HCT: 29.2 % — ABNORMAL LOW (ref 39.0–52.0)
HEMOGLOBIN: 9.9 g/dL — AB (ref 13.0–17.0)
MCH: 29.3 pg (ref 26.0–34.0)
MCHC: 33.9 g/dL (ref 30.0–36.0)
MCV: 86.4 fL (ref 78.0–100.0)
Platelets: 79 10*3/uL — ABNORMAL LOW (ref 150–400)
RBC: 3.38 MIL/uL — ABNORMAL LOW (ref 4.22–5.81)
RDW: 15.6 % — AB (ref 11.5–15.5)
WBC: 4.4 10*3/uL (ref 4.0–10.5)

## 2015-10-19 LAB — URINE CULTURE: CULTURE: NO GROWTH

## 2015-10-19 LAB — PATHOLOGIST SMEAR REVIEW

## 2015-10-19 LAB — LACTIC ACID, PLASMA: LACTIC ACID, VENOUS: 6.7 mmol/L — AB (ref 0.5–2.0)

## 2015-10-19 LAB — GLUCOSE, CAPILLARY
Glucose-Capillary: 213 mg/dL — ABNORMAL HIGH (ref 65–99)
Glucose-Capillary: 299 mg/dL — ABNORMAL HIGH (ref 65–99)

## 2015-10-19 LAB — PROCALCITONIN: Procalcitonin: 169.77 ng/mL

## 2015-10-19 MED ORDER — SODIUM CHLORIDE 0.9 % IV SOLN: INTRAVENOUS | Status: DC

## 2015-10-19 MED ORDER — INSULIN ASPART 100 UNIT/ML ~~LOC~~ SOLN
0.0000 [IU] | SUBCUTANEOUS | Status: DC
  Administered 2015-10-19: 8 [IU] via SUBCUTANEOUS

## 2015-10-19 MED ORDER — CHLORHEXIDINE GLUCONATE 0.12% ORAL RINSE (MEDLINE KIT): 15.0000 mL | Freq: Two times a day (BID) | OROMUCOSAL | Status: DC

## 2015-10-19 MED ORDER — CHLORHEXIDINE GLUCONATE CLOTH 2 % EX PADS: 6.0000 | MEDICATED_PAD | Freq: Every day | CUTANEOUS | Status: DC

## 2015-10-19 MED ORDER — MUPIROCIN 2 % EX OINT
1.0000 "application " | TOPICAL_OINTMENT | Freq: Two times a day (BID) | CUTANEOUS | Status: DC
  Administered 2015-10-19: 1 via NASAL
  Filled 2015-10-19: qty 22

## 2015-10-19 MED ORDER — IBUPROFEN 100 MG/5ML PO SUSP
600.0000 mg | Freq: Four times a day (QID) | ORAL | Status: DC | PRN
  Administered 2015-10-19: 600 mg via ORAL
  Filled 2015-10-19 (×2): qty 30

## 2015-10-19 MED ORDER — ANTISEPTIC ORAL RINSE SOLUTION (CORINZ)
7.0000 mL | Freq: Four times a day (QID) | OROMUCOSAL | Status: DC
  Administered 2015-10-19: 7 mL via OROMUCOSAL

## 2015-10-23 LAB — CULTURE, RESPIRATORY

## 2015-10-23 LAB — CULTURE, BLOOD (ROUTINE X 2)
CULTURE: NO GROWTH
CULTURE: NO GROWTH

## 2015-10-23 LAB — CULTURE, RESPIRATORY W GRAM STAIN

## 2015-10-27 ENCOUNTER — Telehealth: Payer: Self-pay

## 2015-10-27 NOTE — Telephone Encounter (Signed)
On 10/27/2015 I received a death certificate from [place] (original). The death certificate is for burial. The patient is a patient of Doctor Titus Mould. The death certificate will be taken to [facility] ([id number]) Monday am for signature. On 26-Nov-2015 I received the death certificate back from Doctor Titus Mould. I got the death certificate ready and called the funeral home to let them know the death certificate is ready for pickup and also faxed them a copy per their request.

## 2015-11-01 ENCOUNTER — Ambulatory Visit: Payer: PPO | Admitting: Oncology

## 2015-11-01 ENCOUNTER — Ambulatory Visit: Payer: PPO

## 2015-11-01 ENCOUNTER — Other Ambulatory Visit: Payer: PPO

## 2015-11-03 ENCOUNTER — Other Ambulatory Visit: Payer: Self-pay | Admitting: Nurse Practitioner

## 2015-11-03 ENCOUNTER — Ambulatory Visit: Payer: PPO

## 2015-11-17 ENCOUNTER — Encounter: Payer: Self-pay | Admitting: Oncology

## 2015-11-17 NOTE — Progress Notes (Signed)
I sent faxed form from 09/22/15 to medical records

## 2015-11-18 NOTE — Progress Notes (Signed)
Pt t/x to morgue with all lines in place- no post mortem done due to possibility of being an ME case. Ti McNeal in [place] made aware.   No belongings in patient room to be left with family  Lorre Munroe

## 2015-11-18 NOTE — Progress Notes (Signed)
Chaplain responded to help patient's family (23 persons) move to the waiting room for 44M.   Alerted RN for 44M07 that family was present and their size...followed her direction to bring 3 major decision makers to the room first for orientation to the unit.   Followed that plan and asked RN to page chaplain if further assistance is needed.  Rev. [place], [place]

## 2015-11-18 NOTE — Progress Notes (Signed)
At 0440 patient ECG asystole on monitor. Pupils fixed and dilated, no heart or breath sounds auscultated by Woodroe Chen, RN and Anselm Pancoast, RN. Family and MD notified.

## 2015-11-18 NOTE — Progress Notes (Addendum)
Chaplain responded to page post-death.  Came to unit to wait for family and give RN support.  Wife/daughter came first, very emotional at bedside.  Large family gathered...still present in hospital at 8:30 am.  Friend/POA present, has information for funeral home, gave her comfort, then got information and submitted to RN.  Will refer to incoming chaplain.  Rev. [place], [place]

## 2015-11-18 NOTE — Progress Notes (Signed)
[facility] Progress Note Patient Name: Ricky Terry DOB: Jul 24, 1946 MRN: [id number]   Date of Service  October 20, 2015  HPI/Events of Note  Worsening hyperglycemia and increasing lactic acid. Pt noted to have been made DNR, we are escalating care minimally due to pt wishes and poor prognosis.   eICU Interventions  Will change SSI to moderate dose, no intervention for lactic acidosis at this time.         Laverle Hobby 10-20-2015, 12:56 AM

## 2015-11-18 NOTE — Progress Notes (Signed)
Wasted 75 ml Fentanly down sink with [place]

## 2015-11-18 DEATH — deceased

## 2015-11-22 ENCOUNTER — Ambulatory Visit: Payer: PPO

## 2015-11-22 ENCOUNTER — Ambulatory Visit: Payer: PPO | Admitting: Oncology

## 2015-11-22 ENCOUNTER — Other Ambulatory Visit: Payer: PPO

## 2015-11-24 ENCOUNTER — Ambulatory Visit: Payer: PPO
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
## 2016-04-19 IMAGING — CR DG FEMUR 2+V*R*
4 series · 4 of 4 positions shown · non-contrast
Comparison: Bone scan 05/01/2015.  LEFT femur reported separately.

CLINICAL DATA: BILATERAL leg pain for 4-5 months. History of
prostate cancer.

EXAM:
RIGHT FEMUR 2 VIEWS

[t femur proximal ap right]
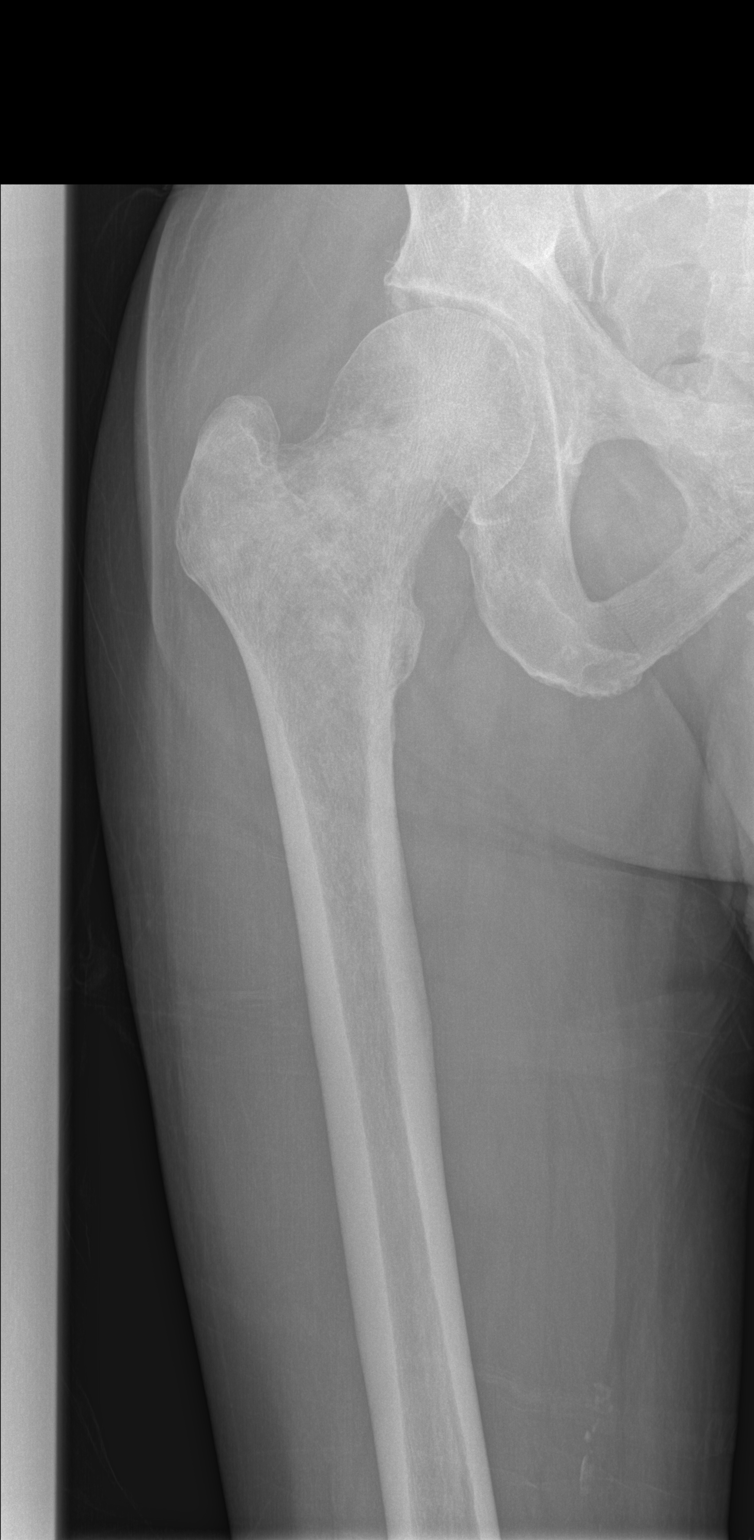

[t femur distal ap right]
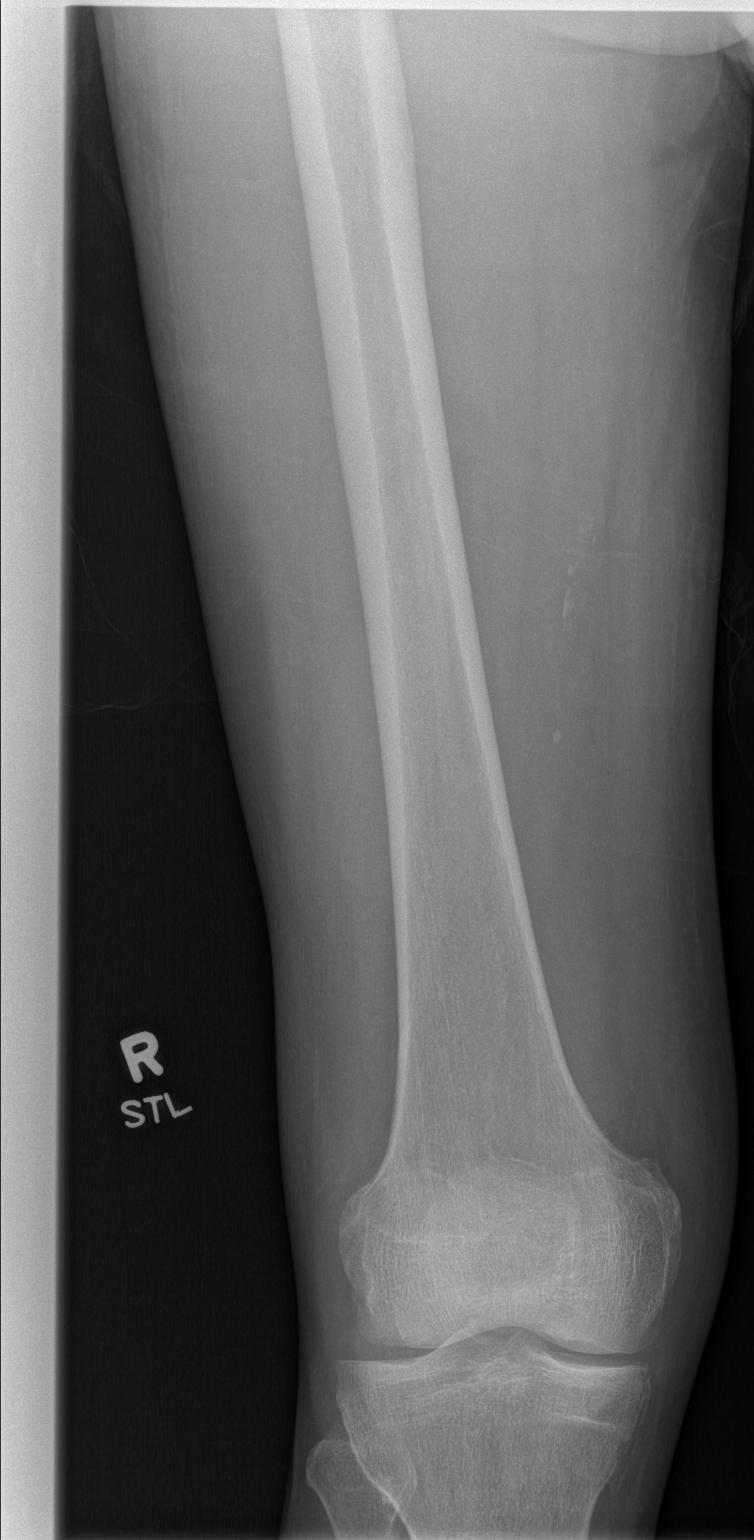

[t femur distal lat right]
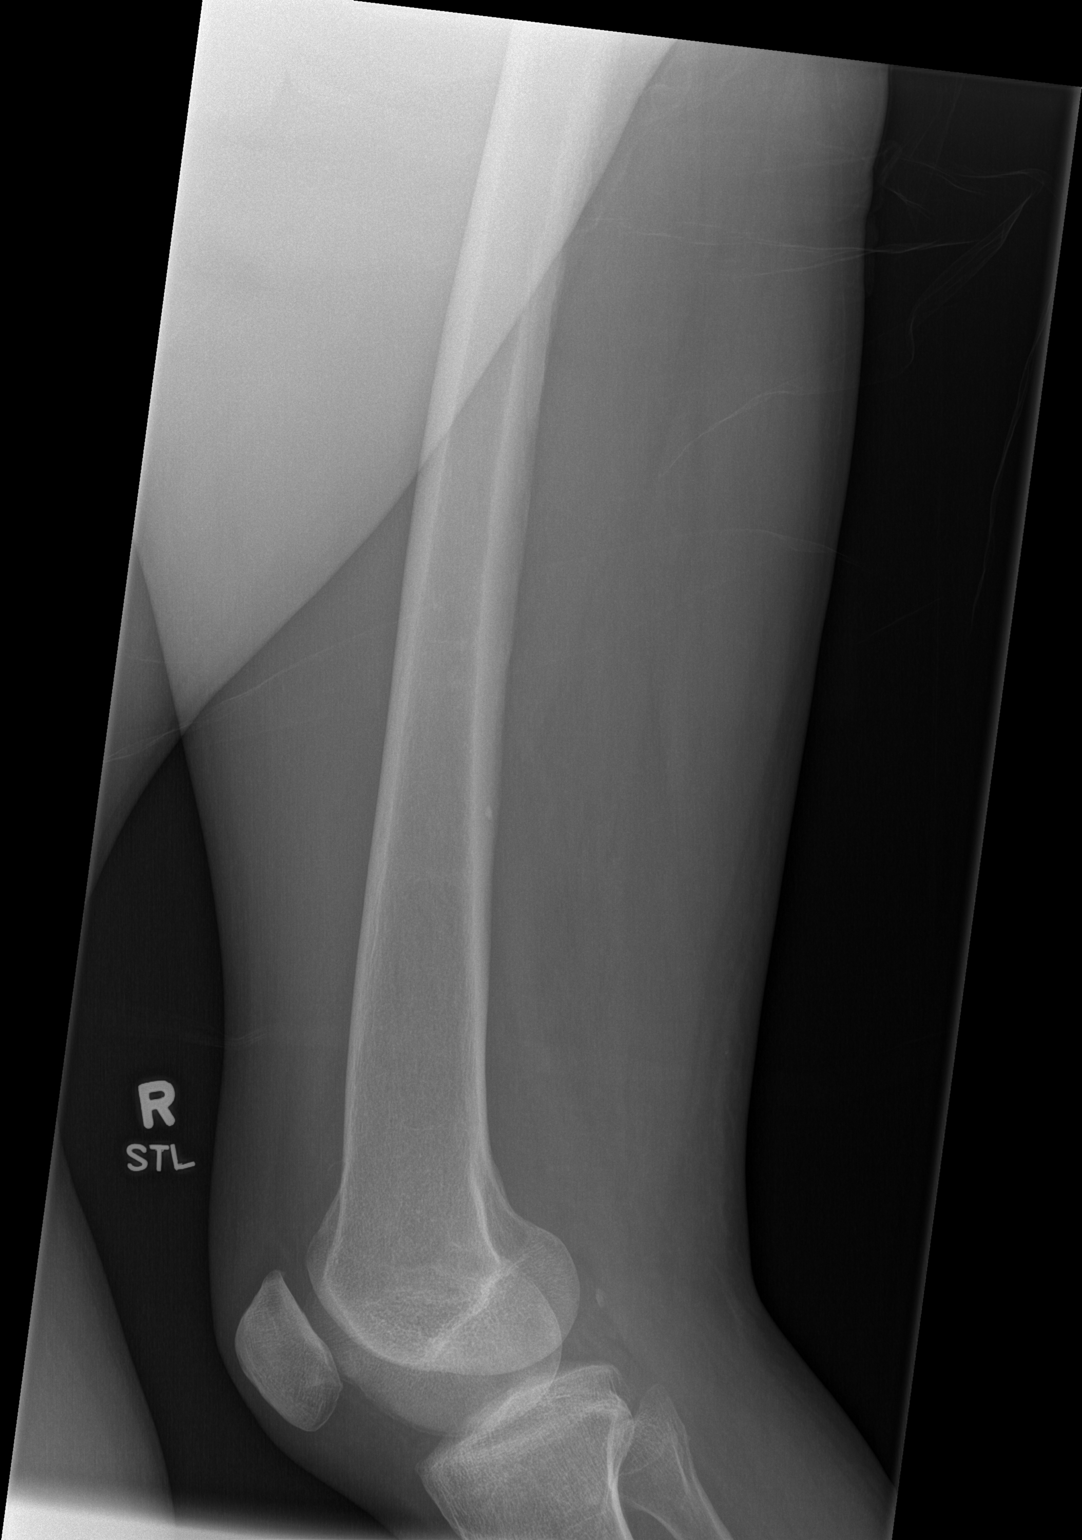

[t femur proximal lat right]
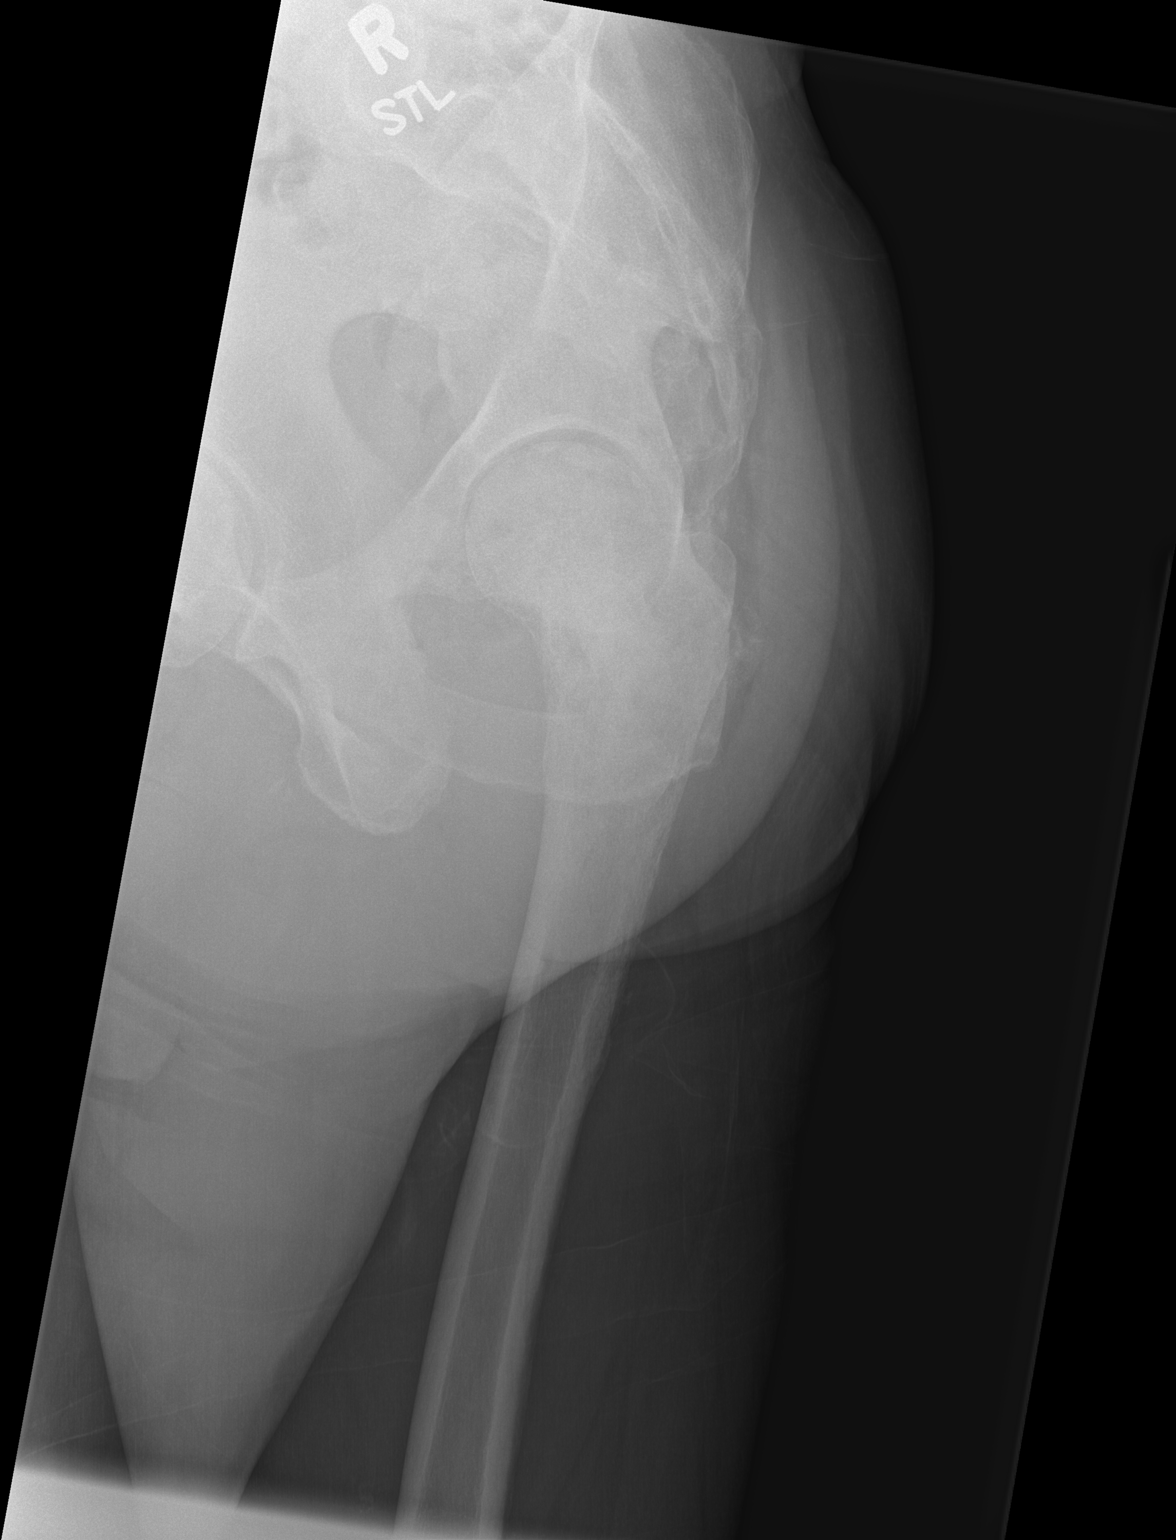

[4 of 4 positions shown; findings below may reference images not displayed]

FINDINGS: Sclerotic metastases are noted throughout the proximal RIGHT femur,
including the greater and lesser trochanter, femoral neck, and
femoral head. The adjacent pelvis is also affected in the acetabular
region, iliac waiting, and superior pubic ramus. No pathologic
fracture is evident. Vascular calcification.
IMPRESSION: Sclerotic bone metastases are observed, similar in distribution to
the prior nuclear medicine scintigraphy scan. No pathologic
fracture.

## 2016-04-19 IMAGING — CR DG FEMUR 2+V*L*
4 series · 4 of 4 positions shown · non-contrast
Comparison: Bone scan 05/01/2015.

CLINICAL DATA: Prostate cancer.  BILATERAL leg pain.

EXAM:
LEFT FEMUR 2 VIEWS

[t femur proximal ap left]
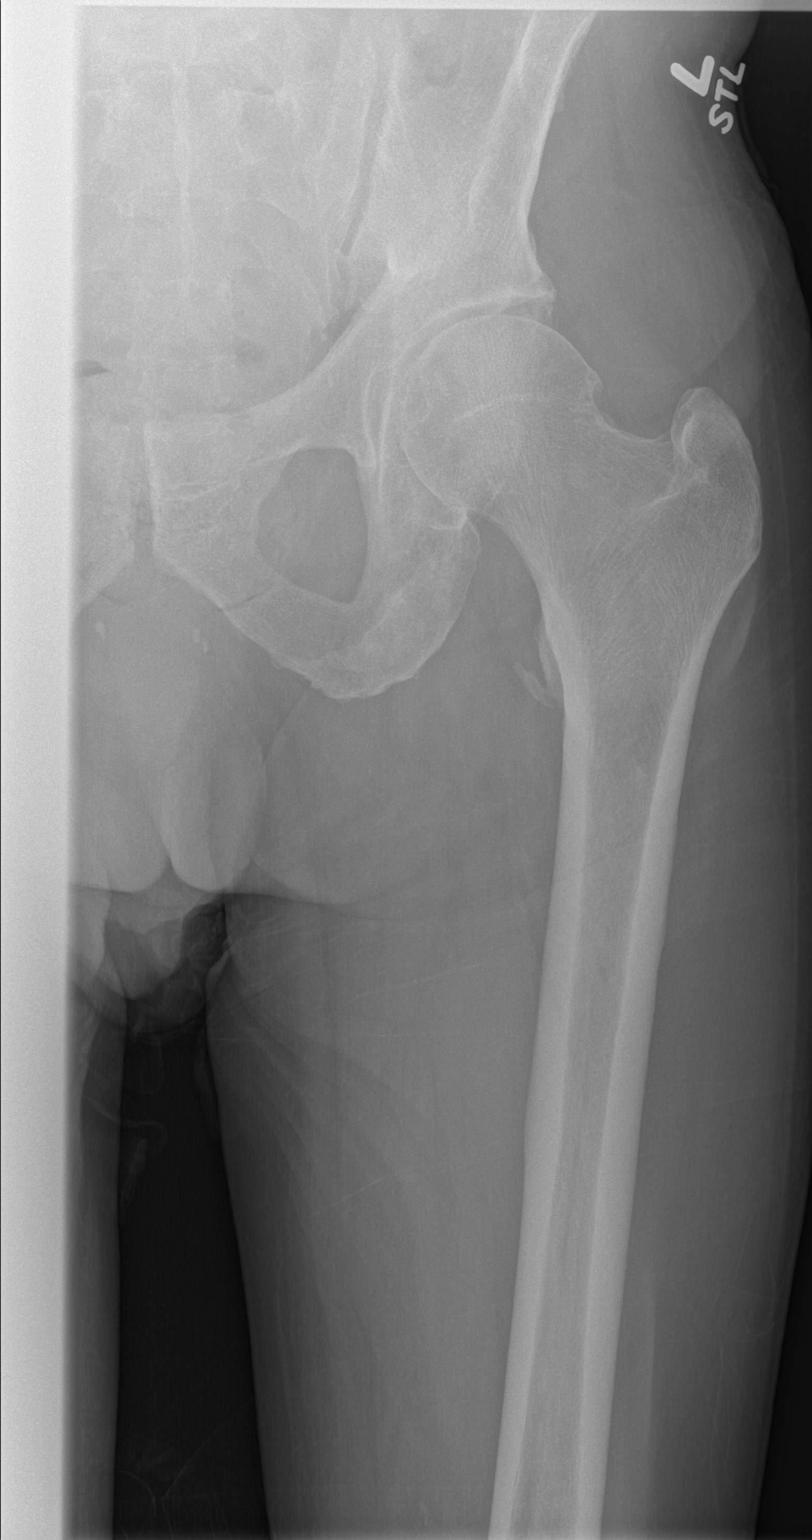

[t femur distal ap left]
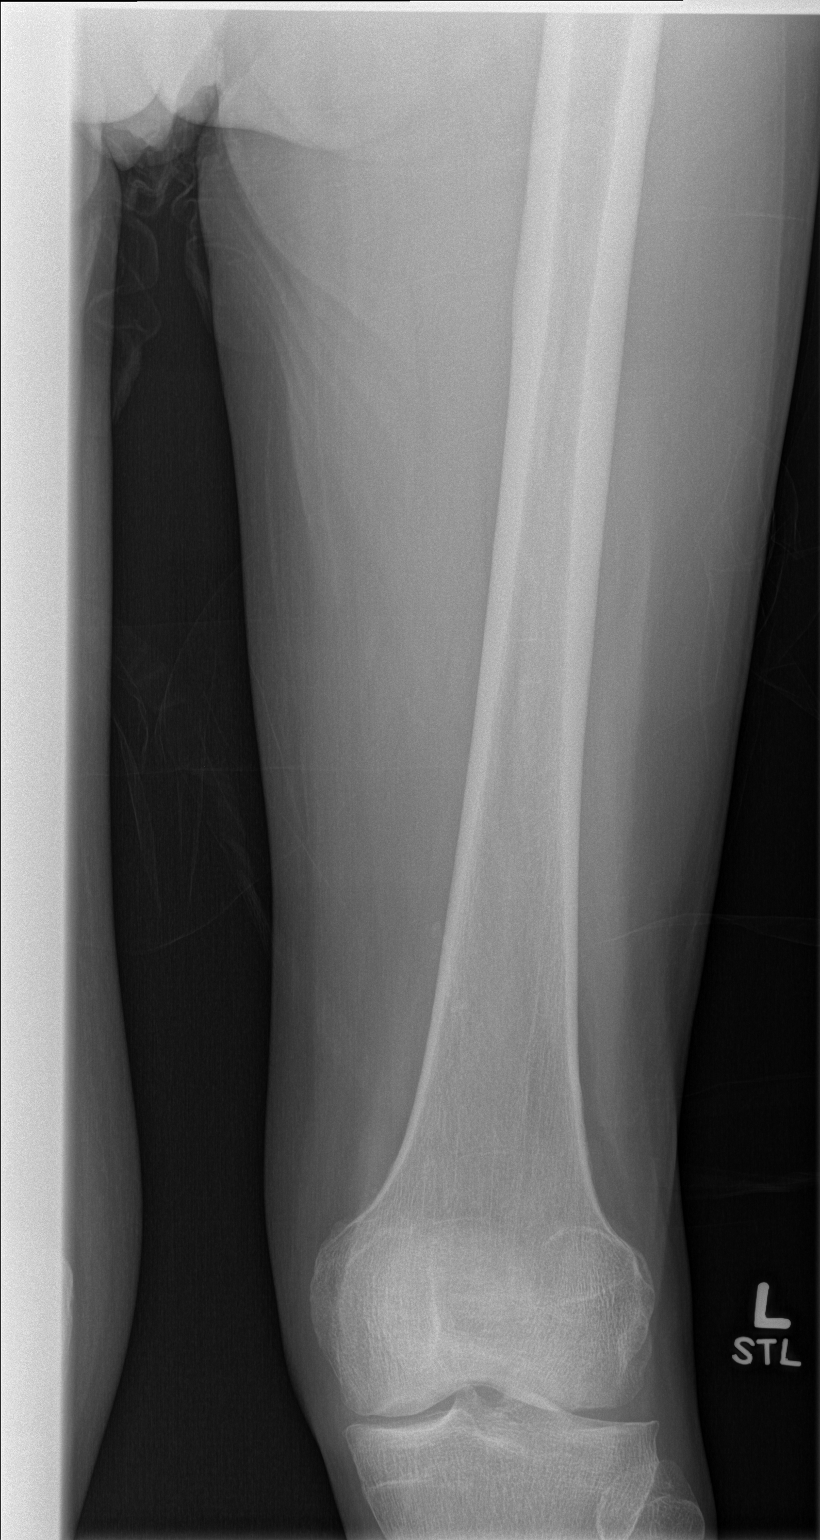

[t femur distal lat left]
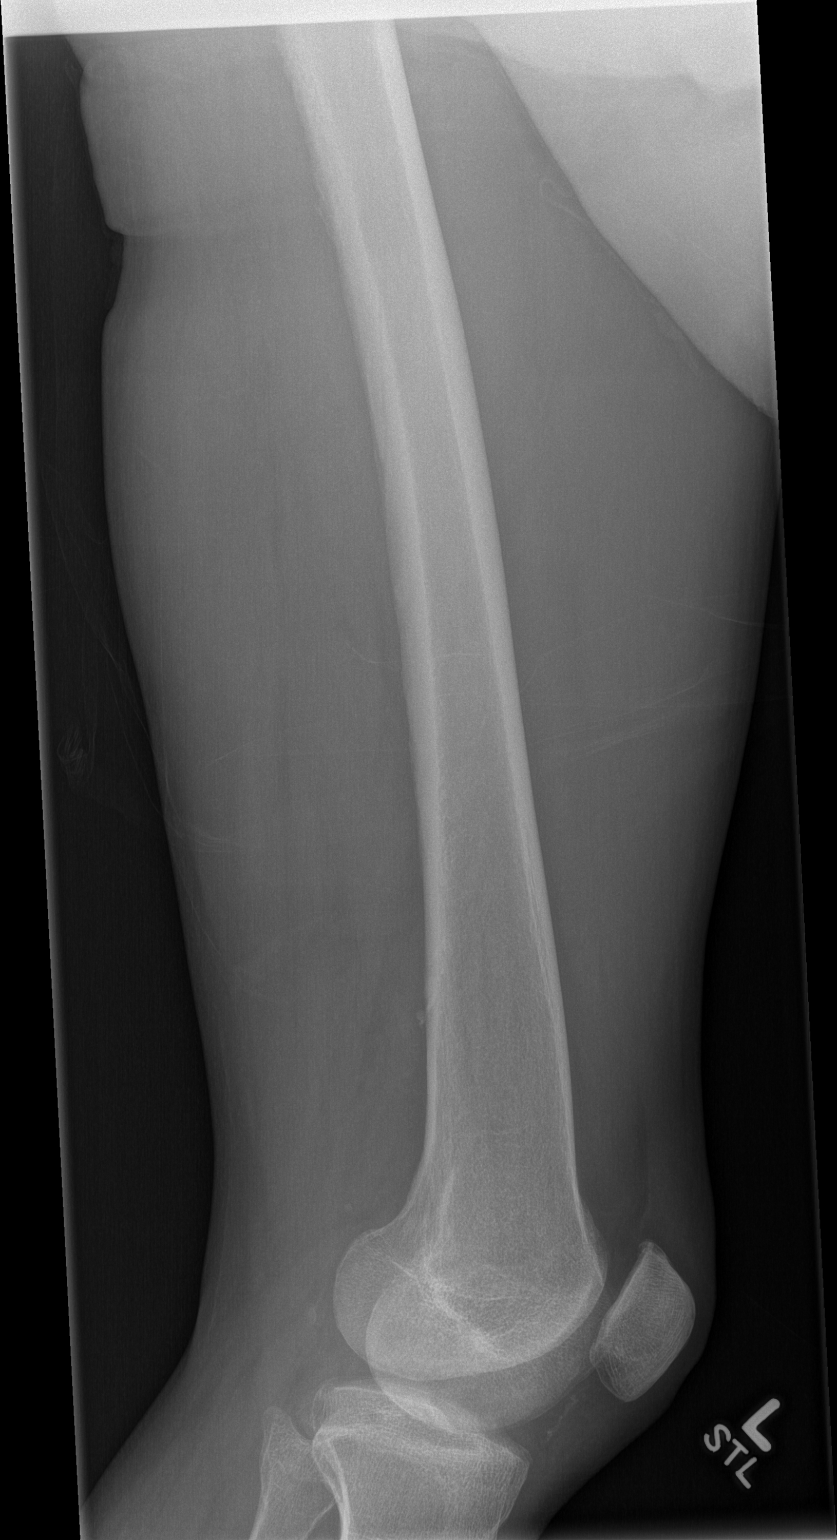

[t femur proximal lat left]
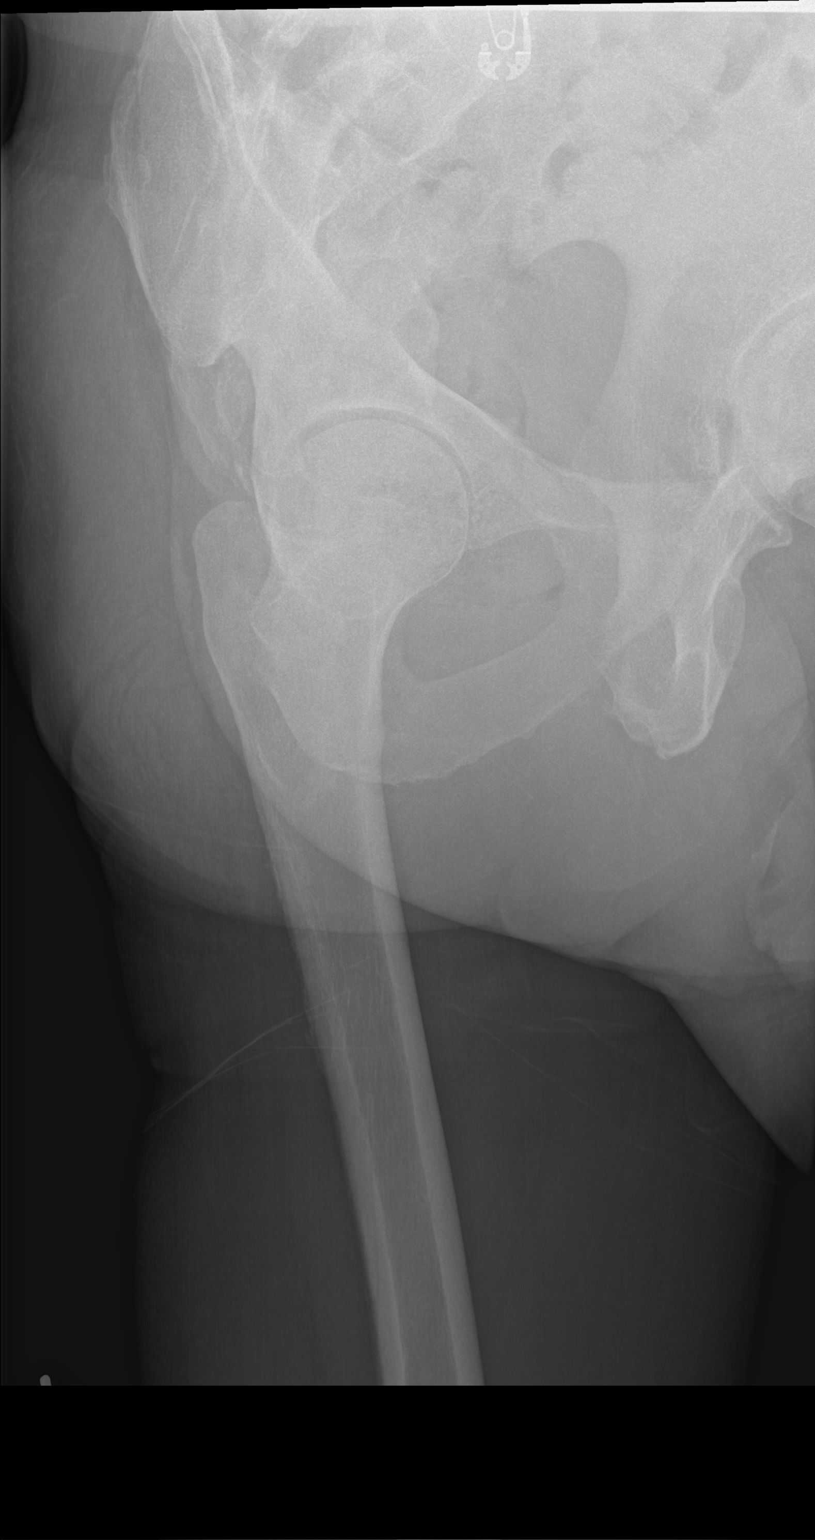

[4 of 4 positions shown; findings below may reference images not displayed]

FINDINGS: No significant sclerotic or lytic lesions are observed in the LEFT
femur. There is no fracture. The adjacent LEFT ischium demonstrates
sclerosis, similar to the distribution noted on previous nuclear
medicine scintigraphy scan. Soft tissue calcification adjacent to
the lesser trochanter.
IMPRESSION: No visible LEFT femur metastases or acute findings.
